# Patient Record
Sex: Male | Born: 1960 | Race: White | Hispanic: No | Marital: Married | Smoking: Current every day smoker
Health system: Southern US, Community
[De-identification: ages and names within clinical notes are randomized; demographics above are authoritative.]

## PROBLEM LIST (undated history)

## (undated) DIAGNOSIS — Z8489 Family history of other specified conditions: Secondary | ICD-10-CM

## (undated) DIAGNOSIS — Z87442 Personal history of urinary calculi: Secondary | ICD-10-CM

## (undated) DIAGNOSIS — K219 Gastro-esophageal reflux disease without esophagitis: Secondary | ICD-10-CM

## (undated) DIAGNOSIS — M1990 Unspecified osteoarthritis, unspecified site: Secondary | ICD-10-CM

## (undated) HISTORY — PX: TENDON REPAIR: SHX5111

## (undated) HISTORY — PX: FACIAL LACERATION REPAIR: SHX6589

---

## 2001-05-15 ENCOUNTER — Emergency Department (HOSPITAL_COMMUNITY): Admission: EM | Admit: 2001-05-15 | Discharge: 2001-05-15 | Payer: Self-pay | Admitting: Emergency Medicine

## 2003-05-09 ENCOUNTER — Emergency Department (HOSPITAL_COMMUNITY): Admission: EM | Admit: 2003-05-09 | Discharge: 2003-05-09 | Payer: Self-pay | Admitting: Emergency Medicine

## 2003-10-30 ENCOUNTER — Emergency Department (HOSPITAL_COMMUNITY): Admission: EM | Admit: 2003-10-30 | Discharge: 2003-10-30 | Payer: Self-pay | Admitting: Emergency Medicine

## 2003-11-13 ENCOUNTER — Ambulatory Visit (HOSPITAL_COMMUNITY): Admission: RE | Admit: 2003-11-13 | Discharge: 2003-11-13 | Payer: Self-pay | Admitting: Unknown Physician Specialty

## 2003-12-08 ENCOUNTER — Encounter (HOSPITAL_COMMUNITY): Admission: RE | Admit: 2003-12-08 | Discharge: 2004-01-07 | Payer: Self-pay | Admitting: Orthopedic Surgery

## 2004-02-15 ENCOUNTER — Ambulatory Visit (HOSPITAL_BASED_OUTPATIENT_CLINIC_OR_DEPARTMENT_OTHER): Admission: RE | Admit: 2004-02-15 | Discharge: 2004-02-15 | Payer: Self-pay | Admitting: Orthopedic Surgery

## 2004-02-15 ENCOUNTER — Ambulatory Visit (HOSPITAL_COMMUNITY): Admission: RE | Admit: 2004-02-15 | Discharge: 2004-02-15 | Payer: Self-pay | Admitting: Orthopedic Surgery

## 2004-03-06 ENCOUNTER — Encounter (HOSPITAL_COMMUNITY): Admission: RE | Admit: 2004-03-06 | Discharge: 2004-04-05 | Payer: Self-pay | Admitting: Orthopedic Surgery

## 2004-09-26 ENCOUNTER — Emergency Department (HOSPITAL_COMMUNITY): Admission: EM | Admit: 2004-09-26 | Discharge: 2004-09-27 | Payer: Self-pay | Admitting: *Deleted

## 2007-03-17 ENCOUNTER — Ambulatory Visit: Payer: Self-pay | Admitting: Gastroenterology

## 2007-11-10 DIAGNOSIS — Z87442 Personal history of urinary calculi: Secondary | ICD-10-CM

## 2007-11-10 DIAGNOSIS — R12 Heartburn: Secondary | ICD-10-CM

## 2007-11-10 DIAGNOSIS — R630 Anorexia: Secondary | ICD-10-CM

## 2008-06-20 ENCOUNTER — Ambulatory Visit (HOSPITAL_COMMUNITY): Admission: RE | Admit: 2008-06-20 | Discharge: 2008-06-20 | Payer: Self-pay | Admitting: Sports Medicine

## 2009-07-26 ENCOUNTER — Emergency Department (HOSPITAL_COMMUNITY): Admission: EM | Admit: 2009-07-26 | Discharge: 2009-07-26 | Payer: Self-pay | Admitting: Family Medicine

## 2010-04-28 ENCOUNTER — Emergency Department (HOSPITAL_COMMUNITY): Admission: EM | Admit: 2010-04-28 | Discharge: 2010-04-28 | Payer: Self-pay | Admitting: Family Medicine

## 2010-12-17 NOTE — Assessment & Plan Note (Signed)
[facility]                         GASTROENTEROLOGY OFFICE NOTE   Marcus Hayes, Marcus Hayes                       MRN:          [id number]  DATE:03/17/2007                            DOB:          1961/06/06    PROBLEM:  Early satiety and anorexia.   REASON:  Marcus Hayes is a 50 year old white male referred through the  courtesy of Dr. Nobie Putnam for evaluation.  For approximately a 2 week  period, 2 weeks ago he was experiencing profound anorexia.  He was  complaining of early satiety.  Despite this, weight was stable.  Over  the last week he feels his symptoms are improving.  He is without nausea  or abdominal pain.  He is moving his bowels regularly.  He does  experience frequent pyrosis, especially at night.  This has been a  problem for years, for which he takes over-the-counter Pepcid.  He is on  no gastric irritants including nonsteroidals.  He denies dysphagia.  He  denies hoarseness, cough, sore throat or shortness of breath.   PAST MEDICAL HISTORY:  Pertinent for kidney stones.   FAMILY HISTORY:  Noncontributory.   He is on no medications.  There is no allergies.  He smokes 1/2 pack a  day and has about 2 beers a day.  He is married and works in an [employer].   REVIEW OF SYSTEMS:  Is pertinent for back pain.   PHYSICAL EXAMINATION:  Pulse 92, blood pressure 124/86, weight 233.  HEENT:  EOMI. PERRLA. Sclerae are anicteric.  Conjunctivae are pink.  NECK:  Supple without thyromegaly, adenopathy or carotid bruits.  CHEST:  Clear to auscultation and percussion without adventitious  sounds.  CARDIAC:  Regular rhythm; normal S1 S2.  There are no murmurs, gallops  or rubs.  ABDOMEN:  Bowel sounds are normoactive.  Abdomen is soft, non-tender and  non-distended.  There are no abdominal masses, tenderness, splenic  enlargement or hepatomegaly.  EXTREMITIES:  Full range of motion.  No cyanosis, clubbing or edema.  RECTAL:  Deferred.   IMPRESSION:  Two week period of anorexia and early satiety.  I suspect  symptoms are related to chronic gastroesophageal reflux.   RECOMMENDATION:  1. Begin omeprazole 20 mg a day.  2. Upper endoscopy to rule out Barrett's esophagus.     Barbette Hair. Arlyce Dice, MD,FACG  Electronically Signed    RDK/MedQ  DD: 03/17/2007  DT: 03/18/2007  Job #: [id number]   cc:   Patrica Duel, M.D.

## 2010-12-17 NOTE — Letter (Signed)
March 17, 2007    Marcus Hayes   RE:  REINHART, SAULTERS  MRN:  [id number]  /  DOB:  1961-08-02   Dear Mr. Marcus Hayes:   It is my pleasure to have treated you recently as a new patient in my  office.  I appreciate your confidence and the opportunity to participate  in your care.   Since I do have a busy inpatient endoscopy schedule and office schedule,  my office hours vary weekly.  I am, however, available for emergency  calls every day through my office.  If I cannot promptly meet an urgent  office appointment, another one of our gastroenterologists will be able  to assist you.   My well-trained staff are prepared to help you at all times.  For  emergencies after office hours, a physician from our gastroenterology  section is always available through my 24-hour answering service.   While you are under my care, I encourage discussion of your questions  and concerns, and I will be happy to return your calls as soon as I am  available.   Once again, I welcome you as a new patient and I look forward to a happy  and healthy relationship.    Sincerely,      Barbette Hair. Arlyce Dice, MD,FACG  Electronically Signed   RDK/MedQ  DD: 03/17/2007  DT: 03/18/2007  Job #: [id number]

## 2010-12-17 NOTE — Letter (Signed)
March 17, 2007    Patrica Duel, M.D.  [address], [address]  [place], [place] [address]   RE:  Marcus Hayes, Marcus Hayes  MRN:  [id number]  /  DOB:  11/10/1960   Dear Dr. Nobie Putnam:   Upon your kind referral, I had the pleasure of evaluating your patient  and I am pleased to offer my findings.  I saw Mr. Rutigliano in the office  today.  Enclosed is a copy of my progress note that details my findings  and recommendations.   Thank you for the opportunity to participate in your patient's care.    Sincerely,      Barbette Hair. Arlyce Dice, MD,FACG  Electronically Signed    RDK/MedQ  DD: 03/17/2007  DT: 03/18/2007  Job #: [id number]

## 2010-12-20 NOTE — Op Note (Signed)
NAME:  Marcus Hayes, Marcus Hayes                          ACCOUNT NO.:  [id number]   MEDICAL RECORD NO.:  [id number]                   PATIENT TYPE:  AMB   LOCATION:  DSC                                  FACILITY:  [facility]   PHYSICIAN:  Loreta Ave, M.D.              DATE OF BIRTH:  [birth year]   DATE OF PROCEDURE:  02/15/2004  DATE OF DISCHARGE:                                 OPERATIVE REPORT   PREOPERATIVE DIAGNOSES:  Post traumatic lateral epicondylitis with tearing,  extensor carpi radialis brevis tendon.   POSTOPERATIVE DIAGNOSES:  Post traumatic lateral epicondylitis with tearing,  extensor carpi radialis brevis tendon.   OPERATION PERFORMED:  Exploration and extensive debridement, extensor carpi  radialis brevis tendon, right elbow.  Debridement drilling, lateral  epicondyle and then primary repair extensor carpi radialis brevis to  superficial extensors.   SURGEON:  Loreta Ave, M.D.   ASSISTANT:  Arlys John D. Petrarca, P.A.-C.   ANESTHESIA:  General.   ESTIMATED BLOOD LOSS:  Minimal.   SPECIMENS:  None.   CULTURES:  None.   COMPLICATIONS:  None.   DRESSING:  Soft compressive with sugar tong splint.   TOURNIQUET TIME:  45 minutes.   DESCRIPTION OF PROCEDURE:  The patient was brought to the operating room and  placed on the operating table in supine position.  After adequate anesthesia  had been obtained, tourniquet applied to the upper aspect of the right arm.  Prepped and draped in the usual sterile fashion.  Exsanguinated with  elevation and Esmarch.  Tourniquet inflated to 250 mmHg.  Longitudinal  incision from the epicondyle extending distally.  Skin and subcutaneous  tissue divided.  Some superficial inflammatory bursal material debrided.  Superficial extensors intact and opened longitudinally from the epicondyle  down to the level of the radial head.  Extensor carpi radialis brevis tendon  exposed.  Marked mucinous degeneration and tearing over the proximal 1.5  cm.  All of the irregular nonfunctional tissue completely debrided.  The  underlying capsule was degenerative as well.  Superficial extensors debrided  on the bottom surface but were left intact.  The epicondyle was then  debrided and treated with multiple drilling for later healing.  Wound  irrigated.  The joint exposed and was intact without chondral changes.  The  superficial extensors were then repaired side-to-side with Vicryl  incorporating the ECRB tendon to the undersurface of the level of the  radial head.  Skin and subcutaneous tissue were closed with Vicryl.  Margins  of the wound injected with Marcaine.  Steri-Strips applied.  Sterile  compressive dressing applied.  Sugar tong splint applied.  Tourniquet  deflated and removed.  Anesthesia reversed.  Brought to recovery room.  Tolerated surgery well without complication.  Loreta Ave, M.D.    DFM/MEDQ  D:  02/15/2004  T:  02/15/2004  Job:  [id number]

## 2011-02-21 ENCOUNTER — Emergency Department (HOSPITAL_COMMUNITY)
Admission: EM | Admit: 2011-02-21 | Discharge: 2011-02-22 | Disposition: A | Discharge status: Home / Self Care | Payer: 59 | Attending: Emergency Medicine | Admitting: Emergency Medicine

## 2011-02-21 ENCOUNTER — Encounter: Payer: Self-pay | Admitting: *Deleted

## 2011-02-21 DIAGNOSIS — K0889 Other specified disorders of teeth and supporting structures: Secondary | ICD-10-CM

## 2011-02-21 DIAGNOSIS — K089 Disorder of teeth and supporting structures, unspecified: Secondary | ICD-10-CM | POA: Insufficient documentation

## 2011-02-21 DIAGNOSIS — F17200 Nicotine dependence, unspecified, uncomplicated: Secondary | ICD-10-CM | POA: Insufficient documentation

## 2011-02-21 NOTE — ED Notes (Signed)
Called x 1 with no answer to go to room 19

## 2011-02-21 NOTE — ED Notes (Signed)
Pt c/o upper left toothache pain x 2 days

## 2011-02-22 MED ORDER — IBUPROFEN 800 MG PO TABS: 800.0000 mg | ORAL_TABLET | Freq: Three times a day (TID) | ORAL | Status: AC

## 2011-02-22 MED ORDER — IBUPROFEN 800 MG PO TABS
800.0000 mg | ORAL_TABLET | Freq: Once | ORAL | Status: AC
  Administered 2011-02-22: 800 mg via ORAL
  Filled 2011-02-22: qty 1

## 2011-02-22 MED ORDER — HYDROCODONE-ACETAMINOPHEN 5-325 MG PO TABS: 1.0000 | ORAL_TABLET | ORAL | Status: AC | PRN

## 2011-02-22 MED ORDER — PENICILLIN V POTASSIUM 250 MG PO TABS
500.0000 mg | ORAL_TABLET | Freq: Once | ORAL | Status: AC
  Administered 2011-02-22: 500 mg via ORAL
  Filled 2011-02-22: qty 2

## 2011-02-22 MED ORDER — PENICILLIN V POTASSIUM 500 MG PO TABS: 500.0000 mg | ORAL_TABLET | Freq: Four times a day (QID) | ORAL | Status: AC

## 2011-02-22 MED ORDER — HYDROCODONE-ACETAMINOPHEN 5-325 MG PO TABS
2.0000 | ORAL_TABLET | Freq: Once | ORAL | Status: AC
  Administered 2011-02-22: 2 via ORAL
  Filled 2011-02-22: qty 2

## 2011-02-22 NOTE — ED Provider Notes (Signed)
History     Chief Complaint  Patient presents with  . Dental Pain   HPI Comments: Patient with tooth to the upper left with large filling that came out about 2 months ago. Has had pain intermittently which became worse over the last two days. Tonight pain and swelling became worse, no drainage. Throbbing pain localized to the tooth and gums. Denies fever, chills. Took 400 mg ibuprofen at 6 pm. Pain is a 7/10 down from a 10/10 earlier in the evening.  Patient is a 50 y.o. male presenting with tooth pain. The history is provided by the patient.  Dental PainThe primary symptoms include mouth pain and dental injury. The symptoms began 12 to 24 hours ago. The symptoms are worsening. The symptoms occur constantly.  Affected teeth include: 15/left upper second molar.  Additional symptoms include: dental sensitivity to temperature, gum swelling and facial swelling.    History reviewed. No pertinent past medical history.  Past Surgical History  Procedure Date  . Tendon repair     History reviewed. No pertinent family history.  History  Substance Use Topics  . Smoking status: Current Some Day Smoker  . Smokeless tobacco: Not on file  . Alcohol Use: No      Review of Systems  HENT: Positive for facial swelling.   All other systems reviewed and are negative.    Physical Exam  BP 143/80  Pulse 84  Temp(Src) 97.9 F (36.6 C) (Oral)  Resp 20  Ht 6' (1.829 m)  Wt 220 lb (99.791 kg)  BMI 29.84 kg/m2  SpO2 96%  Physical Exam  Nursing note and vitals reviewed. Constitutional: He is oriented to person, place, and time. He appears well-developed and well-nourished. He appears distressed.  HENT:  Head: Normocephalic and atraumatic.       Left upper molar with swelling to gum, possible early abscess. No drainage. Cavity area blackened.  Eyes: EOM are normal. Pupils are equal, round, and reactive to light.  Neck: Normal range of motion. Neck supple.  Cardiovascular: Normal rate and  normal heart sounds.   Pulmonary/Chest: Effort normal and breath sounds normal.  Neurological: He is alert and oriented to person, place, and time.  Skin: Skin is warm and dry.    ED Course  Procedures  MDM       Nicoletta Dress. Colon Branch, MD 02/22/11 [id number]

## 2011-02-22 NOTE — Progress Notes (Signed)
Patient with improved pain. Ready for discharge.

## 2011-10-02 ENCOUNTER — Emergency Department (HOSPITAL_COMMUNITY)
Admission: EM | Admit: 2011-10-02 | Discharge: 2011-10-02 | Disposition: A | Discharge status: Home / Self Care | Payer: 59 | Source: Home / Self Care | Attending: Family Medicine | Admitting: Family Medicine

## 2011-10-02 ENCOUNTER — Encounter (HOSPITAL_COMMUNITY): Payer: Self-pay | Admitting: Emergency Medicine

## 2011-10-02 DIAGNOSIS — J40 Bronchitis, not specified as acute or chronic: Secondary | ICD-10-CM

## 2011-10-02 MED ORDER — AZITHROMYCIN 250 MG PO TABS: 250.0000 mg | ORAL_TABLET | Freq: Every day | ORAL | Status: AC

## 2011-10-02 MED ORDER — BENZONATATE 100 MG PO CAPS: 100.0000 mg | ORAL_CAPSULE | Freq: Three times a day (TID) | ORAL | Status: DC

## 2011-10-02 MED ORDER — GUAIFENESIN-CODEINE 100-10 MG/5ML PO SYRP: 5.0000 mL | ORAL_SOLUTION | Freq: Four times a day (QID) | ORAL | Status: DC | PRN

## 2011-10-02 NOTE — ED Notes (Signed)
Onset 09/25/2011 of symptoms: coughing, chest congestion, sore throat, fever minimal last week.  Reports yellow/brown phlegm, minimal amounts.

## 2011-10-02 NOTE — ED Provider Notes (Signed)
History     CSN: [id number]  Arrival date & time 10/02/11  1515   First MD Initiated Contact with Patient 10/02/11 1516      Chief Complaint  Patient presents with  . URI    (Consider location/radiation/quality/duration/timing/severity/associated sxs/prior treatment) HPI Comments: Onset 1 wk with first nasal congestion that has now moved to his chest. Cough is non productive and worse at night , keeping him awake. Fever initially but resolved. No n/v. Taking otc meds with minimal relief.   The history is provided by the patient.    History reviewed. No pertinent past medical history.  Past Surgical History  Procedure Date  . Tendon repair     History reviewed. No pertinent family history.  History  Substance Use Topics  . Smoking status: Current Some Day Smoker  . Smokeless tobacco: Not on file  . Alcohol Use: No      Review of Systems  Constitutional: Negative.   HENT: Negative.   Respiratory: Positive for cough and wheezing.   Cardiovascular: Negative.   Gastrointestinal: Negative.   Genitourinary: Negative.   Musculoskeletal: Negative.     Allergies  Review of patient's allergies indicates no known allergies.  Home Medications   Current Outpatient Rx  Name Route Sig Dispense Refill  . DEXTROMETHORPHAN POLISTIREX ER 30 MG/5ML PO LQCR Oral Take 60 mg by mouth as needed.    Marland Kitchen ALKA-SELTZER PLUS DAY COLD/FLU PO Oral Take by mouth.    . GUAIFENESIN ER 600 MG PO TB12 Oral Take 1,200 mg by mouth 2 (two) times daily.    Marland Kitchen OVER THE COUNTER MEDICATION  Took old antibiotics.  Took amoxicillin one tablet twice each day.  Took this Thursday and Friday of last week    . NYQUIL PO Oral Take by mouth.    . AZITHROMYCIN 250 MG PO TABS Oral Take 1 tablet (250 mg total) by mouth daily. Take first 2 tablets together, then 1 every day until finished. 6 tablet 0  . BENZONATATE 100 MG PO CAPS Oral Take 1 capsule (100 mg total) by mouth every 8 (eight) hours. 21 capsule 0  .  GUAIFENESIN-CODEINE 100-10 MG/5ML PO SYRP Oral Take 5 mLs by mouth every 6 (six) hours as needed for cough. 120 mL 0    BP 133/92  Pulse 80  Temp(Src) 98 F (36.7 C) (Oral)  Resp 18  SpO2 96%  Physical Exam  Nursing note and vitals reviewed. Constitutional: He appears well-developed and well-nourished. No distress.  HENT:  Nose: Nose normal.  Mouth/Throat: Oropharynx is clear and moist.  Neck: Normal range of motion. Neck supple.  Cardiovascular: Normal rate and regular rhythm.   Pulmonary/Chest: Effort normal and breath sounds normal. He has no wheezes.  Lymphadenopathy:    He has no cervical adenopathy.  Skin: Skin is warm and dry.    ED Course  Procedures (including critical care time)  Labs Reviewed - No data to display No results found.   1. Bronchitis       MDM          Randa Spike, MD 10/02/11 1710

## 2011-10-02 NOTE — Discharge Instructions (Signed)
Tylenol or motrin as needed. Avoid caffeine and milk products. Avoid smoking . Follow up with your pcp or return if symptoms persist or worsen. Bronchitis Bronchitis is a problem of the air tubes leading to your lungs. This problem makes it hard for air to get in and out of the lungs. You may cough a lot because your air tubes are narrow. Going without care can cause lasting (chronic) bronchitis. HOME CARE   Drink enough fluids to keep your pee (urine) clear or pale yellow.   Use a cool mist humidifier.   Quit smoking if you smoke. If you keep smoking, the bronchitis might not get better.   Only take medicine as told by your doctor.  GET HELP RIGHT AWAY IF:   Coughing keeps you awake.   You start to wheeze.   You become more sick or weak.   You have a hard time breathing or get short of breath.   You cough up blood.   Coughing lasts more than 2 weeks.   You have a fever.   Your baby is older than 3 months with a rectal temperature of 102 F (38.9 C) or higher.   Your baby is 60 months old or younger with a rectal temperature of 100.4 F (38 C) or higher.  MAKE SURE YOU:  Understand these instructions.   Will watch your condition.   Will get help right away if you are not doing well or get worse.  Document Released: 01/07/2008 Document Revised: 04/02/2011 Document Reviewed: 06/22/2009 [facility] Patient Information 2012 [employer], [place].

## 2011-10-02 NOTE — ED Notes (Signed)
Late entry.5:10 p.m.  Called in medications to [facility] .  Spoke to [place] in pharmacy.  #1 z pac, #2 cheratussin ac, 6 ounces, 1-2 tsp every 6 hours, #3 tessalon pearles #20 , 200mg  every 12 hours as needed for cough.

## 2011-10-09 ENCOUNTER — Encounter (HOSPITAL_COMMUNITY): Payer: Self-pay | Admitting: Emergency Medicine

## 2011-10-09 ENCOUNTER — Emergency Department (HOSPITAL_COMMUNITY)
Admission: EM | Admit: 2011-10-09 | Discharge: 2011-10-09 | Disposition: A | Discharge status: Home Health | Payer: 59 | Source: Home / Self Care

## 2011-10-09 DIAGNOSIS — M109 Gout, unspecified: Secondary | ICD-10-CM

## 2011-10-09 MED ORDER — NAPROXEN 500 MG PO TABS: 500.0000 mg | ORAL_TABLET | Freq: Two times a day (BID) | ORAL | Status: AC

## 2011-10-09 MED ORDER — COLCHICINE 0.6 MG PO TABS: 0.6000 mg | ORAL_TABLET | Freq: Two times a day (BID) | ORAL | Status: DC

## 2011-10-09 NOTE — ED Provider Notes (Signed)
History     CSN: [id number]  Arrival date & time 10/09/11  0910   None     Chief Complaint  Patient presents with  . Gout    (Consider location/radiation/quality/duration/timing/severity/associated sxs/prior treatment) HPI Comments: Patient presents today with c/o gout flare up of his Rt great toe that began yesterday. He has a hx of gout that began approx 3 yrs ago. He has had prescriptions of colchicine and Naprosyn that he takes prn when he begins to notice discomfort. He states often with just one dose of each medications it takes the pain away. He currently does not have a PCP, but has an upcoming appt to establish care with a new Dr. He does not drink alcohol, and denies change in diet.    Past Medical History  Diagnosis Date  . Gout     Past Surgical History  Procedure Date  . Tendon repair     History reviewed. No pertinent family history.  History  Substance Use Topics  . Smoking status: Current Some Day Smoker  . Smokeless tobacco: Not on file  . Alcohol Use: No      Review of Systems  Constitutional: Negative for fever and chills.  Musculoskeletal: Positive for joint swelling.  Skin: Positive for color change. Negative for wound.  Neurological: Negative for numbness.    Allergies  Review of patient's allergies indicates no known allergies.  Home Medications   Current Outpatient Rx  Name Route Sig Dispense Refill  . COLCHICINE 0.6 MG PO TABS Oral Take 1 tablet (0.6 mg total) by mouth 2 (two) times daily. 30 tablet 0  . NAPROXEN 500 MG PO TABS Oral Take 1 tablet (500 mg total) by mouth 2 (two) times daily. 30 tablet 0    BP 111/77  Pulse 84  Temp(Src) 97.8 F (36.6 C) (Oral)  Resp 18  SpO2 97%  Physical Exam  Constitutional: He appears well-developed and well-nourished. No distress.  HENT:  Head: Normocephalic and atraumatic.  Right Ear: Tympanic membrane, external ear and ear canal normal.  Left Ear: Tympanic membrane, external ear and ear  canal normal.  Nose: Nose normal.  Mouth/Throat: Uvula is midline, oropharynx is clear and moist and mucous membranes are normal. No oropharyngeal exudate, posterior oropharyngeal edema or posterior oropharyngeal erythema.  Neck: Neck supple.  Cardiovascular: Normal rate, regular rhythm and normal heart sounds.   Pulses:      Dorsalis pedis pulses are 2+ on the right side.       Posterior tibial pulses are 2+ on the right side.  Pulmonary/Chest: Effort normal and breath sounds normal. No respiratory distress.  Musculoskeletal:       Right foot: He exhibits tenderness and swelling. He exhibits normal capillary refill and no deformity.       Feet:  Lymphadenopathy:    He has no cervical adenopathy.  Neurological: He is alert.  Skin: Skin is warm and dry.  Psychiatric: He has a normal mood and affect.    ED Course  Procedures (including critical care time)   Labs Reviewed  URIC ACID   No results found.   1. Gout attack       MDM  Discussed with pt to f/u with new PCP regarding gout mgmt and may need preventive treatment.         Melody Comas, [place] 10/09/11 1108

## 2011-10-09 NOTE — ED Notes (Signed)
Here with gout flare up in right foot great toe that started yesterday.pain with movement only .stabbng,sharp pain.pt states last flare up 2010.ran out of COLCHINE 0.6MG  TAB AND NAPROSYN 500MG 

## 2011-10-09 NOTE — Discharge Instructions (Signed)
Gout Gout is an inflammatory condition (arthritis) caused by a buildup of uric acid crystals in the joints. Uric acid is a chemical that is normally present in the blood. Under some circumstances, uric acid can form into crystals in your joints. This causes joint redness, soreness, and swelling (inflammation). Repeat attacks are common. Over time, uric acid crystals can form into masses (tophi) near a joint, causing disfigurement. Gout is treatable and often preventable. CAUSES  The disease begins with elevated levels of uric acid in the blood. Uric acid is produced by your body when it breaks down a naturally found substance called purines. This also happens when you eat certain foods such as meats and fish. Causes of an elevated uric acid level include:  Being passed down from parent to child (heredity).   Diseases that cause increased uric acid production (obesity, psoriasis, some cancers).   Excessive alcohol use.   Diet, especially diets rich in meat and seafood.   Medicines, including certain cancer-fighting drugs (chemotherapy), diuretics, and aspirin.   Chronic kidney disease. The kidneys are no longer able to remove uric acid well.   Problems with metabolism.  Conditions strongly associated with gout include:  Obesity.   High blood pressure.   High cholesterol.   Diabetes.  Not everyone with elevated uric acid levels gets gout. It is not understood why some people get gout and others do not. Surgery, joint injury, and eating too much of certain foods are some of the factors that can lead to gout. SYMPTOMS   An attack of gout comes on quickly. It causes intense pain with redness, swelling, and warmth in a joint.   Fever can occur.   Often, only one joint is involved. Certain joints are more commonly involved:   Base of the big toe.   Knee.   Ankle.   Wrist.   Finger.  Without treatment, an attack usually goes away in a few days to weeks. Between attacks, you  usually will not have symptoms, which is different from many other forms of arthritis. DIAGNOSIS  Your caregiver will suspect gout based on your symptoms and exam. Removal of fluid from the joint (arthrocentesis) is done to check for uric acid crystals. Your caregiver will give you a medicine that numbs the area (local anesthetic) and use a needle to remove joint fluid for exam. Gout is confirmed when uric acid crystals are seen in joint fluid, using a special microscope. Sometimes, blood, urine, and X-ray tests are also used. TREATMENT  There are 2 phases to gout treatment: treating the sudden onset (acute) attack and preventing attacks (prophylaxis). Treatment of an Acute Attack  Medicines are used. These include anti-inflammatory medicines or steroid medicines.   An injection of steroid medicine into the affected joint is sometimes necessary.   The painful joint is rested. Movement can worsen the arthritis.   You may use warm or cold treatments on painful joints, depending which works best for you.   Discuss the use of coffee, vitamin C, or cherries with your caregiver. These may be helpful treatment options.  Treatment to Prevent Attacks After the acute attack subsides, your caregiver may advise prophylactic medicine. These medicines either help your kidneys eliminate uric acid from your body or decrease your uric acid production. You may need to stay on these medicines for a very long time. The early phase of treatment with prophylactic medicine can be associated with an increase in acute gout attacks. For this reason, during the first few months   of treatment, your caregiver may also advise you to take medicines usually used for acute gout treatment. Be sure you understand your caregiver's directions. You should also discuss dietary treatment with your caregiver. Certain foods such as meats and fish can increase uric acid levels. Other foods such as dairy can decrease levels. Your caregiver  can give you a list of foods to avoid. HOME CARE INSTRUCTIONS   Do not take aspirin to relieve pain. This raises uric acid levels.   Only take over-the-counter or prescription medicines for pain, discomfort, or fever as directed by your caregiver.   Rest the joint as much as possible. When in bed, keep sheets and blankets off painful areas.   Keep the affected joint raised (elevated).   Use crutches if the painful joint is in your leg.   Drink enough water and fluids to keep your urine clear or pale yellow. This helps your body get rid of uric acid. Do not drink alcoholic beverages. They slow the passage of uric acid.   Follow your caregiver's dietary instructions. Pay careful attention to the amount of protein you eat. Your daily diet should emphasize fruits, vegetables, whole grains, and fat-free or low-fat milk products.   Maintain a healthy body weight.  SEEK MEDICAL CARE IF:   You have an oral temperature above 102 F (38.9 C).   You develop diarrhea, vomiting, or any side effects from medicines.   You do not feel better in 24 hours, or you are getting worse.  SEEK IMMEDIATE MEDICAL CARE IF:   Your joint becomes suddenly more tender and you have:   Chills.   An oral temperature above 102 F (38.9 C), not controlled by medicine.  MAKE SURE YOU:   Understand these instructions.   Will watch your condition.   Will get help right away if you are not doing well or get worse.  Document Released: 07/18/2000 Document Revised: 07/10/2011 Document Reviewed: 10/29/2009 [employer] Patient Information 2012 [employer]. 

## 2011-10-10 ENCOUNTER — Telehealth (HOSPITAL_COMMUNITY): Payer: Self-pay | Admitting: *Deleted

## 2011-10-10 NOTE — ED Notes (Signed)
Pt. verified x 2 and given results. Pt. notified that this indicates he has gout. States he has had gout before. I said the doctors usually recommend that he f/u with a PCP for long term tx. I asked if he has a PCP. He said his wife just got one for him. He wrote down the result and said he would tell his PCP about it. Vassie Moselle 10/10/2011

## 2011-10-23 NOTE — ED Provider Notes (Signed)
Medical screening examination/treatment/procedure(s) were performed by non-physician practitioner and as supervising physician I was immediately available for consultation/collaboration.  Keygan Dumond G  D.O.    Randa Spike, MD 10/23/11 [phone]

## 2012-11-13 ENCOUNTER — Encounter (HOSPITAL_COMMUNITY): Payer: Self-pay | Admitting: *Deleted

## 2012-11-13 ENCOUNTER — Emergency Department (INDEPENDENT_AMBULATORY_CARE_PROVIDER_SITE_OTHER)
Admission: EM | Admit: 2012-11-13 | Discharge: 2012-11-13 | Disposition: A | Discharge status: Home / Self Care | Payer: Commercial Managed Care - PPO | Source: Home / Self Care | Attending: Family Medicine | Admitting: Family Medicine

## 2012-11-13 DIAGNOSIS — L259 Unspecified contact dermatitis, unspecified cause: Secondary | ICD-10-CM

## 2012-11-13 MED ORDER — FLUTICASONE PROPIONATE 0.05 % EX CREA: TOPICAL_CREAM | Freq: Two times a day (BID) | CUTANEOUS | Status: DC

## 2012-11-13 NOTE — ED Notes (Signed)
Pt  Has  A  Rash  l  Lower  Leg          X  2  Weeks   Rash  Itches            Small  Red   Areas   Present       No    Known  Causative  Agent         denys  Any  New  Medications  Or  Any  injury

## 2012-11-13 NOTE — ED Provider Notes (Signed)
History     CSN: [id number]  Arrival date & time 11/13/12  1425   First MD Initiated Contact with Patient 11/13/12 1429      Chief Complaint  Patient presents with  . Rash    (Consider location/radiation/quality/duration/timing/severity/associated sxs/prior treatment) Patient is a 52 y.o. male presenting with rash. The history is provided by the patient and the spouse.  Rash Location:  Leg Leg rash location:  L lower leg Quality: dryness, itchiness, redness and scaling   Quality: not painful   Severity:  Mild Chronicity:  New Ineffective treatments:  None tried   Past Medical History  Diagnosis Date  . Gout     Past Surgical History  Procedure Laterality Date  . Tendon repair      No family history on file.  History  Substance Use Topics  . Smoking status: Current Some Day Smoker  . Smokeless tobacco: Not on file  . Alcohol Use: Yes      Review of Systems  Constitutional: Negative.   Musculoskeletal: Negative.   Skin: Positive for rash.    Allergies  Review of patient's allergies indicates no known allergies.  Home Medications   Current Outpatient Rx  Name  Route  Sig  Dispense  Refill  . fluticasone (CUTIVATE) 0.05 % cream   Topical   Apply topically 2 (two) times daily.   60 g   0     There were no vitals taken for this visit.  Physical Exam  Nursing note and vitals reviewed. Constitutional: He is oriented to person, place, and time. He appears well-developed and well-nourished.  Musculoskeletal: Normal range of motion.  Neurological: He is alert and oriented to person, place, and time.  Skin: Skin is warm and dry. Rash noted. No pallor.  Dry erythematous pruritic excoriated patch on left ant tibial aspect of lower leg, no infection,  Psychiatric: He has a normal mood and affect.    ED Course  Procedures (including critical care time)  Labs Reviewed - No data to display No results found.   1. Contact dermatitis       MDM           Linna Hoff, MD 11/15/12 1820

## 2012-12-05 ENCOUNTER — Encounter (HOSPITAL_COMMUNITY): Payer: Self-pay

## 2012-12-05 ENCOUNTER — Emergency Department (HOSPITAL_COMMUNITY)
Admission: EM | Admit: 2012-12-05 | Discharge: 2012-12-05 | Disposition: A | Discharge status: Home / Self Care | Payer: Commercial Managed Care - PPO | Attending: Emergency Medicine | Admitting: Emergency Medicine

## 2012-12-05 ENCOUNTER — Emergency Department (HOSPITAL_COMMUNITY): Payer: Commercial Managed Care - PPO

## 2012-12-05 DIAGNOSIS — S20229A Contusion of unspecified back wall of thorax, initial encounter: Secondary | ICD-10-CM | POA: Insufficient documentation

## 2012-12-05 DIAGNOSIS — Z23 Encounter for immunization: Secondary | ICD-10-CM | POA: Insufficient documentation

## 2012-12-05 DIAGNOSIS — W11XXXA Fall on and from ladder, initial encounter: Secondary | ICD-10-CM | POA: Insufficient documentation

## 2012-12-05 DIAGNOSIS — S20221A Contusion of right back wall of thorax, initial encounter: Secondary | ICD-10-CM

## 2012-12-05 DIAGNOSIS — Y929 Unspecified place or not applicable: Secondary | ICD-10-CM | POA: Insufficient documentation

## 2012-12-05 DIAGNOSIS — S93609A Unspecified sprain of unspecified foot, initial encounter: Secondary | ICD-10-CM | POA: Insufficient documentation

## 2012-12-05 DIAGNOSIS — Z862 Personal history of diseases of the blood and blood-forming organs and certain disorders involving the immune mechanism: Secondary | ICD-10-CM | POA: Insufficient documentation

## 2012-12-05 DIAGNOSIS — S51009A Unspecified open wound of unspecified elbow, initial encounter: Secondary | ICD-10-CM | POA: Insufficient documentation

## 2012-12-05 DIAGNOSIS — Y9389 Activity, other specified: Secondary | ICD-10-CM | POA: Insufficient documentation

## 2012-12-05 DIAGNOSIS — S51011A Laceration without foreign body of right elbow, initial encounter: Secondary | ICD-10-CM

## 2012-12-05 DIAGNOSIS — F17200 Nicotine dependence, unspecified, uncomplicated: Secondary | ICD-10-CM | POA: Insufficient documentation

## 2012-12-05 DIAGNOSIS — IMO0002 Reserved for concepts with insufficient information to code with codable children: Secondary | ICD-10-CM | POA: Insufficient documentation

## 2012-12-05 DIAGNOSIS — Z8639 Personal history of other endocrine, nutritional and metabolic disease: Secondary | ICD-10-CM | POA: Insufficient documentation

## 2012-12-05 DIAGNOSIS — S93602A Unspecified sprain of left foot, initial encounter: Secondary | ICD-10-CM

## 2012-12-05 MED ORDER — OXYCODONE-ACETAMINOPHEN 5-325 MG PO TABS: ORAL_TABLET | ORAL | Status: DC

## 2012-12-05 MED ORDER — TETANUS-DIPHTH-ACELL PERTUSSIS 5-2.5-18.5 LF-MCG/0.5 IM SUSP
0.5000 mL | Freq: Once | INTRAMUSCULAR | Status: AC
  Administered 2012-12-05: 0.5 mL via INTRAMUSCULAR
  Filled 2012-12-05: qty 0.5

## 2012-12-05 MED ORDER — LIDOCAINE HCL (PF) 1 % IJ SOLN
INTRAMUSCULAR | Status: AC
  Administered 2012-12-05: 5 mL via INTRADERMAL
  Filled 2012-12-05: qty 5

## 2012-12-05 MED ORDER — OXYCODONE-ACETAMINOPHEN 5-325 MG PO TABS
1.0000 | ORAL_TABLET | Freq: Once | ORAL | Status: AC
  Administered 2012-12-05: 1 via ORAL
  Filled 2012-12-05: qty 1

## 2012-12-05 MED ORDER — LIDOCAINE HCL (PF) 1 % IJ SOLN
5.0000 mL | Freq: Once | INTRAMUSCULAR | Status: AC
  Administered 2012-12-05: 5 mL via INTRADERMAL

## 2012-12-05 MED ORDER — CYCLOBENZAPRINE HCL 10 MG PO TABS: 10.0000 mg | ORAL_TABLET | Freq: Three times a day (TID) | ORAL | Status: DC | PRN

## 2012-12-05 NOTE — ED Provider Notes (Signed)
History     CSN: [id number]  Arrival date & time 12/05/12  1608   First MD Initiated Contact with Patient 12/05/12 1659      Chief Complaint  Patient presents with  . Fall  . Extremity Laceration    (Consider location/radiation/quality/duration/timing/severity/associated sxs/prior treatment) HPI Comments: Patient c/o fall from a ladder that occurred just PTA.  States the ladder slipped and he fell approx 9 ft.  He c/o pain to the left foot, right elbow , and low back.  Also c/o laceration to the right elbow.  He denies head injury, LOC, neck pain, headache, dizziness, vomiting or recent alcohol use.    Patient is a 52 y.o. male presenting with skin laceration. The history is provided by the patient.  Laceration Location:  Shoulder/arm Shoulder/arm laceration location:  R elbow Length (cm):  4 cm Depth:  Cutaneous Quality comment:  Flap Bleeding: controlled   Laceration mechanism:  Unable to specify Pain details:    Quality:  Aching   Severity:  Mild   Timing:  Constant   Progression:  Unchanged Foreign body present:  No foreign bodies Relieved by:  Nothing Worsened by:  Movement Ineffective treatments:  None tried Tetanus status:  Out of date   Past Medical History  Diagnosis Date  . Gout     Past Surgical History  Procedure Laterality Date  . Tendon repair      No family history on file.  History  Substance Use Topics  . Smoking status: Current Some Day Smoker    Types: Cigarettes  . Smokeless tobacco: Not on file  . Alcohol Use: Yes      Review of Systems  Constitutional: Negative for fever and chills.  HENT: Negative for facial swelling, neck pain and neck stiffness.   Eyes: Negative for visual disturbance.  Respiratory: Negative for chest tightness and shortness of breath.   Cardiovascular: Negative for chest pain.  Gastrointestinal: Negative for nausea, vomiting and abdominal pain.  Genitourinary: Negative for dysuria, hematuria, flank pain and  difficulty urinating.  Musculoskeletal: Positive for back pain, joint swelling and arthralgias. Negative for gait problem.  Skin: Positive for wound. Negative for color change.  Neurological: Negative for dizziness, syncope, facial asymmetry, speech difficulty, weakness, light-headedness, numbness and headaches.  Psychiatric/Behavioral: Negative for confusion.  All other systems reviewed and are negative.    Allergies  Review of patient's allergies indicates no known allergies.  Home Medications   Current Outpatient Rx  Name  Route  Sig  Dispense  Refill  . fluticasone (CUTIVATE) 0.05 % cream   Topical   Apply topically 2 (two) times daily.   60 g   0     BP 144/90  Pulse 86  Temp(Src) 98.1 F (36.7 C) (Oral)  Resp 20  Ht 6' (1.829 m)  Wt 240 lb (108.863 kg)  BMI 32.54 kg/m2  SpO2 100%  Physical Exam  Nursing note and vitals reviewed. Constitutional: He is oriented to person, place, and time. He appears well-developed and well-nourished. No distress.  HENT:  Head: Normocephalic and atraumatic.  Eyes: Conjunctivae and EOM are normal. Pupils are equal, round, and reactive to light.  Neck: Normal range of motion. Neck supple.  Cardiovascular: Normal rate, regular rhythm, normal heart sounds and intact distal pulses.   No murmur heard. Pulmonary/Chest: Effort normal and breath sounds normal. No respiratory distress. He exhibits no tenderness.  Abdominal: Soft. He exhibits no distension. There is no tenderness. There is no rebound and no guarding.  Musculoskeletal: He exhibits tenderness. He exhibits no edema.       Right elbow: He exhibits laceration. He exhibits normal range of motion, no swelling, no effusion and no deformity. No tenderness found. No radial head, no medial epicondyle and no lateral epicondyle tenderness noted.       Lumbar back: He exhibits tenderness and pain. He exhibits normal range of motion, no swelling, no deformity, no laceration and normal pulse.         Back:       Left foot: He exhibits tenderness. He exhibits normal range of motion, no bony tenderness, no swelling, normal capillary refill, no crepitus, no deformity and no laceration.  ttp of the lumbar spine and paraspinal muscles.  DP pulse brisk and symmetrical.  Distal sensation intact.  ttp of the lateral left foot.  No bony deformity, abrasion or edema.  superficial lac to right forearm.  Bleeding controlled.  Lac is distal to the joint.  Pt has full ROM of the elbow.  Radial pulse brisk, distal sensation intact  Neurological: He is alert and oriented to person, place, and time. No cranial nerve deficit or sensory deficit. He exhibits normal muscle tone. Coordination and gait normal.  Reflex Scores:      Patellar reflexes are 2+ on the right side and 2+ on the left side.      Achilles reflexes are 2+ on the right side and 2+ on the left side. Skin: Skin is warm and dry.  Psychiatric: His behavior is normal.    ED Course  Procedures (including critical care time)  Labs Reviewed - No data to display Dg Lumbar Spine Complete  12/05/2012  *RADIOLOGY REPORT*  Clinical Data: Fall and lumbar spine pain.  LUMBAR SPINE - COMPLETE 4+ VIEW  Comparison: 06/25/2011  Findings: AP, lateral and oblique images of the lumbar spine were obtained.  Again noted is mild irregularity of the L5 vertebral body which appears chronic.  Otherwise, the vertebral body heights are maintained.  No evidence for a fracture or dislocation.  T12 ribs may be hypoplastic.  IMPRESSION: No acute bony abnormality in the lumbar spine.   Original Report Authenticated By: Richarda Overlie, M.D.    Dg Elbow Complete Right  12/05/2012  *RADIOLOGY REPORT*  Clinical Data: Laceration to the olecranon. Fall from ladder.  RIGHT ELBOW - COMPLETE 3+ VIEW  Comparison: MRI of the elbow 11/13/2003  Findings: No evidence for an elbow joint effusion.  There is a well corticated calcification or ossification near the radial head which appears  chronic. Irregularity of the coronoid process suggests degenerative changes.  Enthesopathic changes involving the olecranon.  There is a lucency involving the enthesopathic changes. Small ossification along the medial aspect of the distal humerus.  IMPRESSION: No evidence for an acute fracture or dislocation.  Scattered calcifications around the elbow.  These most likely represent chronic changes.  No evidence for an elbow effusion.   Original Report Authenticated By: Richarda Overlie, M.D.    Dg Foot Complete Left  12/05/2012  *RADIOLOGY REPORT*  Clinical Data: Fall with foot pain.  LEFT FOOT - COMPLETE 3+ VIEW  Comparison: None.  Findings: There is a small calcaneal spur.  No evidence for a fracture or dislocation.  Normal alignment of the left foot.  IMPRESSION: No acute bony abnormality.   Original Report Authenticated By: Richarda Overlie, M.D.        MDM    LACERATION REPAIR Performed by: Orin Eberwein L. Authorized by: Maxwell Caul Consent: Verbal consent  obtained. Risks and benefits: risks, benefits and alternatives were discussed Consent given by: patient Patient identity confirmed: provided demographic data Prepped and Draped in normal sterile fashion Wound explored  Laceration Location: right elbow Laceration Length: 4 cm  No Foreign Bodies seen or palpated  Anesthesia: local infiltration  Local anesthetic: lidocaine 1% w/o epinephrine  Anesthetic total: 3 ml  Irrigation method: syringe Amount of cleaning: standard  Skin closure: 4-0 ethilon Number of sutures:6 Technique: simple interrupted  Patient tolerance: Patient tolerated the procedure well with no immediate complications.   Lac was bandaged.  TDaP updated.  Pain improved after second percocet   Patient has ttp of the lumbar paraspinal muscles.  No focal neuro deficits on exam.  Ambulates with a steady gait.   No knee pain on exam.  VSS.  Patient is alert and talkative.  Appears stable for d/c.  Advised to return  here for any worsening sx's.  The patient appears reasonably screened and/or stabilized for discharge and I doubt any other medical condition or other [facility] requiring further screening, evaluation, or treatment in the ED at this time prior to discharge.    Kameria Canizares L. Linley Moskal, PA-C 12/08/12 2250

## 2012-12-05 NOTE — ED Notes (Signed)
Pt reports falling backwards off a ladder, pt fell approx 9 feet, now having left foot pain, back pain and laceration to right elbow.  Denies loc.

## 2012-12-12 NOTE — ED Provider Notes (Signed)
Medical screening examination/treatment/procedure(s) were performed by non-physician practitioner and as supervising physician I was immediately available for consultation/collaboration.  Kaytlen Lightsey, MD 12/12/12 1522 

## 2012-12-16 ENCOUNTER — Encounter: Payer: Self-pay | Admitting: Family Medicine

## 2012-12-16 ENCOUNTER — Ambulatory Visit (INDEPENDENT_AMBULATORY_CARE_PROVIDER_SITE_OTHER): Payer: Commercial Managed Care - PPO | Admitting: Family Medicine

## 2012-12-16 VITALS — Wt 240.6 lb

## 2012-12-16 DIAGNOSIS — S20221S Contusion of right back wall of thorax, sequela: Secondary | ICD-10-CM

## 2012-12-16 DIAGNOSIS — S51011S Laceration without foreign body of right elbow, sequela: Secondary | ICD-10-CM

## 2012-12-16 DIAGNOSIS — IMO0002 Reserved for concepts with insufficient information to code with codable children: Secondary | ICD-10-CM

## 2012-12-16 MED ORDER — OXYCODONE-ACETAMINOPHEN 5-325 MG PO TABS: ORAL_TABLET | ORAL | Status: DC

## 2012-12-16 NOTE — Progress Notes (Signed)
  Subjective:    Patient ID: Marcus Hayes, male    DOB: [birth year], 52 y.o.   MRN: [id number]  HPI This gentleman fell off a ladder striking his back his right elbow his left knee and left foot complains of pain and discomfort in all of these areas had a laceration of the right elbow multiple sutures were placed he's here today to have them removed. He denies dizziness nausea vomiting but he does have moderate ache and discomfort in his lower back does not radiate down the leg Past medical history benign   Review of Systems See above denies dizziness denies headaches    Objective:   Physical Exam Lungs are clear hearts regular laceration of the right elbow does appear to be healing a little bit of redness around it but no tenderness or drainage. He does have an enlargement of the right lower spine that is due to bruising       Assessment & Plan:  #1 low back contusion-the bruising will gradually get better will take up to 6 weeks to absorb followup if ongoing troubles #2 right elbow laceration sutures removed without difficulty I taught him what to watch for in terms of infection if that should occur he is to followup #3 he is to do lab work and a wellness checkup in the near future.

## 2014-04-16 ENCOUNTER — Encounter (HOSPITAL_COMMUNITY): Payer: Self-pay | Admitting: Emergency Medicine

## 2014-04-16 ENCOUNTER — Emergency Department (HOSPITAL_COMMUNITY)
Admission: EM | Admit: 2014-04-16 | Discharge: 2014-04-16 | Disposition: A | Discharge status: Home / Self Care | Payer: 59 | Attending: Emergency Medicine | Admitting: Emergency Medicine

## 2014-04-16 ENCOUNTER — Emergency Department (HOSPITAL_COMMUNITY): Payer: 59

## 2014-04-16 DIAGNOSIS — Z862 Personal history of diseases of the blood and blood-forming organs and certain disorders involving the immune mechanism: Secondary | ICD-10-CM | POA: Insufficient documentation

## 2014-04-16 DIAGNOSIS — Z8639 Personal history of other endocrine, nutritional and metabolic disease: Secondary | ICD-10-CM | POA: Insufficient documentation

## 2014-04-16 DIAGNOSIS — F17200 Nicotine dependence, unspecified, uncomplicated: Secondary | ICD-10-CM | POA: Insufficient documentation

## 2014-04-16 DIAGNOSIS — R0789 Other chest pain: Secondary | ICD-10-CM | POA: Insufficient documentation

## 2014-04-16 DIAGNOSIS — R1310 Dysphagia, unspecified: Secondary | ICD-10-CM | POA: Insufficient documentation

## 2014-04-16 DIAGNOSIS — R079 Chest pain, unspecified: Secondary | ICD-10-CM | POA: Insufficient documentation

## 2014-04-16 LAB — BASIC METABOLIC PANEL
Anion gap: 13 (ref 5–15)
BUN: 11 mg/dL (ref 6–23)
CALCIUM: 8.9 mg/dL (ref 8.4–10.5)
CO2: 23 meq/L (ref 19–32)
Chloride: 105 mEq/L (ref 96–112)
Creatinine, Ser: 1.14 mg/dL (ref 0.50–1.35)
GFR calc Af Amer: 84 mL/min — ABNORMAL LOW (ref >90)
GFR, EST NON AFRICAN AMERICAN: 73 mL/min — AB (ref >90)
GLUCOSE: 103 mg/dL — AB (ref 70–99)
Potassium: 3.6 mEq/L — ABNORMAL LOW (ref 3.7–5.3)
SODIUM: 141 meq/L (ref 137–147)

## 2014-04-16 LAB — CBC WITH DIFFERENTIAL/PLATELET
Basophils Absolute: 0 10*3/uL (ref 0.0–0.1)
Basophils Relative: 0 % (ref 0–1)
EOS ABS: 0.1 10*3/uL (ref 0.0–0.7)
EOS PCT: 1 % (ref 0–5)
HEMATOCRIT: 43.4 % (ref 39.0–52.0)
HEMOGLOBIN: 15.1 g/dL (ref 13.0–17.0)
LYMPHS ABS: 2.8 10*3/uL (ref 0.7–4.0)
LYMPHS PCT: 39 % (ref 12–46)
MCH: 31.1 pg (ref 26.0–34.0)
MCHC: 34.8 g/dL (ref 30.0–36.0)
MCV: 89.5 fL (ref 78.0–100.0)
MONOS PCT: 7 % (ref 3–12)
Monocytes Absolute: 0.5 10*3/uL (ref 0.1–1.0)
Neutro Abs: 4 10*3/uL (ref 1.7–7.7)
Neutrophils Relative %: 53 % (ref 43–77)
Platelets: 237 10*3/uL (ref 150–400)
RBC: 4.85 MIL/uL (ref 4.22–5.81)
RDW: 13.9 % (ref 11.5–15.5)
WBC: 7.4 10*3/uL (ref 4.0–10.5)

## 2014-04-16 LAB — TROPONIN I

## 2014-04-16 MED ORDER — ESOMEPRAZOLE MAGNESIUM 40 MG PO CPDR: 40.0000 mg | DELAYED_RELEASE_CAPSULE | Freq: Every day | ORAL | Status: DC

## 2014-04-16 MED ORDER — PANTOPRAZOLE SODIUM 40 MG PO TBEC
40.0000 mg | DELAYED_RELEASE_TABLET | Freq: Once | ORAL | Status: AC
  Administered 2014-04-16: 40 mg via ORAL
  Filled 2014-04-16: qty 1

## 2014-04-16 MED ORDER — GI COCKTAIL ~~LOC~~
30.0000 mL | Freq: Once | ORAL | Status: AC
  Administered 2014-04-16: 30 mL via ORAL
  Filled 2014-04-16: qty 30

## 2014-04-16 NOTE — ED Provider Notes (Addendum)
CSN: [id number]     Arrival date & time 04/16/14  2018 History   First MD Initiated Contact with Patient 04/16/14 2032     Chief Complaint  Patient presents with  . Chest Pain     (Consider location/radiation/quality/duration/timing/severity/associated sxs/prior Treatment) HPI Comments: Patient presents to the ER for evaluation of chest pain. Patient reports that he has been having intermittent episodes of chest discomfort for at least a week. Patient reports that he experiences burning in his upper chest when he swallows. The burning sensation lasts for about a minute and then resolves. He has noticed sometimes when he swallows it feels like it does not go down right away, except in his upper chest and then slowly goes down. He has never had food completely caught in his throat. There is no shortness of breath. Patient denied any vomiting or diarrhea. No diaphoresis.  Patient comes in tonight because his wife finally convinced him to come in. She is concerned because he is a smoker and he does have a family history of heart disease.  Patient is a 53 y.o. male presenting with chest pain.  Chest Pain   Past Medical History  Diagnosis Date  . Gout    Past Surgical History  Procedure Laterality Date  . Tendon repair     No family history on file. History  Substance Use Topics  . Smoking status: Current Some Day Smoker    Types: Cigarettes  . Smokeless tobacco: Not on file  . Alcohol Use: Yes    Review of Systems  Cardiovascular: Positive for chest pain.  All other systems reviewed and are negative.     Allergies  Review of patient's allergies indicates no known allergies.  Home Medications   Prior to Admission medications   Not on File   BP 91/64  Pulse 96  Temp(Src) 98.1 F (36.7 C) (Oral)  Resp 15  Ht 6' (1.829 m)  Wt 240 lb (108.863 kg)  BMI 32.54 kg/m2  SpO2 96% Physical Exam  Constitutional: He is oriented to person, place, and time. He appears  well-developed and well-nourished. No distress.  HENT:  Head: Normocephalic and atraumatic.  Right Ear: Hearing normal.  Left Ear: Hearing normal.  Nose: Nose normal.  Mouth/Throat: Oropharynx is clear and moist and mucous membranes are normal.  Eyes: Conjunctivae and EOM are normal. Pupils are equal, round, and reactive to light.  Neck: Normal range of motion. Neck supple.  Cardiovascular: Regular rhythm, S1 normal and S2 normal.  Exam reveals no gallop and no friction rub.   No murmur heard. Pulmonary/Chest: Effort normal and breath sounds normal. No respiratory distress. He exhibits no tenderness.  Abdominal: Soft. Normal appearance and bowel sounds are normal. There is no hepatosplenomegaly. There is no tenderness. There is no rebound, no guarding, no tenderness at McBurney's point and negative Murphy's sign. No hernia.  Musculoskeletal: Normal range of motion.  Neurological: He is alert and oriented to person, place, and time. He has normal strength. No cranial nerve deficit or sensory deficit. Coordination normal. GCS eye subscore is 4. GCS verbal subscore is 5. GCS motor subscore is 6.  Skin: Skin is warm, dry and intact. No rash noted. No cyanosis.  Psychiatric: He has a normal mood and affect. His speech is normal and behavior is normal. Thought content normal.    ED Course  Procedures (including critical care time) Labs Review Labs Reviewed  BASIC METABOLIC PANEL - Abnormal; Notable for the following:    Potassium 3.6 (*)  Glucose, Bld 103 (*)    GFR calc non Af Amer 73 (*)    GFR calc Af Amer 84 (*)    All other components within normal limits  CBC WITH DIFFERENTIAL  TROPONIN I    Imaging Review No results found.   EKG Interpretation   Date/Time:  Sunday April 16 2014 20:31:30 EDT Ventricular Rate:  99 PR Interval:  129 QRS Duration: 111 QT Interval:  364 QTC Calculation: 467 R Axis:   79 Text Interpretation:  Sinus rhythm Probable left atrial  enlargement Low  voltage, precordial leads Abnormal inferior Q waves No significant change  since last tracing Confirmed by POLLINA  MD, CHRISTOPHER [id number]) on  04/16/2014 8:41:59 PM      MDM   Final diagnoses:  None   atypical chest pain  Dysphagia  She presents to the ER for evaluation of chest pain. Patient is having extremely atypical symptoms. Patient reports a burning sensation and follow swallowing. Symptoms last for a minute and then resolve. He also endorses occasionally feeling like he cannot swallow his food, which gets caught in his esophagus. He does not have any evidence of impaction at this time. Patient's EKG is unchanged from previous. Troponin is negative. I believe that the patient requires hospitalization for any further cardiac workup. He can followup with his primary care doctor for further consideration. Symptoms appear to be GI in nature. He'll be initiated on Protonix and followup with GI.  Patient did have an episode of hypotension here in the ER. Etiology unclear. He was asymptomatic, improved with fluids. Counseled to follow up with primary doctor in the office.  Gilda Crease, MD 04/16/14 3295  Gilda Crease, MD 04/19/14 [phone]

## 2014-04-16 NOTE — ED Notes (Signed)
Pt reports intermittent chest pains x a couple of weeks. Pt states pain started tonight before dinner and is gone at this time. Pt denies any associated symptoms.

## 2014-04-16 NOTE — Discharge Instructions (Signed)
Chest Pain (Nonspecific) °It is often hard to give a specific diagnosis for the cause of chest pain. There is always a chance that your pain could be related to something serious, such as a heart attack or a blood clot in the lungs. You need to follow up with your health care provider for further evaluation. °CAUSES  °· Heartburn. °· Pneumonia or bronchitis. °· Anxiety or stress. °· Inflammation around your heart (pericarditis) or lung (pleuritis or pleurisy). °· A blood clot in the lung. °· A collapsed lung (pneumothorax). It can develop suddenly on its own (spontaneous pneumothorax) or from trauma to the chest. °· Shingles infection (herpes zoster virus). °The chest wall is composed of bones, muscles, and cartilage. Any of these can be the source of the pain. °· The bones can be bruised by injury. °· The muscles or cartilage can be strained by coughing or overwork. °· The cartilage can be affected by inflammation and become sore (costochondritis). °DIAGNOSIS  °Lab tests or other studies may be needed to find the cause of your pain. Your health care provider may have you take a test called an ambulatory electrocardiogram (ECG). An ECG records your heartbeat patterns over a 24-hour period. You may also have other tests, such as: °· Transthoracic echocardiogram (TTE). During echocardiography, sound waves are used to evaluate how blood flows through your heart. °· Transesophageal echocardiogram (TEE). °· Cardiac monitoring. This allows your health care provider to monitor your heart rate and rhythm in real time. °· Holter monitor. This is a portable device that records your heartbeat and can help diagnose heart arrhythmias. It allows your health care provider to track your heart activity for several days, if needed. °· Stress tests by exercise or by giving medicine that makes the heart beat faster. °TREATMENT  °· Treatment depends on what may be causing your chest pain. Treatment may include: °¨ Acid blockers for  heartburn. °¨ Anti-inflammatory medicine. °¨ Pain medicine for inflammatory conditions. °¨ Antibiotics if an infection is present. °· You may be advised to change lifestyle habits. This includes stopping smoking and avoiding alcohol, caffeine, and chocolate. °· You may be advised to keep your head raised (elevated) when sleeping. This reduces the chance of acid going backward from your stomach into your esophagus. °Most of the time, nonspecific chest pain will improve within 2-3 days with rest and mild pain medicine.  °HOME CARE INSTRUCTIONS  °· If antibiotics were prescribed, take them as directed. Finish them even if you start to feel better. °· For the next few days, avoid physical activities that bring on chest pain. Continue physical activities as directed. °· Do not use any tobacco products, including cigarettes, chewing tobacco, or electronic cigarettes. °· Avoid drinking alcohol. °· Only take medicine as directed by your health care provider. °· Follow your health care provider's suggestions for further testing if your chest pain does not go away. °· Keep any follow-up appointments you made. If you do not go to an appointment, you could develop lasting (chronic) problems with pain. If there is any problem keeping an appointment, call to reschedule. °SEEK MEDICAL CARE IF:  °· Your chest pain does not go away, even after treatment. °· You have a rash with blisters on your chest. °· You have a fever. °SEEK IMMEDIATE MEDICAL CARE IF:  °· You have increased chest pain or pain that spreads to your arm, neck, jaw, back, or abdomen. °· You have shortness of breath. °· You have an increasing cough, or you cough   up blood.  You have severe back or abdominal pain.  You feel nauseous or vomit.  You have severe weakness.  You faint.  You have chills. This is an emergency. Do not wait to see if the pain will go away. Get medical help at once. Call your local emergency services (911 in [place] Do not drive  yourself to the hospital. MAKE SURE YOU:   Understand these instructions.  Will watch your condition.  Will get help right away if you are not doing well or get worse. Document Released: 04/30/2005 Document Revised: 07/26/2013 Document Reviewed: 02/24/2008 [facility] Patient Information 2015 [place], [place]. This information is not intended to replace advice given to you by your health care provider. Make sure you discuss any questions you have with your health care provider.  Dysphagia Swallowing problems (dysphagia) occur when solids and liquids seem to stick in your throat on the way down to your stomach, or the food takes longer to get to the stomach. Other symptoms include regurgitating food, noises coming from the throat, chest discomfort with swallowing, and a feeling of fullness or the feeling of something being stuck in your throat when swallowing. When blockage in your throat is complete, it may be associated with drooling. CAUSES  Problems with swallowing may occur because of problems with the muscles. The food cannot be propelled in the usual manner into your stomach. You may have ulcers, scar tissue, or inflammation in the tube down which food travels from your mouth to your stomach (esophagus), which blocks food from passing normally into the stomach. Causes of inflammation include:  Acid reflux from your stomach into your esophagus.  Infection.  Radiation treatment for cancer.  Medicines taken without enough fluids to wash them down into your stomach. You may have nerve problems that prevent signals from being sent to the muscles of your esophagus to contract and move your food down to your stomach. Globus pharyngeus is a relatively common problem in which there is a sense of an obstruction or difficulty in swallowing, without any physical abnormalities of the swallowing passages being found. This problem usually improves over time with reassurance and testing to rule out other  causes. DIAGNOSIS Dysphagia can be diagnosed and its cause can be determined by tests in which you swallow a white substance that helps illuminate the inside of your throat (contrast medium) while X-rays are taken. Sometimes a flexible telescope that is inserted down your throat (endoscopy) to look at your esophagus and stomach is used. TREATMENT   If the dysphagia is caused by acid reflux or infection, medicines may be used.  If the dysphagia is caused by problems with your swallowing muscles, swallowing therapy may be used to help you strengthen your swallowing muscles.  If the dysphagia is caused by a blockage or mass, procedures to remove the blockage may be done. HOME CARE INSTRUCTIONS  Try to eat soft food that is easier to swallow and check your weight on a daily basis to be sure that it is not decreasing.  Be sure to drink liquids when sitting upright (not lying down). SEEK MEDICAL CARE IF:  You are losing weight because you are unable to swallow.  You are coughing when you drink liquids (aspiration).  You are coughing up partially digested food. SEEK IMMEDIATE MEDICAL CARE IF:  You are unable to swallow your own saliva .  You are having shortness of breath or a fever, or both.  You have a hoarse voice along with difficulty swallowing.  MAKE SURE YOU:  Understand these instructions.  Will watch your condition.  Will get help right away if you are not doing well or get worse. Document Released: 07/18/2000 Document Revised: 12/05/2013 Document Reviewed: 01/07/2013 [facility] Patient Information 2015 [place], [place]. This information is not intended to replace advice given to you by your health care provider. Make sure you discuss any questions you have with your health care provider.

## 2014-04-16 NOTE — ED Notes (Signed)
Male i room complaining about the time frame of labs and xrays. Explained to her that  It will  be done as soon possible.

## 2014-09-20 ENCOUNTER — Emergency Department (HOSPITAL_COMMUNITY): Payer: Self-pay

## 2014-09-20 ENCOUNTER — Emergency Department (HOSPITAL_COMMUNITY): Payer: Commercial Managed Care - PPO

## 2014-09-20 ENCOUNTER — Other Ambulatory Visit: Payer: Self-pay

## 2014-09-20 ENCOUNTER — Emergency Department (HOSPITAL_COMMUNITY)
Admission: EM | Admit: 2014-09-20 | Discharge: 2014-09-21 | Disposition: A | Discharge status: Home / Self Care | Payer: Commercial Managed Care - PPO | Attending: Emergency Medicine | Admitting: Emergency Medicine

## 2014-09-20 ENCOUNTER — Encounter (HOSPITAL_COMMUNITY): Payer: Self-pay | Admitting: Emergency Medicine

## 2014-09-20 DIAGNOSIS — Y998 Other external cause status: Secondary | ICD-10-CM | POA: Insufficient documentation

## 2014-09-20 DIAGNOSIS — S46912A Strain of unspecified muscle, fascia and tendon at shoulder and upper arm level, left arm, initial encounter: Secondary | ICD-10-CM | POA: Insufficient documentation

## 2014-09-20 DIAGNOSIS — Y9289 Other specified places as the place of occurrence of the external cause: Secondary | ICD-10-CM | POA: Insufficient documentation

## 2014-09-20 DIAGNOSIS — Z720 Tobacco use: Secondary | ICD-10-CM | POA: Insufficient documentation

## 2014-09-20 DIAGNOSIS — Z8739 Personal history of other diseases of the musculoskeletal system and connective tissue: Secondary | ICD-10-CM | POA: Insufficient documentation

## 2014-09-20 DIAGNOSIS — S46812A Strain of other muscles, fascia and tendons at shoulder and upper arm level, left arm, initial encounter: Secondary | ICD-10-CM

## 2014-09-20 DIAGNOSIS — Z79899 Other long term (current) drug therapy: Secondary | ICD-10-CM | POA: Insufficient documentation

## 2014-09-20 DIAGNOSIS — Y9389 Activity, other specified: Secondary | ICD-10-CM | POA: Insufficient documentation

## 2014-09-20 DIAGNOSIS — R0789 Other chest pain: Secondary | ICD-10-CM | POA: Insufficient documentation

## 2014-09-20 DIAGNOSIS — X58XXXA Exposure to other specified factors, initial encounter: Secondary | ICD-10-CM | POA: Insufficient documentation

## 2014-09-20 LAB — BASIC METABOLIC PANEL
Anion gap: 7 (ref 5–15)
BUN: 14 mg/dL (ref 6–23)
CALCIUM: 9.2 mg/dL (ref 8.4–10.5)
CHLORIDE: 109 mmol/L (ref 96–112)
CO2: 26 mmol/L (ref 19–32)
Creatinine, Ser: 1.13 mg/dL (ref 0.50–1.35)
GFR calc Af Amer: 85 mL/min — ABNORMAL LOW (ref >90)
GFR, EST NON AFRICAN AMERICAN: 73 mL/min — AB (ref >90)
Glucose, Bld: 119 mg/dL — ABNORMAL HIGH (ref 70–99)
POTASSIUM: 3.7 mmol/L (ref 3.5–5.1)
Sodium: 142 mmol/L (ref 135–145)

## 2014-09-20 LAB — CBC
HEMATOCRIT: 42.3 % (ref 39.0–52.0)
HEMOGLOBIN: 14.5 g/dL (ref 13.0–17.0)
MCH: 30.9 pg (ref 26.0–34.0)
MCHC: 34.3 g/dL (ref 30.0–36.0)
MCV: 90 fL (ref 78.0–100.0)
PLATELETS: 231 10*3/uL (ref 150–400)
RBC: 4.7 MIL/uL (ref 4.22–5.81)
RDW: 13.6 % (ref 11.5–15.5)
WBC: 7.9 10*3/uL (ref 4.0–10.5)

## 2014-09-20 LAB — TROPONIN I

## 2014-09-20 NOTE — ED Notes (Signed)
Pt reporting intermitent chest pain for past 2 weeks.  States pain is primarily in center of chest, but also reports pain on side of neck and tingling in left hand.  Pt states tingling began about 3 days ago.  Denies associated nausea or SOB.

## 2014-09-20 NOTE — ED Notes (Signed)
Pt states he has been having chest pain off and on for 2 weeks with radiation to L. Neck area and with numbness and tingling of L. Arm.

## 2014-09-21 MED ORDER — DIAZEPAM 5 MG/ML IJ SOLN
5.0000 mg | Freq: Once | INTRAMUSCULAR | Status: AC
  Administered 2014-09-21: 5 mg via INTRAMUSCULAR
  Filled 2014-09-21: qty 2

## 2014-09-21 MED ORDER — NAPROXEN 500 MG PO TABS: 500.0000 mg | ORAL_TABLET | Freq: Two times a day (BID) | ORAL | Status: DC

## 2014-09-21 MED ORDER — KETOROLAC TROMETHAMINE 60 MG/2ML IM SOLN
60.0000 mg | Freq: Once | INTRAMUSCULAR | Status: AC
  Administered 2014-09-21: 60 mg via INTRAMUSCULAR
  Filled 2014-09-21: qty 2

## 2014-09-21 MED ORDER — CYCLOBENZAPRINE HCL 5 MG PO TABS: 5.0000 mg | ORAL_TABLET | Freq: Three times a day (TID) | ORAL | Status: DC | PRN

## 2014-09-21 NOTE — Discharge Instructions (Signed)
Use ice and heat on the sore neck muscle.Take the medications as prescribed. You can be rechecked by Dr Gerda DissLuking if not improving in the next week. You can also let him know about your chest pain. With your family history, you should consider being evaluated by a cardiologist for your chest pain, call the office to get an appointment with a cardiologist. Return to the ED if you get chest pain that is constant and lasts more than 20 minutes.

## 2014-09-21 NOTE — ED Provider Notes (Signed)
CSN: [id number]     Arrival date & time 09/20/14  2031 History   First MD Initiated Contact with Patient 09/20/14 2302     Chief Complaint  Patient presents with  . Chest Pain     (Consider location/radiation/quality/duration/timing/severity/associated sxs/prior Treatment) HPI  Patient reports he started getting chest pain off and on for the past 6 months. He states he gets it about once monthly. He states his last episode was one week ago. He points to his lower central chest and he states it's a feeling like he is swallowing something and it swells up and gets stuck and then it shrinks and goes away. He states it lasts 1-2 minutes. He denies any association with eating food. It is not having food getting stuck and that is just the way the pain feels to him. He denies nausea, vomiting, or shortness of breath. Sometimes he breaks out in a sweat with the discomfort. Patient states his father is alive at age 66 and has had open heart surgery. His mother who is alive at age 21 has some type of heart problem he cannot characterize.  Patient also complains of pain in his left neck and shoulder for the past month. He states for the past 3 days he has a tingling sensation that radiates into his left hand, specifically his thumb and index finger. He states sometimes he gets discomfort in his right hand.   PCP Dr Gerda Diss  Past Medical History  Diagnosis Date  . Gout    Past Surgical History  Procedure Laterality Date  . Tendon repair     History reviewed. No pertinent family history. History  Substance Use Topics  . Smoking status: Current Some Day Smoker    Types: Cigarettes  . Smokeless tobacco: Not on file  . Alcohol Use: Yes  employed Lives with spouse  Review of Systems  All other systems reviewed and are negative.     Allergies  Review of patient's allergies indicates no known allergies.  Home Medications   Prior to Admission medications   Medication Sig Start Date End  Date Taking? Authorizing Provider  guaiFENesin (MUCINEX) 600 MG 12 hr tablet Take 600 mg by mouth daily as needed for cough.   Yes Historical Provider, MD  esomeprazole (NEXIUM) 40 MG capsule Take 1 capsule (40 mg total) by mouth daily. Patient not taking: Reported on 09/20/2014 04/16/14   Gilda Crease, MD   BP 119/77 mmHg  Pulse 79  Temp(Src) 98.2 F (36.8 C) (Oral)  Resp 14  Ht 6' (1.829 m)  Wt 240 lb (108.863 kg)  BMI 32.54 kg/m2  SpO2 97%  Vital signs normal   Physical Exam  Constitutional: He is oriented to person, place, and time. He appears well-developed and well-nourished.  Non-toxic appearance. He does not appear ill. No distress.  HENT:  Head: Normocephalic and atraumatic.  Right Ear: External ear normal.  Left Ear: External ear normal.  Nose: Nose normal. No mucosal edema or rhinorrhea.  Mouth/Throat: Oropharynx is clear and moist and mucous membranes are normal. No dental abscesses or uvula swelling.  Eyes: Conjunctivae and EOM are normal. Pupils are equal, round, and reactive to light.  Neck: Normal range of motion and full passive range of motion without pain. Neck supple.    Tender to palpation over the left trapezius muscle which also causes tingling into his left hand  Cardiovascular: Normal rate, regular rhythm and normal heart sounds.  Exam reveals no gallop and no friction rub.  No murmur heard. Pulmonary/Chest: Effort normal and breath sounds normal. No respiratory distress. He has no wheezes. He has no rhonchi. He has no rales. He exhibits no tenderness and no crepitus.    Area of pain noted  Abdominal: Soft. Normal appearance and bowel sounds are normal. He exhibits no distension. There is no tenderness. There is no rebound and no guarding.  Musculoskeletal: Normal range of motion. He exhibits no edema or tenderness.  Moves all extremities well.   Neurological: He is alert and oriented to person, place, and time. He has normal strength. No  cranial nerve deficit.  Skin: Skin is warm, dry and intact. No rash noted. No erythema. No pallor.  Psychiatric: He has a normal mood and affect. His speech is normal and behavior is normal. His mood appears not anxious.  Nursing note and vitals reviewed.   ED Course  Procedures (including critical care time)  Medications  ketorolac (TORADOL) injection 60 mg (60 mg Intramuscular Given 09/21/14 0054)  diazepam (VALIUM) injection 5 mg (5 mg Intramuscular Given 09/21/14 0054)    Recheck 00:45 pt sleeping, agreeable to getting IM meds.    Labs Review Results for orders placed or performed during the hospital encounter of 09/20/14  CBC  Result Value Ref Range   WBC 7.9 4.0 - 10.5 K/uL   RBC 4.70 4.22 - 5.81 MIL/uL   Hemoglobin 14.5 13.0 - 17.0 g/dL   HCT 04.5 40.9 - 81.1 %   MCV 90.0 78.0 - 100.0 fL   MCH 30.9 26.0 - 34.0 pg   MCHC 34.3 30.0 - 36.0 g/dL   RDW 91.4 78.2 - 95.6 %   Platelets 231 150 - 400 K/uL  Basic metabolic panel  Result Value Ref Range   Sodium 142 135 - 145 mmol/L   Potassium 3.7 3.5 - 5.1 mmol/L   Chloride 109 96 - 112 mmol/L   CO2 26 19 - 32 mmol/L   Glucose, Bld 119 (H) 70 - 99 mg/dL   BUN 14 6 - 23 mg/dL   Creatinine, Ser 2.13 0.50 - 1.35 mg/dL   Calcium 9.2 8.4 - 08.6 mg/dL   GFR calc non Af Amer 73 (L) >90 mL/min   GFR calc Af Amer 85 (L) >90 mL/min   Anion gap 7 5 - 15  Troponin I (MHP)  Result Value Ref Range   Troponin I <0.03 <0.031 ng/mL   Laboratory interpretation all normal    Imaging Review Dg Chest 2 View  09/20/2014   CLINICAL DATA:  Left-sided chest pain for 3 weeks radiating into the left arm and neck for 3 days.  EXAM: CHEST  2 VIEW  COMPARISON:  PA and lateral chest 04/16/2014.  FINDINGS: Heart size and mediastinal contours are within normal limits. Both lungs are clear. Visualized skeletal structures are unremarkable.  IMPRESSION: Negative exam.   Electronically Signed   By: Drusilla Kanner M.D.   On: 09/20/2014 23:17   Dg  Cervical Spine Complete  09/20/2014   CLINICAL DATA:  Numbness and tingling in the left side of the neck and left arm for 3 days. Initial encounter.  EXAM: CERVICAL SPINE  4+ VIEWS  COMPARISON:  Plain film cervical spine 10/30/2003.  FINDINGS: There is no evidence of cervical spine fracture or prevertebral soft tissue swelling. Alignment is normal. Intervertebral disc space height is maintained. The facet joints are unremarkable. No other significant bone abnormalities are identified.  IMPRESSION: Negative cervical spine radiographs.   Electronically Signed   By: Maisie Fus  Dalessio M.D.   On: 09/20/2014 23:50     EKG Interpretation   Date/Time:  Wednesday September 20 2014 20:33:46 EST Ventricular Rate:  83 PR Interval:  130 QRS Duration: 98 QT Interval:  388 QTC Calculation: 455 R Axis:   61 Text Interpretation:  Normal sinus rhythm Normal ECG Since last tracing  rate slower (16 Apr 2014) Confirmed by Jamiere Gulas  MD-I, Jazaria Jarecki ([id number]) on  09/20/2014 11:02:57 PM      MDM  patient describes atypical chest pain that he has had for the past 6 months. His last episode was a week ago. His EKG and troponin are normal. Delta troponin not needed because chest pain episode was several days ago. Patient also has tenderness over his left trapezius with normal chest x-ray without significant arthritis. I am going to treat his muscle pain and hopefully that will improve his radicular pain. If not he can follow-up with his PCP.    Final diagnoses:  Trapezius muscle strain, left, initial encounter  Atypical chest pain    New Prescriptions   CYCLOBENZAPRINE (FLEXERIL) 5 MG TABLET    Take 1 tablet (5 mg total) by mouth 3 (three) times daily as needed.   NAPROXEN (NAPROSYN) 500 MG TABLET    Take 1 tablet (500 mg total) by mouth 2 (two) times daily.    Plan discharge  Devoria AlbeIva Braidyn Scorsone, MD, Franz DellFACEP     Dhruvan Gullion L Meryl Hubers, MD 09/21/14 [phone]

## 2015-09-05 HISTORY — PX: EXTRACORPOREAL SHOCK WAVE LITHOTRIPSY: SHX1557

## 2015-09-06 ENCOUNTER — Emergency Department (HOSPITAL_COMMUNITY): Payer: 59

## 2015-09-06 ENCOUNTER — Emergency Department (HOSPITAL_COMMUNITY)
Admission: EM | Admit: 2015-09-06 | Discharge: 2015-09-06 | Disposition: A | Discharge status: Home / Self Care | Payer: 59 | Attending: Emergency Medicine | Admitting: Emergency Medicine

## 2015-09-06 ENCOUNTER — Encounter (HOSPITAL_COMMUNITY): Payer: Self-pay

## 2015-09-06 DIAGNOSIS — M109 Gout, unspecified: Secondary | ICD-10-CM | POA: Diagnosis not present

## 2015-09-06 DIAGNOSIS — N201 Calculus of ureter: Secondary | ICD-10-CM | POA: Diagnosis not present

## 2015-09-06 DIAGNOSIS — F17210 Nicotine dependence, cigarettes, uncomplicated: Secondary | ICD-10-CM | POA: Insufficient documentation

## 2015-09-06 DIAGNOSIS — K859 Acute pancreatitis without necrosis or infection, unspecified: Secondary | ICD-10-CM | POA: Insufficient documentation

## 2015-09-06 DIAGNOSIS — R079 Chest pain, unspecified: Secondary | ICD-10-CM | POA: Diagnosis not present

## 2015-09-06 DIAGNOSIS — F524 Premature ejaculation: Secondary | ICD-10-CM | POA: Diagnosis not present

## 2015-09-06 DIAGNOSIS — Z791 Long term (current) use of non-steroidal anti-inflammatories (NSAID): Secondary | ICD-10-CM | POA: Diagnosis not present

## 2015-09-06 DIAGNOSIS — R1013 Epigastric pain: Secondary | ICD-10-CM | POA: Diagnosis present

## 2015-09-06 DIAGNOSIS — Z0000 Encounter for general adult medical examination without abnormal findings: Secondary | ICD-10-CM | POA: Diagnosis not present

## 2015-09-06 LAB — CBC WITH DIFFERENTIAL/PLATELET
BASOS PCT: 0 %
Basophils Absolute: 0 10*3/uL (ref 0.0–0.1)
EOS ABS: 0.1 10*3/uL (ref 0.0–0.7)
Eosinophils Relative: 1 %
HEMATOCRIT: 41.9 % (ref 39.0–52.0)
HEMOGLOBIN: 14.3 g/dL (ref 13.0–17.0)
LYMPHS ABS: 3.5 10*3/uL (ref 0.7–4.0)
Lymphocytes Relative: 39 %
MCH: 30.8 pg (ref 26.0–34.0)
MCHC: 34.1 g/dL (ref 30.0–36.0)
MCV: 90.3 fL (ref 78.0–100.0)
MONO ABS: 0.8 10*3/uL (ref 0.1–1.0)
MONOS PCT: 9 %
NEUTROS ABS: 4.7 10*3/uL (ref 1.7–7.7)
NEUTROS PCT: 51 %
Platelets: 215 10*3/uL (ref 150–400)
RBC: 4.64 MIL/uL (ref 4.22–5.81)
RDW: 13.4 % (ref 11.5–15.5)
WBC: 9.1 10*3/uL (ref 4.0–10.5)

## 2015-09-06 LAB — TROPONIN I

## 2015-09-06 MED ORDER — ONDANSETRON 8 MG PO TBDP: 8.0000 mg | ORAL_TABLET | Freq: Three times a day (TID) | ORAL | Status: DC | PRN

## 2015-09-06 MED ORDER — ASPIRIN 325 MG PO TABS
325.0000 mg | ORAL_TABLET | Freq: Once | ORAL | Status: AC
  Administered 2015-09-06: 325 mg via ORAL
  Filled 2015-09-06: qty 1

## 2015-09-06 MED ORDER — HYDROCODONE-ACETAMINOPHEN 5-325 MG PO TABS: 1.0000 | ORAL_TABLET | ORAL | Status: DC | PRN

## 2015-09-06 MED ORDER — IOHEXOL 300 MG/ML  SOLN
100.0000 mL | Freq: Once | INTRAMUSCULAR | Status: AC | PRN
  Administered 2015-09-06: 100 mL via INTRAVENOUS

## 2015-09-06 MED ORDER — MORPHINE SULFATE (PF) 4 MG/ML IV SOLN
4.0000 mg | Freq: Once | INTRAVENOUS | Status: DC
  Filled 2015-09-06: qty 1

## 2015-09-06 MED ORDER — ALUM & MAG HYDROXIDE-SIMETH 200-200-20 MG/5ML PO SUSP
30.0000 mL | Freq: Once | ORAL | Status: AC
  Administered 2015-09-06: 30 mL via ORAL
  Filled 2015-09-06: qty 30

## 2015-09-06 MED FILL — OXYCODONE/APAP 5/325MG: 5-325 | 2 days supply | Qty: 30 | Fill #0

## 2015-09-06 NOTE — ED Notes (Signed)
Patient states pain has dramatically decreased but still feels pressure in his chest. Strong burning sensation. Inquiring about pain medicine.

## 2015-09-06 NOTE — ED Notes (Signed)
Patient resting at this time.

## 2015-09-06 NOTE — Discharge Instructions (Signed)
Acute Pancreatitis Acute pancreatitis is a disease in which the pancreas becomes suddenly inflamed. The pancreas is a large gland located behind your stomach. The pancreas produces enzymes that help digest food. The pancreas also releases the hormones glucagon and insulin that help regulate blood sugar. Damage to the pancreas occurs when the digestive enzymes from the pancreas are activated and begin attacking the pancreas before being released into the intestine. Most acute attacks last a couple of days and can cause serious complications. Some people become dehydrated and develop low blood pressure. In severe cases, bleeding into the pancreas can lead to shock and can be life-threatening. The lungs, heart, and kidneys may fail. CAUSES  Pancreatitis can happen to anyone. In some cases, the cause is unknown. Most cases are caused by:  Alcohol abuse.  Gallstones. Other less common causes are:  Certain medicines.  Exposure to certain chemicals.  Infection.  Damage caused by an accident (trauma).  Abdominal surgery. SYMPTOMS   Pain in the upper abdomen that may radiate to the back.  Tenderness and swelling of the abdomen.  Nausea and vomiting. DIAGNOSIS  Your caregiver will perform a physical exam. Blood and stool tests may be done to confirm the diagnosis. Imaging tests may also be done, such as X-rays, CT scans, or an ultrasound of the abdomen. TREATMENT  Treatment usually requires a stay in the hospital. Treatment may include:  Pain medicine.  Fluid replacement through an intravenous line (IV).  Placing a tube in the stomach to remove stomach contents and control vomiting.  Not eating for 3 or 4 days. This gives your pancreas a rest, because enzymes are not being produced that can cause further damage.  Antibiotic medicines if your condition is caused by an infection.  Surgery of the pancreas or gallbladder. HOME CARE INSTRUCTIONS   Follow the diet advised by your  caregiver. This may involve avoiding alcohol and decreasing the amount of fat in your diet.  Eat smaller, more frequent meals. This reduces the amount of digestive juices the pancreas produces.  Drink enough fluids to keep your urine clear or pale yellow.  Only take over-the-counter or prescription medicines as directed by your caregiver.  Avoid drinking alcohol if it caused your condition.  Do not smoke.  Get plenty of rest.  Check your blood sugar at home as directed by your caregiver.  Keep all follow-up appointments as directed by your caregiver. SEEK MEDICAL CARE IF:   You do not recover as quickly as expected.  You develop new or worsening symptoms.  You have persistent pain, weakness, or nausea.  You recover and then have another episode of pain. SEEK IMMEDIATE MEDICAL CARE IF:   You are unable to eat or keep fluids down.  Your pain becomes severe.  You have a fever or persistent symptoms for more than 2 to 3 days.  You have a fever and your symptoms suddenly get worse.  Your skin or the white part of your eyes turn yellow (jaundice).  You develop vomiting.  You feel dizzy, or you faint.  Your blood sugar is high (over 300 mg/dL). MAKE SURE YOU:   Understand these instructions.  Will watch your condition.  Will get help right away if you are not doing well or get worse.   This information is not intended to replace advice given to you by your health care provider. Make sure you discuss any questions you have with your health care provider.   Document Released: 07/21/2005 Document Revised: 01/20/2012  Document Reviewed: 10/30/2011 Elsevier Interactive Patient Education 2016 [employer]  Kidney Stones Kidney stones (urolithiasis) are deposits that form inside your kidneys. The intense pain is caused by the stone moving through the urinary tract. When the stone moves, the ureter goes into spasm around the stone. The stone is usually passed in the urine.    CAUSES   A disorder that makes certain neck glands produce too much parathyroid hormone (primary hyperparathyroidism).  A buildup of uric acid crystals, similar to gout in your joints.  Narrowing (stricture) of the ureter.  A kidney obstruction present at birth (congenital obstruction).  Previous surgery on the kidney or ureters.  Numerous kidney infections. SYMPTOMS   Feeling sick to your stomach (nauseous).  Throwing up (vomiting).  Blood in the urine (hematuria).  Pain that usually spreads (radiates) to the groin.  Frequency or urgency of urination. DIAGNOSIS   Taking a history and physical exam.  Blood or urine tests.  CT scan.  Occasionally, an examination of the inside of the urinary bladder (cystoscopy) is performed. TREATMENT   Observation.  Increasing your fluid intake.  Extracorporeal shock wave lithotripsy--This is a noninvasive procedure that uses shock waves to break up kidney stones.  Surgery may be needed if you have severe pain or persistent obstruction. There are various surgical procedures. Most of the procedures are performed with the use of small instruments. Only small incisions are needed to accommodate these instruments, so recovery time is minimized. The size, location, and chemical composition are all important variables that will determine the proper choice of action for you. Talk to your health care provider to better understand your situation so that you will minimize the risk of injury to yourself and your kidney.  HOME CARE INSTRUCTIONS   Drink enough water and fluids to keep your urine clear or pale yellow. This will help you to pass the stone or stone fragments.  Strain all urine through the provided strainer. Keep all particulate matter and stones for your health care provider to see. The stone causing the pain may be as small as a grain of salt. It is very important to use the strainer each and every time you pass your urine. The  collection of your stone will allow your health care provider to analyze it and verify that a stone has actually passed. The stone analysis will often identify what you can do to reduce the incidence of recurrences.  Only take over-the-counter or prescription medicines for pain, discomfort, or fever as directed by your health care provider.  Keep all follow-up visits as told by your health care provider. This is important.  Get follow-up X-rays if required. The absence of pain does not always mean that the stone has passed. It may have only stopped moving. If the urine remains completely obstructed, it can cause loss of kidney function or even complete destruction of the kidney. It is your responsibility to make sure X-rays and follow-ups are completed. Ultrasounds of the kidney can show blockages and the status of the kidney. Ultrasounds are not associated with any radiation and can be performed easily in a matter of minutes.  Make changes to your daily diet as told by your health care provider. You may be told to:  Limit the amount of salt that you eat.  Eat 5 or more servings of fruits and vegetables each day.  Limit the amount of meat, poultry, fish, and eggs that you eat.  Collect a 24-hour urine sample as told by  your health care provider.You may need to collect another urine sample every 6-12 months. SEEK MEDICAL CARE IF:  You experience pain that is progressive and unresponsive to any pain medicine you have been prescribed. SEEK IMMEDIATE MEDICAL CARE IF:   Pain cannot be controlled with the prescribed medicine.  You have a fever or shaking chills.  The severity or intensity of pain increases over 18 hours and is not relieved by pain medicine.  You develop a new onset of abdominal pain.  You feel faint or pass out.  You are unable to urinate.   This information is not intended to replace advice given to you by your health care provider. Make sure you discuss any questions  you have with your health care provider.   Document Released: 07/21/2005 Document Revised: 04/11/2015 Document Reviewed: 12/22/2012 Elsevier Interactive Patient Education [employer]

## 2015-09-06 NOTE — ED Notes (Signed)
Pt states he awoke with knife-like sharp pain to his epigastric area approx 30 minutes ago.  Pt denies radiation of pain

## 2015-09-06 NOTE — ED Provider Notes (Signed)
CSN: [id number]     Arrival date & time 09/06/15  0053 History   First MD Initiated Contact with Patient 09/06/15 0055     Chief Complaint  Patient presents with  . Chest Pain     HPI Patient presents to the emergency department complaining of sharp epigastric discomfort with some radiation up into his chest.  He denies radiation to his arms or shoulders.  No neck or jaw pain.  Denies nausea.  States he was in his normal state of health earlier today.  Denies shortness of breath.  Denies nausea or vomiting.  No back pain.  Denies lower abdominal pain or flank pain.  No urinary complaints.  Denies recent diarrhea.  Denies fever and chills today.  Pain at this time is moderate in severity.  No shortness of breath.  No prior history of heart disease.  He does not have a primary care physician.  He does smoke cigarettes.  He drinks 12-24 ounces of beer a day   Past Medical History  Diagnosis Date  . Gout    Past Surgical History  Procedure Laterality Date  . Tendon repair     No family history on file. Social History  Substance Use Topics  . Smoking status: Current Some Day Smoker    Types: Cigarettes  . Smokeless tobacco: None  . Alcohol Use: Yes    Review of Systems  All other systems reviewed and are negative.     Allergies  Review of patient's allergies indicates no known allergies.  Home Medications   Prior to Admission medications   Medication Sig Start Date End Date Taking? Authorizing Provider  cyclobenzaprine (FLEXERIL) 5 MG tablet Take 1 tablet (5 mg total) by mouth 3 (three) times daily as needed. 09/21/14   Devoria Albe, MD  esomeprazole (NEXIUM) 40 MG capsule Take 1 capsule (40 mg total) by mouth daily. Patient not taking: Reported on 09/20/2014 04/16/14   Gilda Crease, MD  guaiFENesin (MUCINEX) 600 MG 12 hr tablet Take 600 mg by mouth daily as needed for cough.    Historical Provider, MD  naproxen (NAPROSYN) 500 MG tablet Take 1 tablet (500 mg total) by mouth  2 (two) times daily. 09/21/14   Devoria Albe, MD   BP 142/100 mmHg  Pulse 93  Temp(Src) 98.1 F (36.7 C) (Oral)  Resp 18  Ht 6' (1.829 m)  Wt 245 lb (111.131 kg)  BMI 33.22 kg/m2  SpO2 97% Physical Exam  Constitutional: He is oriented to person, place, and time. He appears well-developed and well-nourished.  HENT:  Head: Normocephalic and atraumatic.  Eyes: EOM are normal.  Neck: Normal range of motion.  Cardiovascular: Normal rate, regular rhythm, normal heart sounds and intact distal pulses.   Pulmonary/Chest: Effort normal and breath sounds normal. No respiratory distress.  Abdominal: Soft. He exhibits no distension.  Mild epigastric tenderness without guarding or rebound  Musculoskeletal: Normal range of motion.  Neurological: He is alert and oriented to person, place, and time.  Skin: Skin is warm and dry.  Psychiatric: He has a normal mood and affect. Judgment normal.  Nursing note and vitals reviewed.   ED Course  Procedures (including critical care time) Labs Review Labs Reviewed  COMPREHENSIVE METABOLIC PANEL - Abnormal; Notable for the following:    Glucose, Bld 117 (*)    All other components within normal limits  LIPASE, BLOOD - Abnormal; Notable for the following:    Lipase >400 (*)    All other components within  normal limits  CBC WITH DIFFERENTIAL/PLATELET  TROPONIN I    Imaging Review Dg Chest 2 View  09/06/2015  CLINICAL DATA:  Sharp chest pain EXAM: CHEST  2 VIEW COMPARISON:  09/20/2014 FINDINGS: Normal heart size and mediastinal contours. Mild hypoventilation and interstitial crowding. No acute infiltrate or edema. No effusion or pneumothorax. No acute osseous findings. IMPRESSION: No active cardiopulmonary disease. Electronically Signed   By: Marnee Spring M.D.   On: 09/06/2015 01:35   Ct Abdomen Pelvis W Contrast  09/06/2015  CLINICAL DATA:  Awoke with epigastric upper abdominal pain. Elevated lipase. EXAM: CT ABDOMEN AND PELVIS WITH CONTRAST TECHNIQUE:  Multidetector CT imaging of the abdomen and pelvis was performed using the standard protocol following bolus administration of intravenous contrast. CONTRAST:  OMNIPAQUE IOHEXOL 300 MG/ML  SOLN COMPARISON:  None. FINDINGS: Lower chest: Linear atelectasis in both lower lobes. No pleural effusion. Liver: Decreased density consistent with steatosis. No focal lesion. Hepatobiliary: Gallbladder minimally distended. Equivocal gallbladder wall thickening, or likely secondary to nondistention. Pancreas: No ductal dilatation or peripancreatic inflammatory change. No evidence of pancreatitis. Spleen: Normal. Adrenal glands: No nodule. Kidneys: There are 2 partially obstructing stones in the right mid ureter. More distal stone measures 5 x 5 mm, more proximal stone 9 x 8 mm. Proximal mild hydroureteronephrosis. No significant perinephric stranding. There is delayed excretion on renal delayed phase. Additional punctate nonobstructing stone in the lower right kidney. Scattered right renal cysts. No left hydronephrosis. Tiny cortical cyst in the upper left kidney Stomach/Bowel: Stomach physiologically distended. There are no dilated or thickened small bowel loops. Moderate volume of stool throughout the colon without colonic wall thickening. The appendix is tentatively identified and normal. Vascular/Lymphatic: No retroperitoneal adenopathy. Abdominal aorta is normal in caliber. Moderate atherosclerosis without aneurysm. Reproductive: Prostate the normal for age. Vas deferens calcifications noted. Bladder: Physiologically distended without stone. Other: No free air, free fluid, or intra-abdominal fluid collection. Fat within both inguinal canals. Tiny fat containing umbilical hernia. Musculoskeletal: There are no acute or suspicious osseous abnormalities. Degenerative disc disease at L4-L5 with vacuum phenomenon. IMPRESSION: 1. Partially obstructing stones in the right mid ureter measuring 5 and 9 mm with proximal  hydroureteronephrosis. 2. Normal CT appearance of the pancreas without findings to suggest acute pancreatitis. Electronically Signed   By: Rubye Oaks M.D.   On: 09/06/2015 03:57   I have personally reviewed and evaluated these images and lab results as part of my medical decision-making.  EKG Interpretation #1 Date/Time:  Thursday September 06 2015 01:00:45 EST Ventricular Rate:  97 PR Interval:  132 QRS Duration: 133 QT Interval:  375 QTC Calculation: 476 R Axis:   81 Text Interpretation:  Sinus rhythm Right bundle branch block nonspecific  st changes as compared to prior ecg Confirmed by Cassara Nida  MD, Jahmia Berrett ([id number])  on 09/06/2015 1:16:22 AM      EKG Interpretation #2 Date/Time:  Thursday September 06 2015 01:32:09 EST Ventricular Rate:  92 PR Interval:  135 QRS Duration: 139 QT Interval:  386 QTC Calculation: 477 R Axis:   78 Text Interpretation:  Sinus rhythm Right bundle branch block No significant change since last tracing Confirmed by Amel Gianino  MD, Jaimee Corum ([id number]) on 09/06/2015 4:07:03 AM          MDM   Final diagnoses:  Acute pancreatitis, unspecified pancreatitis type  Ureterolithiasis    Patient with elevated lipase of greater than 400.  CT scan performed which demonstrates no pancreatic head mass.  No obvious CT findings to  suggest pancreatitis from a radiographic standpoint.  Clinically with this epigastric discomfort and pain this appears to be pancreatitis.  Patient has been instructed to be on a liquid diet over the next 24 hours and slowly advance his diet after that.  His pain is significantly improved here in the emergency department.  Incidentally he is noted to have 2 right-sided mid ureteral stones.  He does have a history of kidney stones.  He denies pain consistent with ureteral colic.  Patient will follow-up as an outpatient with urology.  Patient understands to return to the ER for new or worsening symptoms.  Discharge home in good condition.    Azalia Bilis, MD 09/06/15 [phone]

## 2015-09-07 ENCOUNTER — Encounter (HOSPITAL_COMMUNITY): Payer: Self-pay | Admitting: *Deleted

## 2015-09-07 ENCOUNTER — Other Ambulatory Visit: Payer: Self-pay | Admitting: Urology

## 2015-09-07 LAB — COMPREHENSIVE METABOLIC PANEL
ALBUMIN: 3.8 g/dL (ref 3.5–5.0)
ALK PHOS: 89 U/L (ref 38–126)
ALT: 36 U/L (ref 17–63)
ANION GAP: 10 (ref 5–15)
AST: 40 U/L (ref 15–41)
BILIRUBIN TOTAL: 0.6 mg/dL (ref 0.3–1.2)
BUN: 18 mg/dL (ref 6–20)
CALCIUM: 9.1 mg/dL (ref 8.9–10.3)
CO2: 24 mmol/L (ref 22–32)
Chloride: 107 mmol/L (ref 101–111)
Creatinine, Ser: 1.16 mg/dL (ref 0.61–1.24)
GFR calc non Af Amer: >60 mL/min (ref >60)
Glucose, Bld: 117 mg/dL — ABNORMAL HIGH (ref 65–99)
POTASSIUM: 3.8 mmol/L (ref 3.5–5.1)
Sodium: 141 mmol/L (ref 135–145)
TOTAL PROTEIN: 7 g/dL (ref 6.5–8.1)

## 2015-09-07 LAB — LIPASE, BLOOD: LIPASE: 862 U/L — AB (ref 11–51)

## 2015-09-07 NOTE — H&P (Signed)
History of Present Illness Consultation for right ureteral stone referred by Dr. Patria Mane. PCP Dr. Gerda Diss.     1-right ureteral stones-last night patient developed severe epigastric and chest pain. He went to the emergency department where a troponin was negative. He underwent a CT scan of the abdomen and pelvis which showed a proximal 9 mm stone and a about a centimeter down a 5 mm stone. He has had no flank pain. He's been eating and drinking normally today. No fevers or chills. He has a history of kidney stones and underwent shockwave lithotripsy greater than 10 years ago.    He is a Electrical engineer at [employer].    He has another issue today which is premature ejaculation. He gets a good erection and is able to keep it until orgasm but he reaches orgasm too quickly. It is bothersome to the patient and his wife.   Past Medical History Problems  1. History of renal calculi (W09.811)  Current Meds 1. No Reported Medications Recorded  Allergies Medication  1. No Known Drug Allergies  Social History Problems    Alcohol use (Z78.9)   Current every day smoker (F17.200)   Daily caffeine consumption, 2-3 servings a day   Married   Two children  Vitals Vital Signs [Data Includes: Last 1 Day]  Recorded: 02Feb2017 03:10PM  Height: 6 ft  Weight: 240 lb  BMI Calculated: 32.55 BSA Calculated: 2.3 Blood Pressure: 127 / 86 Temperature: 97.7 F Heart Rate: 81  Physical Exam Constitutional: Well nourished and well developed . No acute distress.  Pulmonary: No respiratory distress and normal respiratory rhythm and effort.  Cardiovascular: Heart rate and rhythm are normal . No peripheral edema.  Neuro/Psych:. Mood and affect are appropriate.    Results/Data Urine [Data Includes: Last 1 Day]   02Feb2017  COLOR YELLOW   APPEARANCE CLEAR   SPECIFIC GRAVITY 1.020   pH 6.5   GLUCOSE NEGATIVE   BILIRUBIN NEGATIVE   KETONE NEGATIVE   BLOOD TRACE   PROTEIN NEGATIVE   NITRITE  NEGATIVE   LEUKOCYTE ESTERASE TRACE   SQUAMOUS EPITHELIAL/HPF NONE SEEN HPF  WBC 0-5 WBC/HPF  RBC 3-10 RBC/HPF  BACTERIA NONE SEEN HPF  CRYSTALS NONE SEEN HPF  CASTS NONE SEEN LPF  Yeast NONE SEEN HPF   Procedure KUB-comparison to prior CT, findings:   Assessment Assessed  1. Right ureteral stone (N20.1) 2. 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 UA With REFLEX; [Do Not Release]; Status:Complete;   Done: 02Feb2017 02:44PM Right ureteral stone  2. Start: Oxycodone-Acetaminophen 5-325 MG Oral Tablet; TAKE 1 TO 2 TABLETS EVERY 4  TO 6 HOURS AS NEEDED FOR PAIN  KUB; Status:Resulted - Requires Verification;  Done: 01Jan0001 12:00AM Due:04Feb2017; Marked Important;Ordered; Today;  BJY:NWGNF ureteral stone; Ordered AO:ZHYQMVHQ, Matthew;   Discussion/Summary Right ureteral stones-9 mm and smaller 4-5 mm. They're in close proximity and I think refocus the shocks on the 9 mm it might push the smaller one through and we might be able to spare some shocks on the smaller one also did some motion with the respirations that might move the small bowel along. I say that because he would like to proceed with shockwave lithotripsy again. We discussed the lower success rate with shockwave compared to ureteroscopy. We also discussed surveillance in stone passage is off label use of alpha blockers All questions answered. He elects to proceed with shockwave lithotripsy.    Premature ejaculation-we discussed some treatment such as physical therapy (pelvic  floor contractions or squeeze technique), SSRI's or Promescent. They will give Promescent a try.   After a thorough review of the management options for the patient's condition the patient  elected to proceed with surgical therapy as noted above. We have discussed the potential benefits and risks of the procedure, side effects of the proposed treatment, the likelihood of the patient achieving the goals of the procedure, and any  potential problems that might occur during the procedure or recuperation. Informed consent has been obtained.

## 2015-09-10 ENCOUNTER — Encounter (HOSPITAL_COMMUNITY)
Admission: RE | Disposition: A | Discharge status: Home / Self Care | Payer: Self-pay | Source: Ambulatory Visit | Attending: Urology

## 2015-09-10 ENCOUNTER — Encounter (HOSPITAL_COMMUNITY): Payer: Self-pay | Admitting: *Deleted

## 2015-09-10 ENCOUNTER — Ambulatory Visit: Payer: Self-pay | Admitting: Family Medicine

## 2015-09-10 ENCOUNTER — Ambulatory Visit (HOSPITAL_COMMUNITY)
Admission: RE | Admit: 2015-09-10 | Discharge: 2015-09-10 | Disposition: A | Discharge status: Home / Self Care | Payer: 59 | Source: Ambulatory Visit | Attending: Urology | Admitting: Urology

## 2015-09-10 ENCOUNTER — Ambulatory Visit (HOSPITAL_COMMUNITY): Payer: 59

## 2015-09-10 DIAGNOSIS — F17200 Nicotine dependence, unspecified, uncomplicated: Secondary | ICD-10-CM | POA: Insufficient documentation

## 2015-09-10 DIAGNOSIS — Z87442 Personal history of urinary calculi: Secondary | ICD-10-CM | POA: Insufficient documentation

## 2015-09-10 DIAGNOSIS — N201 Calculus of ureter: Secondary | ICD-10-CM | POA: Diagnosis not present

## 2015-09-10 DIAGNOSIS — F524 Premature ejaculation: Secondary | ICD-10-CM | POA: Diagnosis not present

## 2015-09-10 DIAGNOSIS — Z01818 Encounter for other preprocedural examination: Secondary | ICD-10-CM | POA: Diagnosis not present

## 2015-09-10 HISTORY — DX: Family history of other specified conditions: Z84.89

## 2015-09-10 HISTORY — DX: Personal history of urinary calculi: Z87.442

## 2015-09-10 SURGERY — LITHOTRIPSY, ESWL
Anesthesia: LOCAL | Laterality: Right

## 2015-09-10 MED ORDER — DIAZEPAM 5 MG PO TABS
10.0000 mg | ORAL_TABLET | ORAL | Status: AC
Start: 2015-09-10 — End: 2015-09-10
  Administered 2015-09-10: 10 mg via ORAL
  Filled 2015-09-10: qty 2

## 2015-09-10 MED ORDER — DIPHENHYDRAMINE HCL 25 MG PO CAPS
25.0000 mg | ORAL_CAPSULE | ORAL | Status: AC
  Administered 2015-09-10: 25 mg via ORAL
  Filled 2015-09-10: qty 1

## 2015-09-10 MED ORDER — CIPROFLOXACIN HCL 500 MG PO TABS
500.0000 mg | ORAL_TABLET | ORAL | Status: AC
  Administered 2015-09-10: 500 mg via ORAL
  Filled 2015-09-10: qty 1

## 2015-09-10 MED ORDER — SODIUM CHLORIDE 0.9 % IV SOLN
INTRAVENOUS | Status: DC
  Administered 2015-09-10: 10:00:00 via INTRAVENOUS

## 2015-09-10 MED FILL — PROMETHAZINE 25 MG TABLET: 25 | 3 days supply | Qty: 10 | Fill #0

## 2015-09-10 MED FILL — CIPROFLOXACIN HCL 250 MG TA: 250 | 3 days supply | Qty: 6 | Fill #0

## 2015-09-10 NOTE — Interval H&P Note (Signed)
History and Physical Interval Note:  09/10/2015 7:21 AM  Marcus Hayes  has presented today for surgery, with the diagnosis of RIGHT URETERAL STONE  The various methods of treatment have been discussed with the patient and family. After consideration of risks, benefits and other options for treatment, the patient has consented to  Procedure(s): RIGHT EXTRACORPOREAL SHOCK WAVE LITHOTRIPSY (ESWL) (Right) as a surgical intervention .  The patient's history has been reviewed, patient examined, no change in status, stable for surgery.  I have reviewed the patient's chart and labs.  Questions were answered to the patient's satisfaction.     Imre Vecchione A

## 2015-09-10 NOTE — Discharge Instructions (Signed)
I have reviewed discharge instructions in detail with the patient. They will follow-up with me or their physician as scheduled. My nurse will also be calling the patients as per protocol.   

## 2015-09-10 NOTE — Interval H&P Note (Signed)
History and Physical Interval Note:  09/10/2015 8:38 AM  Marcus Hayes  has presented today for surgery, with the diagnosis of RIGHT URETERAL STONE  The various methods of treatment have been discussed with the patient and family. After consideration of risks, benefits and other options for treatment, the patient has consented to  Procedure(s): RIGHT EXTRACORPOREAL SHOCK WAVE LITHOTRIPSY (ESWL) (Right) as a surgical intervention .  The patient's history has been reviewed, patient examined, no change in status, stable for surgery.  I have reviewed the patient's chart and labs.  Questions were answered to the patient's satisfaction.     Maria Gallicchio A

## 2015-09-10 NOTE — Interval H&P Note (Signed)
History and Physical Interval Note:  09/10/2015 10:13 AM  Marcus Hayes  has presented today for surgery, with the diagnosis of RIGHT URETERAL STONE  The various methods of treatment have been discussed with the patient and family. After consideration of risks, benefits and other options for treatment, the patient has consented to  Procedure(s): RIGHT EXTRACORPOREAL SHOCK WAVE LITHOTRIPSY (ESWL) (Right) as a surgical intervention .  The patient's history has been reviewed, patient examined, no change in status, stable for surgery.  I have reviewed the patient's chart and labs.  Questions were answered to the patient's satisfaction.     Kimberlye Dilger A

## 2015-09-20 ENCOUNTER — Encounter: Payer: Self-pay | Admitting: Family Medicine

## 2015-09-20 ENCOUNTER — Ambulatory Visit (INDEPENDENT_AMBULATORY_CARE_PROVIDER_SITE_OTHER): Payer: 59 | Admitting: Family Medicine

## 2015-09-20 VITALS — BP 116/86 | Ht 72.0 in | Wt 241.4 lb

## 2015-09-20 DIAGNOSIS — Z139 Encounter for screening, unspecified: Secondary | ICD-10-CM | POA: Diagnosis not present

## 2015-09-20 DIAGNOSIS — K8500 Idiopathic acute pancreatitis without necrosis or infection: Secondary | ICD-10-CM

## 2015-09-20 DIAGNOSIS — N200 Calculus of kidney: Secondary | ICD-10-CM

## 2015-09-20 DIAGNOSIS — Z125 Encounter for screening for malignant neoplasm of prostate: Secondary | ICD-10-CM | POA: Diagnosis not present

## 2015-09-20 DIAGNOSIS — Z1211 Encounter for screening for malignant neoplasm of colon: Secondary | ICD-10-CM

## 2015-09-20 DIAGNOSIS — Z13220 Encounter for screening for lipoid disorders: Secondary | ICD-10-CM

## 2015-09-20 DIAGNOSIS — K859 Acute pancreatitis without necrosis or infection, unspecified: Secondary | ICD-10-CM | POA: Insufficient documentation

## 2015-09-20 DIAGNOSIS — M10071 Idiopathic gout, right ankle and foot: Secondary | ICD-10-CM

## 2015-09-20 DIAGNOSIS — Z79899 Other long term (current) drug therapy: Secondary | ICD-10-CM | POA: Diagnosis not present

## 2015-09-20 DIAGNOSIS — Z0000 Encounter for general adult medical examination without abnormal findings: Secondary | ICD-10-CM | POA: Diagnosis not present

## 2015-09-20 DIAGNOSIS — N201 Calculus of ureter: Secondary | ICD-10-CM | POA: Diagnosis not present

## 2015-09-20 DIAGNOSIS — M109 Gout, unspecified: Secondary | ICD-10-CM | POA: Insufficient documentation

## 2015-09-20 DIAGNOSIS — K219 Gastro-esophageal reflux disease without esophagitis: Secondary | ICD-10-CM | POA: Diagnosis not present

## 2015-09-20 MED ORDER — ESOMEPRAZOLE MAGNESIUM 40 MG PO CPDR: 40.0000 mg | DELAYED_RELEASE_CAPSULE | Freq: Every day | ORAL | Status: DC

## 2015-09-20 MED ORDER — COLCHICINE 0.6 MG PO TABS: 0.6000 mg | ORAL_TABLET | Freq: Two times a day (BID) | ORAL | Status: DC

## 2015-09-20 MED ORDER — HYDROCODONE-ACETAMINOPHEN 5-325 MG PO TABS: 1.0000 | ORAL_TABLET | Freq: Four times a day (QID) | ORAL | Status: DC | PRN

## 2015-09-20 MED FILL — COLCHICINE 0.6 MG TABLET: 0.6 | 15 days supply | Qty: 30 | Fill #0

## 2015-09-20 MED FILL — HYDROCODON-APAP 5-325: 5-325 | 8 days supply | Qty: 35 | Fill #0

## 2015-09-20 MED FILL — ESOMEPRAZOLE MAG DR 40 MG C: 40 | 90 days supply | Qty: 90 | Fill #0

## 2015-09-20 MED FILL — TAMSULOSIN HCL 0.4 MG CAP: 0.4 | 30 days supply | Qty: 30 | Fill #0

## 2015-09-20 NOTE — Progress Notes (Signed)
   Subjective:    Patient ID: Marcus Hayes, male    DOB: [birth year], 55 y.o.   MRN: [id number]  HPI Patient in today for an ER follow up for pancreatitis, and gout to great toe on right foot. Patient went [facility] on 09/06/15  Patient relates severe epigastric pain radiated into the chest went to the ER had lab testing CAT scan done showed pancreatitis he denies any chest tightness pressure pain shortness breath with activity denies sweats chills. Also intermittent gout of the right MTP over the past you years worse over the past few weeks pain discomfort with movement.  Also no strong family history of heart disease. Does have intermittent regurgitation and reflux with some intermittent burning Patient also with intermittent low back pain uses hydrocodone occasionally denies a causing drowsiness requests prescription States no other concerns this visit. PMH benign CAT scan showed kidney stones no tumors Review of Systems Please see above    Objective:   Physical Exam Lungs are clear hearts regular abdomen soft no guarding rebound or tenderness extremities no edema blood pressure good       Assessment & Plan:  Gout-check uric acid level use culture seen prescription as directed may need to be on allopurinol dietary measures discussed and given  Acute pancreatitis recheck lipase. No need for follow-up ultrasound. Avoid alcohol. Minimize fried foods.  Reflux Nexium daily basis recommended  Screening labs ordered Screening colonoscopy ordered Kidney stones stable currently follows through with urology Greater than half the time spent in discussion [phone] 08/08/2023 minutes with patient

## 2015-09-21 DIAGNOSIS — Z125 Encounter for screening for malignant neoplasm of prostate: Secondary | ICD-10-CM | POA: Diagnosis not present

## 2015-09-21 DIAGNOSIS — K8500 Idiopathic acute pancreatitis without necrosis or infection: Secondary | ICD-10-CM | POA: Diagnosis not present

## 2015-09-21 DIAGNOSIS — Z139 Encounter for screening, unspecified: Secondary | ICD-10-CM | POA: Diagnosis not present

## 2015-09-21 DIAGNOSIS — M109 Gout, unspecified: Secondary | ICD-10-CM | POA: Diagnosis not present

## 2015-09-21 DIAGNOSIS — Z13220 Encounter for screening for lipoid disorders: Secondary | ICD-10-CM | POA: Diagnosis not present

## 2015-09-21 DIAGNOSIS — Z79899 Other long term (current) drug therapy: Secondary | ICD-10-CM | POA: Diagnosis not present

## 2015-09-22 LAB — HEPATIC FUNCTION PANEL
ALT: 12 IU/L (ref 0–44)
AST: 12 IU/L (ref 0–40)
Albumin: 4 g/dL (ref 3.5–5.5)
Alkaline Phosphatase: 97 IU/L (ref 39–117)
BILIRUBIN, DIRECT: 0.06 mg/dL (ref 0.00–0.40)
Total Protein: 6.7 g/dL (ref 6.0–8.5)

## 2015-09-22 LAB — LIPASE: Lipase: 30 U/L (ref 0–59)

## 2015-09-22 LAB — PSA: Prostate Specific Ag, Serum: 0.7 ng/mL (ref 0.0–4.0)

## 2015-09-22 LAB — LIPID PANEL
CHOLESTEROL TOTAL: 236 mg/dL — AB (ref 100–199)
Chol/HDL Ratio: 4.5 ratio units (ref 0.0–5.0)
HDL: 53 mg/dL (ref >39)
LDL CALC: 154 mg/dL — AB (ref 0–99)
TRIGLYCERIDES: 147 mg/dL (ref 0–149)
VLDL Cholesterol Cal: 29 mg/dL (ref 5–40)

## 2015-09-24 ENCOUNTER — Other Ambulatory Visit: Payer: Self-pay | Admitting: *Deleted

## 2015-09-24 DIAGNOSIS — M109 Gout, unspecified: Secondary | ICD-10-CM

## 2015-09-24 DIAGNOSIS — E785 Hyperlipidemia, unspecified: Secondary | ICD-10-CM

## 2015-09-24 DIAGNOSIS — Z79899 Other long term (current) drug therapy: Secondary | ICD-10-CM

## 2015-09-24 LAB — SPECIMEN STATUS REPORT

## 2015-09-24 MED ORDER — ATORVASTATIN CALCIUM 10 MG PO TABS: 10.0000 mg | ORAL_TABLET | Freq: Every day | ORAL | Status: DC

## 2015-09-24 MED FILL — ATORVASTATIN 10 MG TABLET: 10 | 90 days supply | Qty: 90 | Fill #0

## 2015-09-25 LAB — SPECIMEN STATUS REPORT

## 2015-09-25 LAB — URIC ACID: Uric Acid: 8.4 mg/dL (ref 3.7–8.6)

## 2015-09-26 ENCOUNTER — Encounter: Payer: Self-pay | Admitting: Family Medicine

## 2015-09-27 NOTE — Patient Instructions (Signed)
Low-Purine Diet  Purines are compounds that affect the level of uric acid in your body. A low-purine diet is a diet that is low in purines. Eating a low-purine diet can prevent the level of uric acid in your body from getting too high and causing gout or kidney stones or both.  WHAT DO I NEED TO KNOW ABOUT THIS DIET?  · Choose low-purine foods. Examples of low-purine foods are listed in the next section.  · Drink plenty of fluids, especially water. Fluids can help remove uric acid from your body. Try to drink 8-16 cups (1.9-3.8 L) a day.  · Limit foods high in fat, especially saturated fat, as fat makes it harder for the body to get rid of uric acid. Foods high in saturated fat include pizza, cheese, ice cream, whole milk, fried foods, and gravies. Choose foods that are lower in fat and lean sources of protein. Use olive oil when cooking as it contains healthy fats that are not high in saturated fat.  · Limit alcohol. Alcohol interferes with the elimination of uric acid from your body. If you are having a gout attack, avoid all alcohol.  · Keep in mind that different people's bodies react differently to different foods. You will probably learn over time which foods do or do not affect you. If you discover that a food tends to cause your gout to flare up, avoid eating that food. You can more freely enjoy foods that do not cause problems. If you have any questions about a food item, talk to your dietitian or health care provider.  WHICH FOODS ARE LOW, MODERATE, AND HIGH IN PURINES?  The following is a list of foods that are low, moderate, and high in purines. You can eat any amount of the foods that are low in purines. You may be able to have small amounts of foods that are moderate in purines. Ask your health care provider how much of a food moderate in purines you can have. Avoid foods high in purines.  Grains  · Foods low in purines: Enriched white bread, pasta, rice, cake, cornbread, popcorn.  · Foods moderate in  purines: Whole-grain breads and cereals, wheat germ, bran, oatmeal. Uncooked oatmeal. Dry wheat bran or wheat germ.  · Foods high in purines: Pancakes, French toast, biscuits, muffins.  Vegetables  · Foods low in purines: All vegetables, except those that are moderate in purines.  · Foods moderate in purines: Asparagus, cauliflower, spinach, mushrooms, green peas.  Fruits  · All fruits are low in purines.  Meats and other Protein Foods  · Foods low in purines: Eggs, nuts, peanut butter.  · Foods moderate in purines: 80-90% lean beef, lamb, veal, pork, poultry, fish, eggs, peanut butter, nuts. Crab, lobster, oysters, and shrimp. Cooked dried beans, peas, and lentils.  · Foods high in purines: Anchovies, sardines, herring, mussels, tuna, codfish, scallops, trout, and haddock. Bacon. Organ meats (such as liver or kidney). Tripe. Game meat. Goose. Sweetbreads.  Dairy  · All dairy foods are low in purines. Low-fat and fat-free dairy products are best because they are low in saturated fat.  Beverages  · Drinks low in purines: Water, carbonated beverages, tea, coffee, cocoa.  · Drinks moderate in purines: Soft drinks and other drinks sweetened with high-fructose corn syrup. Juices. To find whether a food or drink is sweetened with high-fructose corn syrup, look at the ingredients list.  · Drinks high in purines: Alcoholic beverages (such as beer).  Condiments  · Foods   low in purines: Salt, herbs, olives, pickles, relishes, vinegar.  · Foods moderate in purines: Butter, margarine, oils, mayonnaise.  Fats and Oils  · Foods low in purines: All types, except gravies and sauces made with meat.  · Foods high in purines: Gravies and sauces made with meat.  Other Foods  · Foods low in purines: Sugars, sweets, gelatin. Cake. Soups made without meat.  · Foods moderate in purines: Meat-based or fish-based soups, broths, or bouillons. Foods and drinks sweetened with high-fructose corn syrup.  · Foods high in purines: High-fat desserts  (such as ice cream, cookies, cakes, pies, doughnuts, and chocolate).  Contact your dietitian for more information on foods that are not listed here.     This information is not intended to replace advice given to you by your health care provider. Make sure you discuss any questions you have with your health care provider.     Document Released: 11/15/2010 Document Revised: 07/26/2013 Document Reviewed: 06/27/2013  Elsevier Interactive Patient Education ©2016 [employer] Inc[employer]

## 2015-10-10 ENCOUNTER — Other Ambulatory Visit: Payer: Self-pay | Admitting: Urology

## 2015-10-10 DIAGNOSIS — Z0000 Encounter for general adult medical examination without abnormal findings: Secondary | ICD-10-CM | POA: Diagnosis not present

## 2015-10-10 DIAGNOSIS — N201 Calculus of ureter: Secondary | ICD-10-CM | POA: Diagnosis not present

## 2015-10-16 ENCOUNTER — Encounter (HOSPITAL_BASED_OUTPATIENT_CLINIC_OR_DEPARTMENT_OTHER): Payer: Self-pay | Admitting: *Deleted

## 2015-10-16 MED FILL — HYDROCODON-APAP 5-325: 5-325 | 2 days supply | Qty: 20 | Fill #0

## 2015-10-16 NOTE — Progress Notes (Signed)
Pt instructed npo p mn 3/20x nexium, vicodin w sip of water. Pt aware no smoking as well p mn.  [facility].  Needs hgb on arrival.

## 2015-10-22 NOTE — H&P (Signed)
History of Present Illness F/u - PCP Dr. Gerda DissLuking    1-right ureteral stones - last night patient developed severe epigastric and chest pain. He went to the emergency department where a troponin was negative. He underwent a CT scan of the abdomen and pelvis which showed a proximal 9 mm stone and a about a centimeter down a 5 mm stone. He has had no flank pain. He's been eating and drinking normally today. No fevers or chills. He has a history of kidney stones and underwent shockwave lithotripsy greater than 10 years ago.   2- premature ejaculation. He gets a good erection and is able to keep it until orgasm but he reaches orgasm too quickly. It is bothersome to the patient and his wife.    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    Mar 2017 int hx  The patient returns following right shockwave lithotripsy two ureteral stones. The distal smaller stone passed and the larger more proximal stone remaining and has not changed a lot. Fortunately the patient has done well with no dysuria, flank pain or fever.    Past Medical History Problems  1. History of renal calculi (W09.811(Z87.442)  Surgical History Problems  1. History of Renal Lithotripsy  Current Meds 1. Colchicine 0.6 MG Oral Tablet;  Therapy: (Recorded:16Feb2017) to Recorded 2. Hydrocodone-Acetaminophen 5-325 MG Oral Tablet; Take 1-2 tablets every 4-6 hours for  pain;  Therapy: 16Feb2017 to (Last Rx:16Feb2017) Ordered 3. NexIUM CPDR;  Therapy: (Recorded:16Feb2017) to Recorded 4. Potassium Citrate ER 15 MEQ (1620 MG) Oral Tablet Extended Release; Take 1 tablet  daily;  Therapy: 16Feb2017 to (Last Rx:16Feb2017) Ordered 5. Tamsulosin HCl - 0.4 MG Oral Capsule; TAKE 1 CAPSULE Daily;  Therapy: 16Feb2017 to (Last Rx:16Feb2017)  Requested for: 16Feb2017 Ordered  Allergies Medication  1. No Known Drug Allergies  Social History Problems  1. Alcohol use (Z78.9) 2. Current every day smoker (F17.200) 3. Daily caffeine consumption, 2-3  servings a day 4. Married 5. Two children  Vitals Vital Signs [Data Includes: Last 1 Day]  Recorded: 08Mar2017 08:09AM  Blood Pressure: 130 / 89 Temperature: 98.5 F Heart Rate: 86  Physical Exam Constitutional: Well nourished and well developed . No acute distress.  Neuro/Psych:. Mood and affect are appropriate.    Results/Data Urine [Data Includes: Last 1 Day]   08Mar2017  COLOR YELLOW   APPEARANCE CLEAR   SPECIFIC GRAVITY 1.025   pH 6.0   GLUCOSE NEGATIVE   BILIRUBIN NEGATIVE   KETONE NEGATIVE   BLOOD NEGATIVE   PROTEIN NEGATIVE   NITRITE NEGATIVE   LEUKOCYTE ESTERASE NEGATIVE    Assessment Assessed  1. Right ureteral stone (N20.1)  Plan Health Maintenance  1. UA With REFLEX; [Do Not Release]; Status:Complete;   Done: 08Mar2017 07:35AM Right ureteral stone  2. Follow-up Schedule Surgery Office  Follow-up  Status: Hold For - Appointment   Requested for: 08Mar2017  Discussion/Summary      Right ureteral stones-9 mm stone remains and has not changed a lot. Given the hydronephrosis on the prior imaging I would recommend we set him up for ureteroscopy in the next few weeks. We discussed the nature risks benefits and alternatives to right ureteroscopy/HLL/stent including the risk of a staged procedure/pre-stenting. I drew him a picture of the anatomy and went over all the details.     Signatures Electronically signed by : Jerilee FieldMatthew Tymeshia Awan, M.D.; Oct 10 2015  8:34AM EST

## 2015-10-23 ENCOUNTER — Ambulatory Visit (HOSPITAL_BASED_OUTPATIENT_CLINIC_OR_DEPARTMENT_OTHER): Payer: 59 | Admitting: Anesthesiology

## 2015-10-23 ENCOUNTER — Ambulatory Visit (HOSPITAL_BASED_OUTPATIENT_CLINIC_OR_DEPARTMENT_OTHER)
Admission: RE | Admit: 2015-10-23 | Discharge: 2015-10-23 | Disposition: A | Discharge status: Home / Self Care | Payer: 59 | Source: Ambulatory Visit | Attending: Urology | Admitting: Urology

## 2015-10-23 ENCOUNTER — Encounter (HOSPITAL_BASED_OUTPATIENT_CLINIC_OR_DEPARTMENT_OTHER): Payer: Self-pay | Admitting: *Deleted

## 2015-10-23 ENCOUNTER — Encounter (HOSPITAL_BASED_OUTPATIENT_CLINIC_OR_DEPARTMENT_OTHER)
Admission: RE | Disposition: A | Discharge status: Home / Self Care | Payer: Self-pay | Source: Ambulatory Visit | Attending: Urology

## 2015-10-23 DIAGNOSIS — Z87442 Personal history of urinary calculi: Secondary | ICD-10-CM | POA: Insufficient documentation

## 2015-10-23 DIAGNOSIS — F17200 Nicotine dependence, unspecified, uncomplicated: Secondary | ICD-10-CM | POA: Insufficient documentation

## 2015-10-23 DIAGNOSIS — Z79891 Long term (current) use of opiate analgesic: Secondary | ICD-10-CM | POA: Diagnosis not present

## 2015-10-23 DIAGNOSIS — M1990 Unspecified osteoarthritis, unspecified site: Secondary | ICD-10-CM | POA: Diagnosis not present

## 2015-10-23 DIAGNOSIS — Z79899 Other long term (current) drug therapy: Secondary | ICD-10-CM | POA: Diagnosis not present

## 2015-10-23 DIAGNOSIS — N201 Calculus of ureter: Secondary | ICD-10-CM | POA: Diagnosis not present

## 2015-10-23 DIAGNOSIS — K219 Gastro-esophageal reflux disease without esophagitis: Secondary | ICD-10-CM | POA: Diagnosis not present

## 2015-10-23 HISTORY — DX: Gastro-esophageal reflux disease without esophagitis: K21.9

## 2015-10-23 HISTORY — PX: CYSTOSCOPY/URETEROSCOPY/HOLMIUM LASER/STENT PLACEMENT: SHX6546

## 2015-10-23 HISTORY — DX: Unspecified osteoarthritis, unspecified site: M19.90

## 2015-10-23 LAB — POCT HEMOGLOBIN-HEMACUE: HEMOGLOBIN: 14.3 g/dL (ref 13.0–17.0)

## 2015-10-23 SURGERY — CYSTOSCOPY/URETEROSCOPY/HOLMIUM LASER/STENT PLACEMENT
Anesthesia: General | Site: Bladder | Laterality: Right

## 2015-10-23 MED ORDER — BELLADONNA ALKALOIDS-OPIUM 16.2-60 MG RE SUPP
RECTAL | Status: AC
  Filled 2015-10-23: qty 1

## 2015-10-23 MED ORDER — DEXAMETHASONE SODIUM PHOSPHATE 10 MG/ML IJ SOLN
INTRAMUSCULAR | Status: AC
  Filled 2015-10-23: qty 1

## 2015-10-23 MED ORDER — MIDAZOLAM HCL 2 MG/2ML IJ SOLN
INTRAMUSCULAR | Status: AC
  Filled 2015-10-23: qty 2

## 2015-10-23 MED ORDER — CEFAZOLIN SODIUM-DEXTROSE 2-3 GM-% IV SOLR
INTRAVENOUS | Status: AC
  Filled 2015-10-23: qty 50

## 2015-10-23 MED ORDER — FENTANYL CITRATE (PF) 100 MCG/2ML IJ SOLN
INTRAMUSCULAR | Status: DC | PRN
  Administered 2015-10-23 (×2): 25 ug via INTRAVENOUS
  Administered 2015-10-23: 50 ug via INTRAVENOUS
  Administered 2015-10-23: 25 ug via INTRAVENOUS

## 2015-10-23 MED ORDER — ONDANSETRON HCL 4 MG/2ML IJ SOLN
4.0000 mg | Freq: Once | INTRAMUSCULAR | Status: DC | PRN
  Filled 2015-10-23: qty 2

## 2015-10-23 MED ORDER — PHENAZOPYRIDINE HCL 200 MG PO TABS: 200.0000 mg | ORAL_TABLET | Freq: Three times a day (TID) | ORAL | Status: DC | PRN

## 2015-10-23 MED ORDER — KETOROLAC TROMETHAMINE 30 MG/ML IJ SOLN
INTRAMUSCULAR | Status: AC
  Filled 2015-10-23: qty 1

## 2015-10-23 MED ORDER — TRAMADOL HCL 50 MG PO TABS
50.0000 mg | ORAL_TABLET | Freq: Four times a day (QID) | ORAL | Status: DC | PRN
  Administered 2015-10-23: 50 mg via ORAL
  Filled 2015-10-23: qty 1

## 2015-10-23 MED ORDER — SODIUM CHLORIDE 0.9 % IR SOLN
Status: DC | PRN
  Administered 2015-10-23: 1000 mL via INTRAVESICAL
  Administered 2015-10-23: 3000 mL via INTRAVESICAL

## 2015-10-23 MED ORDER — LACTATED RINGERS IV SOLN
INTRAVENOUS | Status: DC
  Administered 2015-10-23 (×2): via INTRAVENOUS
  Filled 2015-10-23: qty 1000

## 2015-10-23 MED ORDER — PROPOFOL 10 MG/ML IV BOLUS
INTRAVENOUS | Status: AC
  Filled 2015-10-23: qty 20

## 2015-10-23 MED ORDER — TRAMADOL HCL 50 MG PO TABS
ORAL_TABLET | ORAL | Status: AC
  Filled 2015-10-23: qty 1

## 2015-10-23 MED ORDER — LIDOCAINE HCL (CARDIAC) 20 MG/ML IV SOLN
INTRAVENOUS | Status: AC
  Filled 2015-10-23: qty 5

## 2015-10-23 MED ORDER — PHENAZOPYRIDINE HCL 200 MG PO TABS
200.0000 mg | ORAL_TABLET | Freq: Three times a day (TID) | ORAL | Status: DC
  Administered 2015-10-23: 200 mg via ORAL
  Filled 2015-10-23: qty 1

## 2015-10-23 MED ORDER — FENTANYL CITRATE (PF) 100 MCG/2ML IJ SOLN
INTRAMUSCULAR | Status: AC
  Filled 2015-10-23: qty 2

## 2015-10-23 MED ORDER — ONDANSETRON HCL 4 MG/2ML IJ SOLN
INTRAMUSCULAR | Status: DC | PRN
  Administered 2015-10-23: 4 mg via INTRAVENOUS

## 2015-10-23 MED ORDER — DEXAMETHASONE SODIUM PHOSPHATE 4 MG/ML IJ SOLN
INTRAMUSCULAR | Status: DC | PRN
  Administered 2015-10-23: 10 mg via INTRAVENOUS

## 2015-10-23 MED ORDER — IOHEXOL 350 MG/ML SOLN
INTRAVENOUS | Status: DC | PRN
  Administered 2015-10-23: 7 mL

## 2015-10-23 MED ORDER — CEFAZOLIN SODIUM-DEXTROSE 2-3 GM-% IV SOLR
2.0000 g | INTRAVENOUS | Status: AC
  Administered 2015-10-23: 2 g via INTRAVENOUS
  Filled 2015-10-23: qty 50

## 2015-10-23 MED ORDER — ONDANSETRON HCL 4 MG/2ML IJ SOLN
INTRAMUSCULAR | Status: AC
  Filled 2015-10-23: qty 2

## 2015-10-23 MED ORDER — PHENYLEPHRINE HCL 10 MG/ML IJ SOLN
INTRAMUSCULAR | Status: DC | PRN
  Administered 2015-10-23: 80 ug via INTRAVENOUS
  Administered 2015-10-23: 100 ug via INTRAVENOUS
  Administered 2015-10-23: 80 ug via INTRAVENOUS
  Administered 2015-10-23: 60 ug via INTRAVENOUS

## 2015-10-23 MED ORDER — EPHEDRINE SULFATE 50 MG/ML IJ SOLN
INTRAMUSCULAR | Status: DC | PRN
  Administered 2015-10-23: 15 mg via INTRAVENOUS

## 2015-10-23 MED ORDER — MIDAZOLAM HCL 5 MG/5ML IJ SOLN
INTRAMUSCULAR | Status: DC | PRN
  Administered 2015-10-23: 2 mg via INTRAVENOUS

## 2015-10-23 MED ORDER — NITROFURANTOIN MONOHYD MACRO 100 MG PO CAPS: 100.0000 mg | ORAL_CAPSULE | Freq: Every day | ORAL | Status: DC

## 2015-10-23 MED ORDER — PROPOFOL 10 MG/ML IV BOLUS
INTRAVENOUS | Status: DC | PRN
  Administered 2015-10-23: 300 mg via INTRAVENOUS

## 2015-10-23 MED ORDER — KETOROLAC TROMETHAMINE 30 MG/ML IJ SOLN
INTRAMUSCULAR | Status: DC | PRN
  Administered 2015-10-23: 30 mg via INTRAVENOUS

## 2015-10-23 MED ORDER — PHENAZOPYRIDINE HCL 100 MG PO TABS
ORAL_TABLET | ORAL | Status: AC
  Filled 2015-10-23: qty 2

## 2015-10-23 MED ORDER — LIDOCAINE HCL (CARDIAC) 20 MG/ML IV SOLN
INTRAVENOUS | Status: DC | PRN
  Administered 2015-10-23: 100 mg via INTRAVENOUS

## 2015-10-23 MED ORDER — FENTANYL CITRATE (PF) 100 MCG/2ML IJ SOLN
25.0000 ug | INTRAMUSCULAR | Status: DC | PRN
  Filled 2015-10-23: qty 1

## 2015-10-23 MED ORDER — CEFAZOLIN SODIUM 1-5 GM-% IV SOLN
1.0000 g | INTRAVENOUS | Status: DC
  Filled 2015-10-23: qty 50

## 2015-10-23 MED ORDER — TRAMADOL HCL 50 MG PO TABS: 50.0000 mg | ORAL_TABLET | Freq: Four times a day (QID) | ORAL | Status: DC | PRN

## 2015-10-23 MED FILL — PHENAZOPYRIDINE 200 MG TAB: 200 | 4 days supply | Qty: 12 | Fill #0

## 2015-10-23 MED FILL — NITROFURANTOIN MONO-MCR 100: 100 | 7 days supply | Qty: 7 | Fill #0

## 2015-10-23 SURGICAL SUPPLY — 27 items
BAG DRAIN URO-CYSTO SKYTR STRL (DRAIN) ×3 IMPLANT
BAG DRN UROCATH (DRAIN) ×1
BASKET DAKOTA 1.9FR 11X120 (BASKET) ×2 IMPLANT
BASKET LASER NITINOL 1.9FR (BASKET) ×2 IMPLANT
BSKT STON RTRVL 120 1.9FR (BASKET) ×1
CATH INTERMIT  6FR 70CM (CATHETERS) ×2 IMPLANT
CATH URET 5FR 28IN CONE TIP (BALLOONS) ×2
CATH URET 5FR 70CM CONE TIP (BALLOONS) IMPLANT
CLOTH BEACON ORANGE TIMEOUT ST (SAFETY) ×3 IMPLANT
FIBER LASER TRAC TIP (UROLOGICAL SUPPLIES) ×2 IMPLANT
GLOVE BIO SURGEON STRL SZ 6.5 (GLOVE) ×3 IMPLANT
GLOVE BIO SURGEON STRL SZ7.5 (GLOVE) ×5 IMPLANT
GLOVE BIO SURGEONS STRL SZ 6.5 (GLOVE) ×3
GLOVE BIOGEL PI IND STRL 6.5 (GLOVE) IMPLANT
GLOVE BIOGEL PI INDICATOR 6.5 (GLOVE) ×6
GOWN STRL REUS W/ TWL LRG LVL3 (GOWN DISPOSABLE) ×1 IMPLANT
GOWN STRL REUS W/ TWL XL LVL3 (GOWN DISPOSABLE) ×1 IMPLANT
GOWN STRL REUS W/TWL LRG LVL3 (GOWN DISPOSABLE) ×9
GOWN STRL REUS W/TWL XL LVL3 (GOWN DISPOSABLE) ×3
GUIDEWIRE ANG ZIPWIRE 038X150 (WIRE) ×2 IMPLANT
GUIDEWIRE STR DUAL SENSOR (WIRE) ×3 IMPLANT
IV NS IRRIG 3000ML ARTHROMATIC (IV SOLUTION) ×6 IMPLANT
KIT ROOM TURNOVER WOR (KITS) ×3 IMPLANT
MANIFOLD NEPTUNE II (INSTRUMENTS) ×2 IMPLANT
PACK CYSTO (CUSTOM PROCEDURE TRAY) ×3 IMPLANT
SHEATH ACCESS URETERAL 38CM (SHEATH) ×2 IMPLANT
STENT URET 6FRX28 CONTOUR (STENTS) ×2 IMPLANT

## 2015-10-23 NOTE — Anesthesia Postprocedure Evaluation (Signed)
Anesthesia Post Note  Patient: Marcus Hayes  Procedure(s) Performed: Procedure(s) (LRB): CYSTOSCOPY/RETROGRADE/ URETEROSCOPY/HOLMIUM LASER/STONE BASKETRY/STENT PLACEMENT (Right)  Patient location during evaluation: PACU Anesthesia Type: General Level of consciousness: awake and alert Pain management: pain level controlled Vital Signs Assessment: post-procedure vital signs reviewed and stable Respiratory status: spontaneous breathing, nonlabored ventilation, respiratory function stable and patient connected to nasal cannula oxygen Cardiovascular status: blood pressure returned to baseline and stable Postop Assessment: no signs of nausea or vomiting Anesthetic complications: no    Last Vitals:  Filed Vitals:   10/23/15 1255 10/23/15 1403  BP:  132/82  Pulse: 73 66  Temp:  36.4 C  Resp: 17 16    Last Pain:  Filed Vitals:   10/23/15 1409  PainSc: 7                  Marcus Hayes DAVID

## 2015-10-23 NOTE — Op Note (Signed)
Preoperative diagnosis: Right ureteral stone Postoperative diagnosis: Right ureteral stone  Procedure: Cystoscopy, right retrograde pyelogram, right ureteroscopy with holmium laser lithotripsy, stone basket extraction and stent placement  Surgeon: Mena GoesEskridge  Anesthesia: Gen.  Indication for procedure: 55 year old with 2 ureteral stones on the right underwent shockwave lithotripsy. The smaller more distal stone passed but the larger more proximal stone remained. He was brought today for definitive treatment of the proximal stone.  Findings:  On exam under anesthesia the penis was circumcised without mass or lesion. The testicles were descended bilaterally and palpably normal. On digital rectal exam the prostate was normal size and smooth without hard areas or nodules.  On cystoscopy the bladder was unremarkable. There were no stones or foreign bodies in the bladder.  On ureteroscopy the stone was located in the proximal ureter where it was impacted in the ureteral wall was some erythema and edema bullous edema.  Right retrograde pyelogram-this outlined a single ureter single collecting system unit with a filling defect in the proximal ureter consistent with the stone. Mild dilation proximal to this. The stone was visible on the scout image.  Description of procedure: After consent was obtained patient brought to the operating room. After adequate anesthesia he was placed in lithotomy position and prepped and draped in the usual sterile fashion. The cystoscope was passed per urethra and the bladder inspected. A sensor wire was advanced up the right ureter and cold in the collecting system. The needlepoint single-channel ureteroscope was advanced and the stone was visualized. It was fragmented 0.2 and 50. The majority of the stone dusted but 2 of the larger fragments went more proximal in the ureter and the scope was too short to follow these. I then put up the dual channel semirigid but it was too  large to get up over the iliac vessels. Therefore I went back to the single-channel needlepoint long ureteroscope but because of its flexibility I couldn't quite get through the prior areas of edema and therefore thought it was best to switch over to the digital scope. I passed a Glidewire under direct vision and then backed out the semirigid. I passed a ureteral access sheath without difficulty and then the single-channel digital ureteroscope. I was able to locate a few fragments in the proximal ureter which were removed and then the larger piece in the upper pole collecting system which was fragmented 0.8 and 8 and the remaining significant pieces removed. There were some smaller less than 1 mm fragments that were insignificant. The collecting system was carefully inspected and no other stones or significant stone fragments were noted. The ureter was inspected down to the access sheath and apart from the stone impaction site appeared normal. The sheath in the scope or backed out together in the ureter appeared normal. The wire was backloaded on the cystoscope and a 6 x 28 cm stent was advanced but I noted on the fluoroscopy and area in the kidney that was likely residual contrast but could have been a stone fragment. Therefore I thought it was best to remove the stent and placed 2 wires, the sensor in the glide back through the ureter. Over the sensor advanced the digital scope again into the right collecting system and the area was noted to be contrast and washed away. Collecting system was visualized again and no other significant stone fragments were noted. The ureter was inspected on the way out and noted to be free of injury and stone fragment. The wire was backloaded on the  cystoscope and a 6 x 28 cm stent was advanced again the wire was removed with a blue coil seen in the kidney and a good coil in the bladder. The bladder was drained and the scope removed I left a string on the stent. Patient was awakened  taken to recovery room in stable condition.   Complications: None   Blood loss: 1 mL   Drains: 6 x 28 cm right ureteral stent with string   Specimens: Stones to office lab   Disposition : Patient stable to PACU

## 2015-10-23 NOTE — Anesthesia Preprocedure Evaluation (Signed)
Anesthesia Evaluation  Patient identified by MRN, date of birth, ID band Patient awake    Reviewed: Allergy & Precautions, NPO status , Patient's Chart, lab work & pertinent test results  History of Anesthesia Complications Negative for: history of anesthetic complications  Airway Mallampati: II  TM Distance: >3 FB Neck ROM: Full    Dental no notable dental hx. (+) Dental Advisory Given   Pulmonary Current Smoker,    Pulmonary exam normal breath sounds clear to auscultation       Cardiovascular negative cardio ROS Normal cardiovascular exam Rhythm:Regular Rate:Normal     Neuro/Psych negative neurological ROS  negative psych ROS   GI/Hepatic Neg liver ROS, GERD  Medicated and Controlled,  Endo/Other  negative endocrine ROSobesity  Renal/GU negative Renal ROS  negative genitourinary   Musculoskeletal  (+) Arthritis ,   Abdominal   Peds negative pediatric ROS (+)  Hematology negative hematology ROS (+)   Anesthesia Other Findings   Reproductive/Obstetrics negative OB ROS                             Anesthesia Physical Anesthesia Plan  ASA: II  Anesthesia Plan: General   Post-op Pain Management:    Induction: Intravenous  Airway Management Planned: LMA  Additional Equipment:   Intra-op Plan:   Post-operative Plan: Extubation in OR  Informed Consent: I have reviewed the patients History and Physical, chart, labs and discussed the procedure including the risks, benefits and alternatives for the proposed anesthesia with the patient or authorized representative who has indicated his/her understanding and acceptance.   Dental advisory given  Plan Discussed with: CRNA  Anesthesia Plan Comments:         Anesthesia Quick Evaluation

## 2015-10-23 NOTE — Anesthesia Procedure Notes (Signed)
Procedure Name: LMA Insertion Date/Time: 10/23/2015 10:50 AM Performed by: Maris BergerENENNY, Riel Hirschman T Pre-anesthesia Checklist: Patient identified, Emergency Drugs available, Suction available and Patient being monitored Patient Re-evaluated:Patient Re-evaluated prior to inductionOxygen Delivery Method: Circle System Utilized Preoxygenation: Pre-oxygenation with 100% oxygen Intubation Type: IV induction Ventilation: Mask ventilation without difficulty LMA: LMA inserted LMA Size: 4.0 Number of attempts: 1 Airway Equipment and Method: Bite block Placement Confirmation: positive ETCO2 Dental Injury: Teeth and Oropharynx as per pre-operative assessment

## 2015-10-23 NOTE — Transfer of Care (Signed)
Immediate Anesthesia Transfer of Care Note  Patient: Marcus Hayes [name]  Procedure(s) Performed: Procedure(s): CYSTOSCOPY/RETROGRADE/ URETEROSCOPY/HOLMIUM LASER/STONE BASKETRY/STENT PLACEMENT (Right)  Patient Location: PACU  Anesthesia Type:General  Level of Consciousness: awake, alert  and oriented  Airway & Oxygen Therapy: Patient Spontanous Breathing and Patient connected to nasal cannula oxygen  Post-op Assessment: Report given to RN  Post vital signs: Reviewed and stable  Last Vitals: 131/836, 81, 22, 95% Filed Vitals:   10/23/15 0930  BP: 137/79  Pulse: 76  Temp: 36.7 C  Resp: 16    Complications: No apparent anesthesia complications

## 2015-10-23 NOTE — Discharge Instructions (Signed)
Ureteral Stent Implantation, Care After Refer to this sheet in the next few weeks. These instructions provide you with information on caring for yourself after your procedure. Your health care provider may also give you more specific instructions. Your treatment has been planned according to current medical practices, but problems sometimes occur. Call your health care provider if you have any problems or questions after your procedure.  REMOVAL OF THE STENT: Remove the stent by pulling the string with as instructed with slow steady pressure, Monday morning, 10/29/2014.  WHAT TO EXPECT AFTER THE PROCEDURE You should be back to normal activity within 48 hours after the procedure. Nausea and vomiting may occur and are commonly the result of anesthesia. It is common to experience sharp pain in the back or lower abdomen and penis with voiding. This is caused by movement of the ends of the stent with the act of urinating.It usually goes away within minutes after you have stopped urinating. HOME CARE INSTRUCTIONS Make sure to drink plenty of fluids. You may have small amounts of bleeding, causing your urine to be red. This is normal. Certain movements may trigger pain or a feeling that you need to urinate. You may be given medicines to prevent infection or bladder spasms. Be sure to take all medicines as directed. Only take over-the-counter or prescription medicines for pain, discomfort, or fever as directed by your health care provider. Do not take aspirin, as this can make bleeding worse. Your stent will be left in until the blockage is resolved. This may take 2 weeks or longer, depending on the reason for stent implantation. You may have an X-ray exam to make sure your ureter is open and that the stent has not moved out of position (migrated). The stent can be removed by your health care provider in the office. Medicines may be given for comfort while the stent is being removed. Be sure to keep all follow-up  appointments so your health care provider can check that you are healing properly. SEEK MEDICAL CARE IF: 1. You experience increasing pain. 2. Your pain medicine is not working. SEEK IMMEDIATE MEDICAL CARE IF:  Your urine is dark red or has blood clots.  You are leaking urine (incontinent).  You have a fever, chills, feeling sick to your stomach (nausea), or vomiting.  Your pain is not relieved by pain medicine.  The end of the stent comes out of the urethra.  You are unable to urinate.   This information is not intended to replace advice given to you by your health care provider. Make sure you discuss any questions you have with your health care provider.   Document Released: 03/23/2013 Document Revised: 07/26/2013 Document Reviewed: 02/02/2015 Elsevier Interactive Patient Education 2016 [employer]     Post Anesthesia Home Care Instructions  Activity: Get plenty of rest for the remainder of the day. A responsible adult should stay with you for 24 hours following the procedure.  For the next 24 hours, DO NOT: -Drive a car -Advertising copywriterperate machinery -Drink alcoholic beverages -Take any medication unless instructed by your physician -Make any legal decisions or sign important papers.  Meals: Start with liquid foods such as gelatin or soup. Progress to regular foods as tolerated. Avoid greasy, spicy, heavy foods. If nausea and/or vomiting occur, drink only clear liquids until the nausea and/or vomiting subsides. Call your physician if vomiting continues.  Special Instructions/Symptoms: Your throat may feel dry or sore from the anesthesia or the breathing tube placed in your throat  during surgery. If this causes discomfort, gargle with warm salt water. The discomfort should disappear within 24 hours.  If you had a scopolamine patch placed behind your ear for the management of post- operative nausea and/or vomiting:  1. The medication in the patch is effective for 72 hours, after  which it should be removed.  Wrap patch in a tissue and discard in the trash. Wash hands thoroughly with soap and water. 2. You may remove the patch earlier than 72 hours if you experience unpleasant side effects which may include dry mouth, dizziness or visual disturbances. 3. Avoid touching the patch. Wash your hands with soap and water after contact with the patch.   [facility] [phone] Post Ureteroscopy With or Without Stent Instructions  Definitions:  Ureter: The duct that transports urine from the kidney to the bladder. Stent:   A plastic hollow tube that is placed into the ureter, from the kidney to the                 bladder to prevent the ureter from swelling shut.  GENERAL INSTRUCTIONS:  Despite the fact that no skin incisions were used, the area around the ureter and bladder is raw and irritated. The stent is a foreign body which will further irritate the bladder wall. This irritation is manifested by increased frequency of urination, both day and night, and by an increase in the urge to urinate. In some, the urge to urinate is present almost always. Sometimes the urge is strong enough that you may not be able to stop yourself from urinating. The only real cure is to remove the stent and then give time for the bladder wall to heal which can't be done until the danger of the ureter swelling shut has passed, which varies.  You may see some blood in your urine while the stent is in place and a few days afterwards. Do not be alarmed, even if the urine was clear for a while. Get off your feet and drink lots of fluids until clearing occurs. If you start to pass clots or don't improve, call us.  DIET: You may return to your normal diet immediately. Because of the raw surface of your bladder, alcohol, spicy foods, acid type foods and drinks with caffeine may cause irritation or frequency and should be used in moderation. To keep your urine flowing freely and to avoid  constipation, drink plenty of fluids during the day ( 8-10 glasses ). Tip: Avoid cranberry juice because it is very acidic.  ACTIVITY: Your physical activity doesn't need to be restricted. However, if you are very active, you may see some blood in your urine. We suggest that you reduce your activity under these circumstances until the bleeding has stopped.  BOWELS: It is important to keep your bowels regular during the postoperative period. Straining with bowel movements can cause bleeding. A bowel movement every other day is reasonable. Use a mild laxative if needed, such as Milk of Magnesia 2-3 tablespoons, or 2 Dulcolax tablets. Call if you continue to have problems. If you have been taking narcotics for pain, before, during or after your surgery, you may be constipated. Take a laxative if necessary.   MEDICATION: You should resume your pre-surgery medications unless told not to. In addition you will often be given an antibiotic to prevent infection. These should be taken as prescribed until the bottles are finished unless you are having an unusual reaction to one of the drugs.  PROBLEMS YOU  SHOULD REPORT TO [place]:  Fevers over 100.5 Fahrenheit.  Heavy bleeding, or clots ( See above notes about blood in urine ).  Inability to urinate.  Drug reactions ( hives, rash, nausea, vomiting, diarrhea ).  Severe burning or pain with urination that is not improving.  FOLLOW-UP: You will need a follow-up appointment to monitor your progress. Call for this appointment at the number listed above. Usually the first appointment will be about three to fourteen days after your surgery.

## 2015-10-23 NOTE — Interval H&P Note (Signed)
History and Physical Interval Note:  10/23/2015 10:35 AM  Marcus Hayes  has presented today for surgery, with the diagnosis of RIGHT URETERAL STONE  The various methods of treatment have been discussed with the patient and family. After consideration of nature, risks, benefits and other options for treatment, the patient has consented to  Procedure(s): CYSTOSCOPY/URETEROSCOPY/HOLMIUM LASER/STENT PLACEMENT (Right) as a surgical intervention .  Discussed again with patient and wife possible need for staged procedure/pre-stent. He has been well. No flank pain, stone passage, dysuria or gross hematuria. The patient's history has been reviewed, patient examined, no change in status, stable for surgery.  I have reviewed the patient's chart and labs.  Questions were answered to the patient's satisfaction.  He elects to proceed.    Royelle Hinchman

## 2015-10-24 ENCOUNTER — Encounter: Payer: Self-pay | Admitting: Family Medicine

## 2015-10-24 MED FILL — traMADol HCL 50 MG TABS: 50 | 4 days supply | Qty: 18 | Fill #0

## 2015-10-25 ENCOUNTER — Encounter (HOSPITAL_BASED_OUTPATIENT_CLINIC_OR_DEPARTMENT_OTHER): Payer: Self-pay | Admitting: Urology

## 2015-10-29 ENCOUNTER — Encounter (HOSPITAL_COMMUNITY): Payer: Self-pay | Admitting: *Deleted

## 2015-10-29 ENCOUNTER — Emergency Department (HOSPITAL_COMMUNITY)
Admission: EM | Admit: 2015-10-29 | Discharge: 2015-10-29 | Disposition: A | Discharge status: Home / Self Care | Payer: 59 | Attending: Emergency Medicine | Admitting: Emergency Medicine

## 2015-10-29 DIAGNOSIS — F17210 Nicotine dependence, cigarettes, uncomplicated: Secondary | ICD-10-CM | POA: Insufficient documentation

## 2015-10-29 DIAGNOSIS — N368 Other specified disorders of urethra: Secondary | ICD-10-CM | POA: Diagnosis not present

## 2015-10-29 DIAGNOSIS — N23 Unspecified renal colic: Secondary | ICD-10-CM | POA: Diagnosis not present

## 2015-10-29 DIAGNOSIS — Z79899 Other long term (current) drug therapy: Secondary | ICD-10-CM | POA: Diagnosis not present

## 2015-10-29 DIAGNOSIS — R1030 Lower abdominal pain, unspecified: Secondary | ICD-10-CM | POA: Diagnosis present

## 2015-10-29 LAB — URINALYSIS, ROUTINE W REFLEX MICROSCOPIC
Bilirubin Urine: NEGATIVE
GLUCOSE, UA: 100 mg/dL — AB
Ketones, ur: NEGATIVE mg/dL
LEUKOCYTES UA: NEGATIVE
Nitrite: POSITIVE — AB
PH: 5 (ref 5.0–8.0)
Protein, ur: NEGATIVE mg/dL
SPECIFIC GRAVITY, URINE: 1 — AB (ref 1.005–1.030)

## 2015-10-29 LAB — CBC WITH DIFFERENTIAL/PLATELET
BASOS ABS: 0 10*3/uL (ref 0.0–0.1)
BASOS PCT: 0 %
EOS PCT: 0 %
Eosinophils Absolute: 0 10*3/uL (ref 0.0–0.7)
HEMATOCRIT: 40.9 % (ref 39.0–52.0)
Hemoglobin: 13.9 g/dL (ref 13.0–17.0)
LYMPHS PCT: 11 %
Lymphs Abs: 1.2 10*3/uL (ref 0.7–4.0)
MCH: 30.5 pg (ref 26.0–34.0)
MCHC: 34 g/dL (ref 30.0–36.0)
MCV: 89.7 fL (ref 78.0–100.0)
MONO ABS: 0.5 10*3/uL (ref 0.1–1.0)
Monocytes Relative: 5 %
NEUTROS ABS: 9.6 10*3/uL — AB (ref 1.7–7.7)
Neutrophils Relative %: 84 %
PLATELETS: 216 10*3/uL (ref 150–400)
RBC: 4.56 MIL/uL (ref 4.22–5.81)
RDW: 13.1 % (ref 11.5–15.5)
WBC: 11.3 10*3/uL — AB (ref 4.0–10.5)

## 2015-10-29 LAB — COMPREHENSIVE METABOLIC PANEL
ALBUMIN: 3.6 g/dL (ref 3.5–5.0)
ALT: 34 U/L (ref 17–63)
AST: 36 U/L (ref 15–41)
Alkaline Phosphatase: 80 U/L (ref 38–126)
Anion gap: 10 (ref 5–15)
BUN: 20 mg/dL (ref 6–20)
CHLORIDE: 106 mmol/L (ref 101–111)
CO2: 20 mmol/L — ABNORMAL LOW (ref 22–32)
CREATININE: 1.24 mg/dL (ref 0.61–1.24)
Calcium: 8.5 mg/dL — ABNORMAL LOW (ref 8.9–10.3)
GFR calc Af Amer: >60 mL/min (ref >60)
GFR calc non Af Amer: >60 mL/min (ref >60)
GLUCOSE: 228 mg/dL — AB (ref 65–99)
POTASSIUM: 3.9 mmol/L (ref 3.5–5.1)
Sodium: 136 mmol/L (ref 135–145)
Total Bilirubin: 0.4 mg/dL (ref 0.3–1.2)
Total Protein: 6.5 g/dL (ref 6.5–8.1)

## 2015-10-29 LAB — URINE MICROSCOPIC-ADD ON

## 2015-10-29 LAB — LIPASE, BLOOD: Lipase: 23 U/L (ref 11–51)

## 2015-10-29 MED ORDER — PHENAZOPYRIDINE HCL 100 MG PO TABS
100.0000 mg | ORAL_TABLET | Freq: Once | ORAL | Status: AC
  Administered 2015-10-29: 100 mg via ORAL
  Filled 2015-10-29: qty 1

## 2015-10-29 MED ORDER — MORPHINE SULFATE (PF) 4 MG/ML IV SOLN
4.0000 mg | Freq: Once | INTRAVENOUS | Status: AC
  Administered 2015-10-29: 4 mg via INTRAVENOUS
  Filled 2015-10-29: qty 1

## 2015-10-29 MED ORDER — KETOROLAC TROMETHAMINE 30 MG/ML IJ SOLN
30.0000 mg | Freq: Once | INTRAMUSCULAR | Status: AC
  Administered 2015-10-29: 30 mg via INTRAVENOUS
  Filled 2015-10-29: qty 1

## 2015-10-29 MED ORDER — MORPHINE SULFATE (PF) 4 MG/ML IV SOLN
INTRAVENOUS | Status: AC
  Filled 2015-10-29: qty 1

## 2015-10-29 NOTE — ED Notes (Signed)
Pt c/o abd pain that started this evening after he pulled out his stent for kidney stones that was placed on 10/23/2015. Marland Kitchen. Denies any n/v

## 2015-10-29 NOTE — Discharge Instructions (Signed)
°  You were seen today for pain after removing your ureteral stent. Your lab work is reassuring. you likely have spasming of your ureter and bladder. Continue pain medication at home. Follow-up with urology. If you develop fever, worsening pain, or any new or worsening symptoms you should be reevaluated.
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
## 2015-10-31 LAB — URINE CULTURE: CULTURE: NO GROWTH

## 2015-12-18 DIAGNOSIS — Z0000 Encounter for general adult medical examination without abnormal findings: Secondary | ICD-10-CM | POA: Diagnosis not present

## 2015-12-18 DIAGNOSIS — N201 Calculus of ureter: Secondary | ICD-10-CM | POA: Diagnosis not present

## 2016-09-25 DIAGNOSIS — H524 Presbyopia: Secondary | ICD-10-CM | POA: Diagnosis not present

## 2016-09-25 DIAGNOSIS — H5213 Myopia, bilateral: Secondary | ICD-10-CM | POA: Diagnosis not present

## 2016-09-25 DIAGNOSIS — H52223 Regular astigmatism, bilateral: Secondary | ICD-10-CM | POA: Diagnosis not present

## 2016-10-09 ENCOUNTER — Ambulatory Visit (INDEPENDENT_AMBULATORY_CARE_PROVIDER_SITE_OTHER): Payer: 59 | Admitting: Family Medicine

## 2016-10-09 ENCOUNTER — Encounter: Payer: Self-pay | Admitting: Family Medicine

## 2016-10-09 VITALS — BP 132/90 | Ht 72.0 in | Wt 254.0 lb

## 2016-10-09 DIAGNOSIS — M545 Low back pain: Secondary | ICD-10-CM

## 2016-10-09 DIAGNOSIS — M10071 Idiopathic gout, right ankle and foot: Secondary | ICD-10-CM | POA: Diagnosis not present

## 2016-10-09 DIAGNOSIS — Z79899 Other long term (current) drug therapy: Secondary | ICD-10-CM | POA: Diagnosis not present

## 2016-10-09 DIAGNOSIS — Z125 Encounter for screening for malignant neoplasm of prostate: Secondary | ICD-10-CM

## 2016-10-09 DIAGNOSIS — R739 Hyperglycemia, unspecified: Secondary | ICD-10-CM

## 2016-10-09 DIAGNOSIS — K219 Gastro-esophageal reflux disease without esophagitis: Secondary | ICD-10-CM | POA: Diagnosis not present

## 2016-10-09 DIAGNOSIS — G8929 Other chronic pain: Secondary | ICD-10-CM

## 2016-10-09 DIAGNOSIS — M549 Dorsalgia, unspecified: Secondary | ICD-10-CM

## 2016-10-09 DIAGNOSIS — E785 Hyperlipidemia, unspecified: Secondary | ICD-10-CM | POA: Diagnosis not present

## 2016-10-09 MED ORDER — COLCHICINE 0.6 MG PO TABS: 0.6000 mg | ORAL_TABLET | Freq: Two times a day (BID) | ORAL | 3 refills | Status: DC

## 2016-10-09 MED ORDER — HYDROCODONE-ACETAMINOPHEN 5-325 MG PO TABS: 1.0000 | ORAL_TABLET | Freq: Four times a day (QID) | ORAL | 0 refills | Status: DC | PRN

## 2016-10-09 MED ORDER — ESOMEPRAZOLE MAGNESIUM 40 MG PO CPDR: 40.0000 mg | DELAYED_RELEASE_CAPSULE | Freq: Every day | ORAL | 3 refills | Status: DC

## 2016-10-09 MED FILL — COLCHICINE 0.6 MG TABLET: 0.6 | 20 days supply | Qty: 40 | Fill #0

## 2016-10-09 MED FILL — HYDROCODON-APAP 5-325: 5-325 | 8 days supply | Qty: 35 | Fill #0

## 2016-10-09 MED FILL — ESOMEPRAZOLE MAG DR 40 MG C: 40 | 90 days supply | Qty: 90 | Fill #0

## 2016-10-09 NOTE — Patient Instructions (Addendum)
Dear Patient,  It has been recommended to you that you have a colonoscopy. It is your responsibility to carry through with this recommendation.   Did you realize that colon cancer is the second leading cancer killer in the [place]. One in every 20 adults will get colon cancer. If all adults would go through the recommended screening for colon cancer (getting a colonoscopy), then there would be a 60% reduction in the number of people dying from colon cancer.  Colon cancer just doesn't come out of the blue. It starts off as a small polyp which over time grows into a cancer. A colonoscopy can prevent cancer and in many cases detected when it is at a very treatable phase. Small colon cancers can have cure rates of 95%. Advanced colon cancer, which often occurs in people who do not do their screenings, have cure rates less than 20%. The risk of colon cancer advances with age. Most adults should have regular colonoscopies every 10 years starting at age 76. This recommendation can vary depending on a person's medical history.  Health-care laws now allow for you to call the gastroenterologist office directly in order to set yourself up for this very important tests. Today we have recommended to you that you do this test. This test may save your life. Failure to do this test puts you at risk for premature death from colon cancer. Do the right thing and schedule this test now.  Here as a list of specialists we recommend in the surrounding area. When you call their office let them know that you are a patient of our practice in your interested in doing a screening colonoscopy. They should assist you without problems. You will need the following information when you called them: 1-name of which Dr. you see, 2-your insurance information, 3-a list of medications that you currently take, 4-any allergies you have to medications.  [facility] gastroenterologist Dr. Augusto Gamble, Dr Dione Housekeeper  gastroenterologist   [phone]  Dr[facility]   [phone]  [facility] gastroenterology (Dr. Sherle Poe and [place]) [phone]  [facility] gastroenterology (Dr. Philippa Sicks, [place], [place]) [phone]  Each group of specialists has assured [place] that when you called them they will help you get your colonoscopy set up. Should you have problems or if the GI practice insist a referral be done please let us know. Be sure to call soon. Sincerely, Sherie Don, Dr Lubertha South, Dr.Scott Luking       Back Exercises The following exercises strengthen the muscles that help to support the back. They also help to keep the lower back flexible. Doing these exercises can help to prevent back pain or lessen existing pain. If you have back pain or discomfort, try doing these exercises 2-3 times each day or as told by your health care provider. When the pain goes away, do them once each day, but increase the number of times that you repeat the steps for each exercise (do more repetitions). If you do not have back pain or discomfort, do these exercises once each day or as told by your health care provider. Exercises Single Knee to Chest   Repeat these steps 3-5 times for each leg: 1. Lie on your back on a firm bed or the floor with your legs extended. 2. Bring one knee to your chest. Your other leg should stay extended and in contact with the floor. 3. Hold your knee in  place by grabbing your knee or thigh. 4. Pull on your knee until you feel a gentle stretch in your lower back. 5. Hold the stretch for 10-30 seconds. 6. Slowly release and straighten your leg. Pelvic Tilt   Repeat these steps 5-10 times: 1. Lie on your back on a firm bed or the floor with your legs extended. 2. Bend your knees so they are pointing toward the ceiling and your feet are flat on the floor. 3. Tighten your lower  abdominal muscles to press your lower back against the floor. This motion will tilt your pelvis so your tailbone points up toward the ceiling instead of pointing to your feet or the floor. 4. With gentle tension and even breathing, hold this position for 5-10 seconds. Cat-Cow   Repeat these steps until your lower back becomes more flexible: 1. Get into a hands-and-knees position on a firm surface. Keep your hands under your shoulders, and keep your knees under your hips. You may place padding under your knees for comfort. 2. Let your head hang down, and point your tailbone toward the floor so your lower back becomes rounded like the back of a cat. 3. Hold this position for 5 seconds. 4. Slowly lift your head and point your tailbone up toward the ceiling so your back forms a sagging arch like the back of a cow. 5. Hold this position for 5 seconds. Press-Ups   Repeat these steps 5-10 times: 1. Lie on your abdomen (face-down) on the floor. 2. Place your palms near your head, about shoulder-width apart. 3. While you keep your back as relaxed as possible and keep your hips on the floor, slowly straighten your arms to raise the top half of your body and lift your shoulders. Do not use your back muscles to raise your upper torso. You may adjust the placement of your hands to make yourself more comfortable. 4. Hold this position for 5 seconds while you keep your back relaxed. 5. Slowly return to lying flat on the floor. Bridges   Repeat these steps 10 times: 1. Lie on your back on a firm surface. 2. Bend your knees so they are pointing toward the ceiling and your feet are flat on the floor. 3. Tighten your buttocks muscles and lift your buttocks off of the floor until your waist is at almost the same height as your knees. You should feel the muscles working in your buttocks and the back of your thighs. If you do not feel these muscles, slide your feet 1-2 inches farther away from your  buttocks. 4. Hold this position for 3-5 seconds. 5. Slowly lower your hips to the starting position, and allow your buttocks muscles to relax completely. If this exercise is too easy, try doing it with your arms crossed over your chest. Abdominal Crunches   Repeat these steps 5-10 times: 1. Lie on your back on a firm bed or the floor with your legs extended. 2. Bend your knees so they are pointing toward the ceiling and your feet are flat on the floor. 3. Cross your arms over your chest. 4. Tip your chin slightly toward your chest without bending your neck. 5. Tighten your abdominal muscles and slowly raise your trunk (torso) high enough to lift your shoulder blades a tiny bit off of the floor. Avoid raising your torso higher than that, because it can put too much stress on your low back and it does not help to strengthen your abdominal muscles. 6. Slowly return  to your starting position. Back Lifts  Repeat these steps 5-10 times: 1. Lie on your abdomen (face-down) with your arms at your sides, and rest your forehead on the floor. 2. Tighten the muscles in your legs and your buttocks. 3. Slowly lift your chest off of the floor while you keep your hips pressed to the floor. Keep the back of your head in line with the curve in your back. Your eyes should be looking at the floor. 4. Hold this position for 3-5 seconds. 5. Slowly return to your starting position. Contact a health care provider if:  Your back pain or discomfort gets much worse when you do an exercise.  Your back pain or discomfort does not lessen within 2 hours after you exercise. If you have any of these problems, stop doing these exercises right away. Do not do them again unless your health care provider says that you can. Get help right away if:  You develop sudden, severe back pain. If this happens, stop doing the exercises right away. Do not do them again unless your health care provider says that you can. This  information is not intended to replace advice given to you by your health care provider. Make sure you discuss any questions you have with your health care provider. Document Released: 08/28/2004 Document Revised: 11/28/2015 Document Reviewed: 09/14/2014 Elsevier Interactive Patient Education  2017 [employer] Gout Gout is painful swelling that can occur in some of your joints. Gout is a type of arthritis. This condition is caused by having too much uric acid in your body. Uric acid is a chemical that forms when your body breaks down substances called purines. Purines are important for building body proteins. When your body has too much uric acid, sharp crystals can form and build up inside your joints. This causes pain and swelling. Gout attacks can happen quickly and be very painful (acute gout). Over time, the attacks can affect more joints and become more frequent (chronic gout). Gout can also cause uric acid to build up under your skin and inside your kidneys. What are the causes? This condition is caused by too much uric acid in your blood. This can occur because:  Your kidneys do not remove enough uric acid from your blood. This is the most common cause.  Your body makes too much uric acid. This can occur with some cancers and cancer treatments. It can also occur if your body is breaking down too many red blood cells (hemolytic anemia).  You eat too many foods that are high in purines. These foods include organ meats and some seafood. Alcohol, especially beer, is also high in purines. A gout attack may be triggered by trauma or stress. What increases the risk? This condition is more likely to develop in people who:  Have a family history of gout.  Are male and middle-aged.  Are male and have gone through menopause.  Are obese.  Frequently drink alcohol, especially beer.  Are dehydrated.  Lose weight too quickly.  Have an organ transplant.  Have lead poisoning.  Take  certain medicines, including aspirin, cyclosporine, diuretics, levodopa, and niacin.  Have kidney disease or psoriasis. What are the signs or symptoms? An attack of acute gout happens quickly. It usually occurs in just one joint. The most common place is the big toe. Attacks often start at night. Other joints that may be affected include joints of the feet, ankle, knee, fingers, wrist, or elbow. Symptoms may include:  Severe pain.  Warmth.  Swelling.  Stiffness.  Tenderness. The affected joint may be very painful to touch.  Shiny, red, or purple skin.  Chills and fever. Chronic gout may cause symptoms more frequently. More joints may be involved. You may also have white or yellow lumps (tophi) on your hands or feet or in other areas near your joints. How is this diagnosed? This condition is diagnosed based on your symptoms, medical history, and physical exam. You may have tests, such as:  Blood tests to measure uric acid levels.  Removal of joint fluid with a needle (aspiration) to look for uric acid crystals.  X-rays to look for joint damage. How is this treated? Treatment for this condition has two phases: treating an acute attack and preventing future attacks. Acute gout treatment may include medicines to reduce pain and swelling, including:  NSAIDs.  Steroids. These are strong anti-inflammatory medicines that can be taken by mouth (orally) or injected into a joint.  Colchicine. This medicine relieves pain and swelling when it is taken soon after an attack. It can be given orally or through an IV tube. Preventive treatment may include:  Daily use of smaller doses of NSAIDs or colchicine.  Use of a medicine that reduces uric acid levels in your blood.  Changes to your diet. You may need to see a specialist about healthy eating (dietitian). Follow these instructions at home: During a Gout Attack   If directed, apply ice to the affected area:  Put ice in a plastic  bag.  Place a towel between your skin and the bag.  Leave the ice on for 20 minutes, 2-3 times a day.  Rest the joint as much as possible. If the affected joint is in your leg, you may be given crutches to use.  Raise (elevate) the affected joint above the level of your heart as often as possible.  Drink enough fluids to keep your urine clear or pale yellow.  Take over-the-counter and prescription medicines only as told by your health care provider.  Do not drive or operate heavy machinery while taking prescription pain medicine.  Follow instructions from your health care provider about eating or drinking restrictions.  Return to your normal activities as told by your health care provider. Ask your health care provider what activities are safe for you. Avoiding Future Gout Attacks   Follow a low-purine diet as told by your dietitian or health care provider. Avoid foods and drinks that are high in purines, including liver, kidney, anchovies, asparagus, herring, mushrooms, mussels, and beer.  Limit alcohol intake to no more than 1 drink a day for nonpregnant women and 2 drinks a day for men. One drink equals 12 oz of beer, 5 oz of wine, or 1 oz of hard liquor.  Maintain a healthy weight or lose weight if you are overweight. If you want to lose weight, talk with your health care provider. It is important that you do not lose weight too quickly.  Start or maintain an exercise program as told by your health care provider.  Drink enough fluids to keep your urine clear or pale yellow.  Take over-the-counter and prescription medicines only as told by your health care provider.  Keep all follow-up visits as told by your health care provider. This is important. Contact a health care provider if:  You have another gout attack.  You continue to have symptoms of a gout attack after10 days of treatment.  You have side effects from your medicines.  You  have chills or a fever.  You have  burning pain when you urinate.  You have pain in your lower back or belly. Get help right away if:  You have severe or uncontrolled pain.  You cannot urinate. This information is not intended to replace advice given to you by your health care provider. Make sure you discuss any questions you have with your health care provider. Document Released: 07/18/2000 Document Revised: 12/27/2015 Document Reviewed: 05/03/2015 Elsevier Interactive Patient Education  2017 [employer] Inc. Low-Purine Diet Purines are compounds that affect the level of uric acid in your body. A low-purine diet is a diet that is low in purines. Eating a low-purine diet can prevent the level of uric acid in your body from getting too high and causing gout or kidney stones or both. What do I need to know about this diet?  Choose low-purine foods. Examples of low-purine foods are listed in the next section.  Drink plenty of fluids, especially water. Fluids can help remove uric acid from your body. Try to drink 8-16 cups (1.9-3.8 L) a day.  Limit foods high in fat, especially saturated fat, as fat makes it harder for the body to get rid of uric acid. Foods high in saturated fat include pizza, cheese, ice cream, whole milk, fried foods, and gravies. Choose foods that are lower in fat and lean sources of protein. Use olive oil when cooking as it contains healthy fats that are not high in saturated fat.  Limit alcohol. Alcohol interferes with the elimination of uric acid from your body. If you are having a gout attack, avoid all alcohol.  Keep in mind that different people's bodies react differently to different foods. You will probably learn over time which foods do or do not affect you. If you discover that a food tends to cause your gout to flare up, avoid eating that food. You can more freely enjoy foods that do not cause problems. If you have any questions about a food item, talk to your dietitian or health care provider. Which  foods are low, moderate, and high in purines? The following is a list of foods that are low, moderate, and high in purines. You can eat any amount of the foods that are low in purines. You may be able to have small amounts of foods that are moderate in purines. Ask your health care provider how much of a food moderate in purines you can have. Avoid foods high in purines. Grains   Foods low in purines: Enriched white bread, pasta, rice, cake, cornbread, popcorn.  Foods moderate in purines: Whole-grain breads and cereals, wheat germ, bran, oatmeal. Uncooked oatmeal. Dry wheat bran or wheat germ.  Foods high in purines: Pancakes, [place] toast, biscuits, muffins. Vegetables   Foods low in purines: All vegetables, except those that are moderate in purines.  Foods moderate in purines: Asparagus, cauliflower, spinach, mushrooms, green peas. Fruits   All fruits are low in purines. Meats and other Protein Foods   Foods low in purines: Eggs, nuts, peanut butter.  Foods moderate in purines: 80-90% lean beef, lamb, veal, pork, poultry, fish, eggs, peanut butter, nuts. Crab, lobster, oysters, and shrimp. Cooked dried beans, peas, and lentils.  Foods high in purines: Anchovies, sardines, herring, mussels, tuna, codfish, scallops, trout, and haddock. Tomasa Blase. Organ meats (such as liver or kidney). Tripe. Game meat. Goose. Sweetbreads. Dairy   All dairy foods are low in purines. Low-fat and fat-free dairy products are best because they are low in  saturated fat. Beverages   Drinks low in purines: Water, carbonated beverages, tea, coffee, cocoa.  Drinks moderate in purines: Soft drinks and other drinks sweetened with high-fructose corn syrup. Juices. To find whether a food or drink is sweetened with high-fructose corn syrup, look at the ingredients list.  Drinks high in purines: Alcoholic beverages (such as beer). Condiments   Foods low in purines: Salt, herbs, olives, pickles, relishes,  vinegar.  Foods moderate in purines: Butter, margarine, oils, mayonnaise. Fats and Oils   Foods low in purines: All types, except gravies and sauces made with meat.  Foods high in purines: Gravies and sauces made with meat. Other Foods   Foods low in purines: Sugars, sweets, gelatin. Cake. Soups made without meat.  Foods moderate in purines: Meat-based or fish-based soups, broths, or bouillons. Foods and drinks sweetened with high-fructose corn syrup.  Foods high in purines: High-fat desserts (such as ice cream, cookies, cakes, pies, doughnuts, and chocolate). Contact your dietitian for more information on foods that are not listed here.  This information is not intended to replace advice given to you by your health care provider. Make sure you discuss any questions you have with your health care provider. Document Released: 11/15/2010 Document Revised: 12/27/2015 Document Reviewed: 06/27/2013 Elsevier Interactive Patient Education  2017 [employer].

## 2016-10-09 NOTE — Progress Notes (Signed)
   Subjective:    Patient ID: Marcus Hayes, male    DOB: [birth year], 56 y.o.   MRN: [id number]  Back Pain  This is a chronic problem. The current episode started more than 1 year ago. The pain is present in the lumbar spine. Treatments tried: hydrocodone.   Gout in both great toes. Wants refills on colchicine. Patient states when he takes a medicine does help he relates he gets 1 or 2 attacks per month he does admit to alcohol use and admits to sometimes eating a fair amount he denies any sweats chills vomiting  We talked at length today about preventative health in keeping his overall health good including quitting smoking healthy eating regular activity wellness checkup and colonoscopy patient really is not interested in quitting smoking and is not interested with following up on a wellness exam at this point and definitely not interested in a colonoscopy   The back pain stays in the low mid back it's more so when he does too much he states he only uses pain medicine rarely most of the time just uses anti-inflammatory he is aware to use pain medicine only when at home not with driving Review of Systems  Musculoskeletal: Positive for back pain.       Objective:   Physical Exam       25 minutes was spent with the patient. Greater than half the time was spent in discussion and answering questions and counseling regarding the issues that the patient came in for today.  Assessment & Plan:  Gout-low purine diet recommended, cold to seen twice a day may be used, check uric acid level may need to be on allopurinol  Counseled patient to avoid alcohol and to quit smoking  Counseled patient to consider colonoscopy  Patient defers currently on colonoscopy  History hyperglycemia recheck lab work check A1c  Reflux issues using OTC measures because he ran out of Nexium Nexium refill if ongoing trouble notify us  Hyperlipidemia previous labs reviewed current labs ordered await the  results may well need to be on medication  Chronic low back pain without sciatica uses anti-inflammatories but when that doesn't work he likes to use hydrocodone additional prescription were given-caution drowsiness

## 2016-12-09 DIAGNOSIS — M4316 Spondylolisthesis, lumbar region: Secondary | ICD-10-CM | POA: Diagnosis not present

## 2016-12-10 ENCOUNTER — Other Ambulatory Visit (HOSPITAL_COMMUNITY): Payer: Self-pay | Admitting: Neurological Surgery

## 2016-12-10 DIAGNOSIS — M4316 Spondylolisthesis, lumbar region: Secondary | ICD-10-CM

## 2016-12-24 ENCOUNTER — Ambulatory Visit (HOSPITAL_COMMUNITY)
Admission: RE | Admit: 2016-12-24 | Discharge: 2016-12-24 | Disposition: A | Discharge status: Home / Self Care | Payer: 59 | Source: Ambulatory Visit | Attending: Neurological Surgery | Admitting: Neurological Surgery

## 2016-12-24 DIAGNOSIS — M8938 Hypertrophy of bone, other site: Secondary | ICD-10-CM | POA: Insufficient documentation

## 2016-12-24 DIAGNOSIS — M48061 Spinal stenosis, lumbar region without neurogenic claudication: Secondary | ICD-10-CM | POA: Diagnosis not present

## 2016-12-24 DIAGNOSIS — M5126 Other intervertebral disc displacement, lumbar region: Secondary | ICD-10-CM | POA: Diagnosis not present

## 2016-12-24 DIAGNOSIS — M4316 Spondylolisthesis, lumbar region: Secondary | ICD-10-CM | POA: Diagnosis not present

## 2016-12-24 DIAGNOSIS — M545 Low back pain: Secondary | ICD-10-CM | POA: Diagnosis not present

## 2017-01-05 DIAGNOSIS — M4726 Other spondylosis with radiculopathy, lumbar region: Secondary | ICD-10-CM | POA: Diagnosis not present

## 2017-01-05 DIAGNOSIS — M5136 Other intervertebral disc degeneration, lumbar region: Secondary | ICD-10-CM | POA: Diagnosis not present

## 2017-01-05 DIAGNOSIS — M48061 Spinal stenosis, lumbar region without neurogenic claudication: Secondary | ICD-10-CM | POA: Diagnosis not present

## 2017-03-27 ENCOUNTER — Telehealth: Payer: Self-pay | Admitting: Family Medicine

## 2017-03-27 MED ORDER — PREDNISONE 20 MG PO TABS: ORAL_TABLET | ORAL | 0 refills | Status: DC

## 2017-03-27 MED FILL — predniSONE 20 MG TABS: 20 | 9 days supply | Qty: 18 | Fill #0

## 2017-03-27 NOTE — Telephone Encounter (Signed)
Patient has poison oak on his legs and feet.  He is requesting Rx to be called in.  [name]

## 2017-03-27 NOTE — Telephone Encounter (Signed)
Please confirm with the patient that he would like to do prednisone tablet? If so prednisone, 20 mg, #18,3qd for 3d then 2qd for 3d then 1qd for 3d

## 2017-03-27 NOTE — Telephone Encounter (Signed)
Spoke with patient's wife and she stated that patient does not mind taking prednisone. Informed her per Dr.Scott Luking- we can send in a prednisone taper. Patient verbalized understanding.

## 2017-04-09 ENCOUNTER — Telehealth: Payer: Self-pay | Admitting: *Deleted

## 2017-04-09 NOTE — Telephone Encounter (Signed)
Pt having chest pain in the middle of the chest. Started today. Pain comes and goes. Feels like a knot or lump in chest. Burns like fire. Consult with dr Lorin Picketscott. No appts available recommend pt go to ED. Pt notified he needs to get seen today at ED.

## 2017-04-23 ENCOUNTER — Encounter (HOSPITAL_COMMUNITY): Payer: Self-pay | Admitting: *Deleted

## 2017-04-23 ENCOUNTER — Emergency Department (HOSPITAL_COMMUNITY)
Admission: EM | Admit: 2017-04-23 | Discharge: 2017-04-23 | Disposition: A | Discharge status: Home / Self Care | Payer: 59 | Attending: Emergency Medicine | Admitting: Emergency Medicine

## 2017-04-23 ENCOUNTER — Ambulatory Visit: Payer: 59 | Admitting: Family Medicine

## 2017-04-23 ENCOUNTER — Emergency Department (HOSPITAL_COMMUNITY): Payer: 59

## 2017-04-23 DIAGNOSIS — R1013 Epigastric pain: Secondary | ICD-10-CM | POA: Diagnosis not present

## 2017-04-23 DIAGNOSIS — R079 Chest pain, unspecified: Secondary | ICD-10-CM | POA: Diagnosis not present

## 2017-04-23 DIAGNOSIS — R072 Precordial pain: Secondary | ICD-10-CM | POA: Diagnosis not present

## 2017-04-23 DIAGNOSIS — F17210 Nicotine dependence, cigarettes, uncomplicated: Secondary | ICD-10-CM | POA: Diagnosis not present

## 2017-04-23 DIAGNOSIS — R0602 Shortness of breath: Secondary | ICD-10-CM | POA: Diagnosis not present

## 2017-04-23 LAB — CBC
HCT: 42.1 % (ref 39.0–52.0)
Hemoglobin: 14.5 g/dL (ref 13.0–17.0)
MCH: 31.7 pg (ref 26.0–34.0)
MCHC: 34.4 g/dL (ref 30.0–36.0)
MCV: 91.9 fL (ref 78.0–100.0)
PLATELETS: 257 10*3/uL (ref 150–400)
RBC: 4.58 MIL/uL (ref 4.22–5.81)
RDW: 13.7 % (ref 11.5–15.5)
WBC: 6.8 10*3/uL (ref 4.0–10.5)

## 2017-04-23 LAB — BASIC METABOLIC PANEL
Anion gap: 7 (ref 5–15)
BUN: 14 mg/dL (ref 6–20)
CHLORIDE: 104 mmol/L (ref 101–111)
CO2: 27 mmol/L (ref 22–32)
CREATININE: 1.03 mg/dL (ref 0.61–1.24)
Calcium: 9.4 mg/dL (ref 8.9–10.3)
GFR calc Af Amer: >60 mL/min (ref >60)
GFR calc non Af Amer: >60 mL/min (ref >60)
Glucose, Bld: 124 mg/dL — ABNORMAL HIGH (ref 65–99)
Potassium: 3.7 mmol/L (ref 3.5–5.1)
SODIUM: 138 mmol/L (ref 135–145)

## 2017-04-23 LAB — TROPONIN I
Troponin I: <0.03 ng/mL (ref <0.03)
Troponin I: <0.03 ng/mL (ref <0.03)

## 2017-04-23 LAB — LIPASE, BLOOD: Lipase: 30 U/L (ref 11–51)

## 2017-04-23 MED ORDER — GI COCKTAIL ~~LOC~~
30.0000 mL | Freq: Once | ORAL | Status: AC
  Administered 2017-04-23: 30 mL via ORAL
  Filled 2017-04-23: qty 30

## 2017-04-23 MED ORDER — ESOMEPRAZOLE MAGNESIUM 40 MG PO CPDR: 40.0000 mg | DELAYED_RELEASE_CAPSULE | Freq: Every day | ORAL | 0 refills | Status: DC

## 2017-04-23 MED ORDER — TRAMADOL HCL 50 MG PO TABS: 50.0000 mg | ORAL_TABLET | Freq: Four times a day (QID) | ORAL | 0 refills | Status: DC | PRN

## 2017-04-23 MED ORDER — TRAMADOL HCL 50 MG PO TABS
50.0000 mg | ORAL_TABLET | Freq: Once | ORAL | Status: AC
  Administered 2017-04-23: 50 mg via ORAL
  Filled 2017-04-23: qty 1

## 2017-04-23 MED ORDER — SUCRALFATE 1 GM/10ML PO SUSP: 1.0000 g | Freq: Three times a day (TID) | ORAL | 0 refills | Status: DC

## 2017-04-23 NOTE — ED Triage Notes (Signed)
Pt comes in with central chest pain and epigastric pain for 1 week. Pt has had diarrhea for 2 days. Pt has taken antiacids with no relief.
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
## 2017-04-23 NOTE — Discharge Instructions (Signed)
You have been seen in the Emergency Department (ED) for chest and abdominal pain.  Your evaluation did not identify a clear cause of your symptoms but was generally reassuring.  Please follow up as instructed above regarding today?s emergent visit and the symptoms that are bothering you.  Return to the ED if your abdominal/chest pain worsens or fails to improve, you develop bloody vomiting, bloody diarrhea, you are unable to tolerate fluids due to vomiting, fever greater than 101, or other symptoms that concern you.

## 2017-04-24 MED FILL — traMADol HCL 50 MG TABS: 50 | 3 days supply | Qty: 15 | Fill #0

## 2017-04-24 MED FILL — CARAFATE 1 GM/10 ML SUSP: 1 | 10 days supply | Qty: 420 | Fill #0

## 2017-04-24 MED FILL — ESOMEPRAZOLE MAG DR 40 MG C: 40 | 30 days supply | Qty: 30 | Fill #0

## 2017-04-29 ENCOUNTER — Ambulatory Visit: Payer: 59 | Admitting: Nurse Practitioner

## 2017-06-11 ENCOUNTER — Ambulatory Visit: Payer: 59 | Admitting: Nurse Practitioner

## 2017-06-15 ENCOUNTER — Emergency Department (HOSPITAL_COMMUNITY)
Admission: EM | Admit: 2017-06-15 | Discharge: 2017-06-15 | Disposition: A | Discharge status: Home / Self Care | Payer: 59 | Attending: Emergency Medicine | Admitting: Emergency Medicine

## 2017-06-15 ENCOUNTER — Other Ambulatory Visit: Payer: Self-pay

## 2017-06-15 ENCOUNTER — Encounter (HOSPITAL_COMMUNITY): Payer: Self-pay | Admitting: *Deleted

## 2017-06-15 ENCOUNTER — Emergency Department (HOSPITAL_COMMUNITY): Payer: 59

## 2017-06-15 DIAGNOSIS — F17210 Nicotine dependence, cigarettes, uncomplicated: Secondary | ICD-10-CM | POA: Diagnosis not present

## 2017-06-15 DIAGNOSIS — Z79899 Other long term (current) drug therapy: Secondary | ICD-10-CM | POA: Insufficient documentation

## 2017-06-15 DIAGNOSIS — M7022 Olecranon bursitis, left elbow: Secondary | ICD-10-CM | POA: Insufficient documentation

## 2017-06-15 DIAGNOSIS — Y9389 Activity, other specified: Secondary | ICD-10-CM | POA: Insufficient documentation

## 2017-06-15 DIAGNOSIS — M7989 Other specified soft tissue disorders: Secondary | ICD-10-CM | POA: Diagnosis not present

## 2017-06-15 DIAGNOSIS — M25522 Pain in left elbow: Secondary | ICD-10-CM | POA: Diagnosis not present

## 2017-06-15 MED ORDER — IBUPROFEN 400 MG PO TABS
400.0000 mg | ORAL_TABLET | Freq: Once | ORAL | Status: AC
  Administered 2017-06-15: 400 mg via ORAL
  Filled 2017-06-15: qty 1

## 2017-06-15 MED ORDER — SULFAMETHOXAZOLE-TRIMETHOPRIM 800-160 MG PO TABS: 1.0000 | ORAL_TABLET | Freq: Two times a day (BID) | ORAL | 0 refills | Status: AC

## 2017-06-15 MED ORDER — NAPROXEN 500 MG PO TABS: 500.0000 mg | ORAL_TABLET | Freq: Two times a day (BID) | ORAL | 0 refills | Status: DC

## 2017-06-15 MED ORDER — OXYCODONE-ACETAMINOPHEN 5-325 MG PO TABS
2.0000 | ORAL_TABLET | Freq: Once | ORAL | Status: AC
  Administered 2017-06-15: 2 via ORAL
  Filled 2017-06-15: qty 2

## 2017-06-15 NOTE — Discharge Instructions (Signed)
Protect your elbow from friction or pressure. Take the antinflammatories and antibiotics as prescribed. Follow up with Dr. Romeo AppleHarrison. Return to the ED if you develop new or worsening symptoms.

## 2017-06-15 NOTE — ED Triage Notes (Signed)
Pt c/o left elbow pain for the last couple of days; pt has pain with ROM; pt denies any obvious injury
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
## 2017-06-16 MED FILL — NAPROXEN 500 MG TABLET: 500 | 15 days supply | Qty: 30 | Fill #0

## 2017-06-16 MED FILL — SULFAMETHOXAZOLE-TMP DS TAB: 800-160 | 7 days supply | Qty: 14 | Fill #0

## 2017-06-17 ENCOUNTER — Encounter: Payer: Self-pay | Admitting: Orthopaedic Surgery

## 2017-06-17 ENCOUNTER — Ambulatory Visit (INDEPENDENT_AMBULATORY_CARE_PROVIDER_SITE_OTHER): Payer: 59 | Admitting: Orthopaedic Surgery

## 2017-06-17 ENCOUNTER — Encounter: Payer: Self-pay | Admitting: Orthopedic Surgery

## 2017-06-17 VITALS — BP 119/78 | HR 88 | Temp 97.6°F | Wt 252.0 lb

## 2017-06-17 DIAGNOSIS — M109 Gout, unspecified: Secondary | ICD-10-CM | POA: Diagnosis not present

## 2017-06-17 MED ORDER — COLCHICINE 0.6 MG PO TABS: ORAL_TABLET | ORAL | 3 refills | Status: DC

## 2017-06-17 MED ORDER — HYDROCODONE-ACETAMINOPHEN 7.5-325 MG PO TABS: ORAL_TABLET | ORAL | 0 refills | Status: DC

## 2017-06-17 MED FILL — COLCHICINE 0.6 MG TABS: 0.6 | 5 days supply | Qty: 15 | Fill #0
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
## 2017-06-18 MED FILL — HYDROCODON-APAP 7.5-325: 7.5-325 | 5 days supply | Qty: 30 | Fill #0

## 2017-06-24 ENCOUNTER — Encounter: Payer: Self-pay | Admitting: Orthopaedic Surgery

## 2017-06-24 ENCOUNTER — Ambulatory Visit (INDEPENDENT_AMBULATORY_CARE_PROVIDER_SITE_OTHER): Payer: 59 | Admitting: Orthopaedic Surgery

## 2017-06-24 VITALS — BP 137/85 | HR 78 | Temp 97.9°F | Ht 72.0 in | Wt 250.0 lb

## 2017-06-24 DIAGNOSIS — M109 Gout, unspecified: Secondary | ICD-10-CM | POA: Diagnosis not present

## 2017-06-24 MED ORDER — ALLOPURINOL 300 MG PO TABS: 300.0000 mg | ORAL_TABLET | Freq: Every day | ORAL | 5 refills | Status: DC

## 2017-06-24 MED FILL — ALLOPURINOL 300 MG TAB: 300 | 30 days supply | Qty: 30 | Fill #0
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
## 2017-06-24 NOTE — Patient Instructions (Signed)

## 2017-06-30 ENCOUNTER — Ambulatory Visit: Payer: 59 | Admitting: Gastroenterology

## 2017-07-07 ENCOUNTER — Encounter: Payer: Self-pay | Admitting: Orthopaedic Surgery

## 2017-07-07 ENCOUNTER — Ambulatory Visit (INDEPENDENT_AMBULATORY_CARE_PROVIDER_SITE_OTHER): Payer: 59 | Admitting: Orthopaedic Surgery

## 2017-07-07 VITALS — BP 139/93 | HR 78 | Temp 97.2°F | Ht 72.0 in | Wt 248.0 lb

## 2017-07-07 DIAGNOSIS — M7022 Olecranon bursitis, left elbow: Secondary | ICD-10-CM | POA: Diagnosis not present

## 2017-07-07 MED ORDER — HYDROCODONE-ACETAMINOPHEN 7.5-325 MG PO TABS
ORAL_TABLET | ORAL | 0 refills | Status: DC
Start: 2017-07-07 — End: 2017-08-14

## 2017-07-07 MED ORDER — NAPROXEN 500 MG PO TABS: 500.0000 mg | ORAL_TABLET | Freq: Two times a day (BID) | ORAL | 5 refills | Status: DC

## 2017-07-07 MED FILL — NAPROXEN 500 MG TABLET: 500 | 30 days supply | Qty: 60 | Fill #0
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
## 2017-07-09 MED FILL — HYDROCODON-APAP 7.5-325: 7.5-325 | 5 days supply | Qty: 30 | Fill #0

## 2017-07-15 ENCOUNTER — Ambulatory Visit: Payer: 59 | Admitting: Orthopaedic Surgery

## 2017-07-30 MED FILL — ESOMEPRAZOLE MAG DR 40 MG C: 40 | 90 days supply | Qty: 90 | Fill #1

## 2017-07-30 MED FILL — ALLOPURINOL 300 MG TAB: 300 | 30 days supply | Qty: 30 | Fill #1

## 2017-08-04 HISTORY — PX: EYE SURGERY: SHX253

## 2017-08-05 ENCOUNTER — Ambulatory Visit: Payer: 59 | Admitting: Orthopaedic Surgery

## 2017-08-05 ENCOUNTER — Encounter: Payer: Self-pay | Admitting: Orthopaedic Surgery

## 2017-08-12 ENCOUNTER — Encounter: Payer: Self-pay | Admitting: Family Medicine

## 2017-08-14 ENCOUNTER — Encounter: Payer: Self-pay | Admitting: Gastroenterology

## 2017-08-14 ENCOUNTER — Ambulatory Visit (INDEPENDENT_AMBULATORY_CARE_PROVIDER_SITE_OTHER): Payer: 59 | Admitting: Gastroenterology

## 2017-08-14 ENCOUNTER — Other Ambulatory Visit: Payer: Self-pay

## 2017-08-14 VITALS — BP 129/87 | HR 80 | Temp 98.2°F | Ht 72.0 in | Wt 246.4 lb

## 2017-08-14 DIAGNOSIS — R1310 Dysphagia, unspecified: Secondary | ICD-10-CM

## 2017-08-14 DIAGNOSIS — K219 Gastro-esophageal reflux disease without esophagitis: Secondary | ICD-10-CM

## 2017-08-14 DIAGNOSIS — Z1211 Encounter for screening for malignant neoplasm of colon: Secondary | ICD-10-CM | POA: Diagnosis not present

## 2017-08-14 MED ORDER — PEG 3350-KCL-NA BICARB-NACL 420 G PO SOLR: 4000.0000 mL | ORAL | 0 refills | Status: DC

## 2017-08-14 NOTE — Assessment & Plan Note (Signed)
Continue Nexium once daily, 30 minutes before breakfast.
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
## 2017-08-14 NOTE — Assessment & Plan Note (Signed)
57 year old male with several year history of dysphagia in the setting of chronic GERD. No prior EGD. Currently taking Nexium daily for past few months but admits that he sometimes is unable to keep with strict dosing schedule. Several risk factors for Barrett's. Needs EGD with dilation in the near future.   Proceed with upper endoscopy/dilation in the near future with Dr. Jena Gaussourk. The risks, benefits, and alternatives have been discussed in detail with patient. They have stated understanding and desire to proceed.  Propofol: patient has been on intermittent narcotics due to gout flares, drinks socially.  Discussed Nexium each morning 30 minutes before breakfast as most effective

## 2017-08-14 NOTE — Patient Instructions (Signed)
Continue the Nexium daily, taking it 30 minutes before the first meal of the day on an empty stomach.  We have scheduled you for an upper endoscopy with dilation if needed in near future. Colonoscopy will be done as well for routine screening purposes.  Further recommendations to follow!  It was a pleasure to see you today. I strive to create trusting relationships with patients to provide genuine, compassionate, and quality care. I value your feedback. If you receive a survey regarding your visit,  I greatly appreciate you the taking time to fill this out.   Gelene MinkAnna W. Jenan Ellegood, PhD, ANP-BC [facility]

## 2017-08-14 NOTE — Assessment & Plan Note (Signed)
He is overdue for initial screening colonoscopy. No FH of colorectal cancer or polyps. No concerning lower GI signs/symptoms.  Proceed with TCS with Dr. Jena Gaussourk in near future: the risks, benefits, and alternatives have been discussed with the patient in detail. The patient states understanding and desires to proceed. Propofol

## 2017-08-17 ENCOUNTER — Telehealth: Payer: Self-pay

## 2017-08-17 NOTE — Telephone Encounter (Signed)
PA info for TCS/EGD/DIL submitted via [facility].

## 2017-08-17 NOTE — Telephone Encounter (Signed)
Called and informed pt's wife of his pre-op appt 09/01/17 at 8:00am. Letter mailed.

## 2017-08-17 NOTE — Progress Notes (Signed)
cc'ed to pcp °

## 2017-08-18 NOTE — Telephone Encounter (Signed)
[facility] called. No PA needed for TCS/EGD/DIL.

## 2017-08-27 ENCOUNTER — Telehealth: Payer: Self-pay | Admitting: Family Medicine

## 2017-08-27 NOTE — Telephone Encounter (Signed)
Patient is requesting Rx for pain medicine to last him until his visit on 09/09/17.  He said Dr. Lorin PicketScott discussed this with him already during his wife's visit this morning.

## 2017-08-28 MED ORDER — HYDROCODONE-ACETAMINOPHEN 7.5-325 MG PO TABS: 1.0000 | ORAL_TABLET | ORAL | 0 refills | Status: DC | PRN

## 2017-08-28 NOTE — Telephone Encounter (Signed)
Drug registry was checked.hydrocodone 7.5/325,#28, I q4 hours prn keep appt in Feb

## 2017-08-28 NOTE — Telephone Encounter (Signed)
Patient came in before lunch today to check on this message.  Marcus Hayes said Marcus Hayes was under the impression that Dr. Lorin PicketScott was going to have this ready for him yesterday by the conversation in the room.  Please advise.

## 2017-08-28 NOTE — Telephone Encounter (Signed)
Prescription up front fro pick up. Patient notified.

## 2017-08-28 NOTE — Telephone Encounter (Signed)
plz have dr Brett Canalessteve sign ,give to pt, pt to keep ov

## 2017-08-31 MED FILL — GAVILYTE-N SOLUTION: 420 | 2 days supply | Qty: 4000 | Fill #0

## 2017-08-31 MED FILL — HYDROCODON-APAP 7.5-325: 7.5-325 | 4 days supply | Qty: 28 | Fill #0

## 2017-08-31 MED FILL — COLCHICINE 0.6 MG TABS: 0.6 | 5 days supply | Qty: 15 | Fill #1

## 2017-08-31 NOTE — Patient Instructions (Signed)
Marcus Hayes  08/31/2017     @PREFPERIOPPHARMACY @   Your procedure is scheduled on 09/10/2017.  [facility]  Call this number if you have problems the morning of surgery:  [phone]   Remember:  Do not eat food or drink liquids after midnight.  Take these medicines the morning of surgery with A SIP OF WATER Allopurinol, Nexium, Norco if needed   Do not wear jewelry, make-up or nail polish.  Do not wear lotions, powders, or perfumes, or deodorant.  Do not shave 48 hours prior to surgery.  Men may shave face and neck.  Do not bring valuables to the hospital.  [facility] not [facility] for any belongings or valuables.  Contacts, dentures or bridgework may not be worn into surgery.  Leave your suitcase in the car.  After surgery it may be brought to your room.  For patients admitted to the hospital, discharge time will be determined by your treatment team.  Patients discharged the day of surgery will not be allowed to drive home.    Please read over the following fact sheets that you were given. Anesthesia Post-op Instructions     PATIENT INSTRUCTIONS POST-ANESTHESIA  IMMEDIATELY FOLLOWING SURGERY:  Do not drive or operate machinery for the first twenty four hours after surgery.  Do not make any important decisions for twenty four hours after surgery or while taking narcotic pain medications or sedatives.  If you develop intractable nausea and vomiting or a severe headache please notify your doctor immediately.  FOLLOW-UP:  Please make an appointment with your surgeon as instructed. You do not need to follow up with anesthesia unless specifically instructed to do so.  WOUND CARE INSTRUCTIONS (if applicable):  Keep a dry clean dressing on the anesthesia/puncture wound site if there is drainage.  Once the wound has quit draining you may leave it open to air.  Generally you should leave the bandage intact for twenty four hours unless there is  drainage.  If the epidural site drains for more than 36-48 hours please call the anesthesia department.  QUESTIONS?:  Please feel free to call your physician or the hospital operator if you have any questions, and they will be happy to assist you.      Esophagogastroduodenoscopy Esophagogastroduodenoscopy (EGD) is a procedure to examine the lining of the esophagus, stomach, and first part of the small intestine (duodenum). This procedure is done to check for problems such as inflammation, bleeding, ulcers, or growths. During this procedure, a long, flexible, lighted tube with a camera attached (endoscope) is inserted down the throat. Tell a health care provider about:  Any allergies you have.  All medicines you are taking, including vitamins, herbs, eye drops, creams, and over-the-counter medicines.  Any problems you or family members have had with anesthetic medicines.  Any blood disorders you have.  Any surgeries you have had.  Any medical conditions you have.  Whether you are pregnant or may be pregnant. What are the risks? Generally, this is a safe procedure. However, problems may occur, including:  Infection.  Bleeding.  A tear (perforation) in the esophagus, stomach, or duodenum.  Trouble breathing.  Excessive sweating.  Spasms of the larynx.  A slowed heartbeat.  Low blood pressure.  What happens before the procedure?  Follow instructions from your health care provider about eating or drinking restrictions.  Ask your health care provider about: ? Changing or stopping your regular medicines. This is especially important if  you are taking diabetes medicines or blood thinners. ? Taking medicines such as aspirin and ibuprofen. These medicines can thin your blood. Do not take these medicines before your procedure if your health care provider instructs you not to.  Plan to have someone take you home after the procedure.  If you wear dentures, be ready to remove  them before the procedure. What happens during the procedure?  To reduce your risk of infection, your health care team will wash or sanitize their hands.  An IV tube will be put in a vein in your hand or arm. You will get medicines and fluids through this tube.  You will be given one or more of the following: ? A medicine to help you relax (sedative). ? A medicine to numb the area (local anesthetic). This medicine may be sprayed into your throat. It will make you feel more comfortable and keep you from gagging or coughing during the procedure. ? A medicine for pain.  A mouth guard may be placed in your mouth to protect your teeth and to keep you from biting on the endoscope.  You will be asked to lie on your left side.  The endoscope will be lowered down your throat into your esophagus, stomach, and duodenum.  Air will be put into the endoscope. This will help your health care provider see better.  The lining of your esophagus, stomach, and duodenum will be examined.  Your health care provider may: ? Take a tissue sample so it can be looked at in a lab (biopsy). ? Remove growths. ? Remove objects (foreign bodies) that are stuck. ? Treat any bleeding with medicines or other devices that stop tissue from bleeding. ? Widen (dilate) or stretch narrowed areas of your esophagus and stomach.  The endoscope will be taken out. The procedure may vary among health care providers and hospitals. What happens after the procedure?  Your blood pressure, heart rate, breathing rate, and blood oxygen level will be monitored often until the medicines you were given have worn off.  Do not eat or drink anything until the numbing medicine has worn off and your gag reflex has returned. This information is not intended to replace advice given to you by your health care provider. Make sure you discuss any questions you have with your health care provider. Document Released: 11/21/2004 Document Revised:  12/27/2015 Document Reviewed: 06/14/2015 Elsevier Interactive Patient Education  2018 [employer]. Esophageal Dilatation Esophageal dilatation is a procedure to open a blocked or narrowed part of the esophagus. The esophagus is the long tube in your throat that carries food and liquid from your mouth to your stomach. The procedure is also called esophageal dilation. You may need this procedure if you have a buildup of scar tissue in your esophagus that makes it difficult, painful, or even impossible to swallow. This can be caused by gastroesophageal reflux disease (GERD). In rare cases, people need this procedure because they have cancer of the esophagus or a problem with the way food moves through the esophagus. Sometimes you may need to have another dilatation to enlarge the opening of the esophagus gradually. Tell a health care provider about:  Any allergies you have.  All medicines you are taking, including vitamins, herbs, eye drops, creams, and over-the-counter medicines.  Any problems you or family members have had with anesthetic medicines.  Any blood disorders you have.  Any surgeries you have had.  Any medical conditions you have.  Any antibiotic medicines you are  required to take before dental procedures. What are the risks? Generally, this is a safe procedure. However, problems can occur and include:  Bleeding from a tear in the lining of the esophagus.  A hole (perforation) in the esophagus.  What happens before the procedure?  Do not eat or drink anything after midnight on the night before the procedure or as directed by your health care provider.  Ask your health care provider about changing or stopping your regular medicines. This is especially important if you are taking diabetes medicines or blood thinners.  Plan to have someone take you home after the procedure. What happens during the procedure?  You will be given a medicine that makes you relaxed and sleepy  (sedative).  A medicine may be sprayed or gargled to numb the back of the throat.  Your health care provider can use various instruments to do an esophageal dilatation. During the procedure, the instrument used will be placed in your mouth and passed down into your esophagus. Options include: ? Simple dilators. This instrument is carefully placed in the esophagus to stretch it. ? Guided wire bougies. In this method, a flexible tube (endoscope) is used to insert a wire into the esophagus. The dilator is passed over this wire to enlarge the esophagus. Then the wire is removed. ? Balloon dilators. An endoscope with a small balloon at the end is passed down into the esophagus. Inflating the balloon gently stretches the esophagus and opens it up. What happens after the procedure?  Your blood pressure, heart rate, breathing rate, and blood oxygen level will be monitored often until the medicines you were given have worn off.  Your throat may feel slightly sore and will probably still feel numb. This will improve slowly over time.  You will not be allowed to eat or drink until the throat numbness has resolved.  If this is a same-day procedure, you may be allowed to go home once you have been able to drink, urinate, and sit on the edge of the bed without nausea or dizziness.  If this is a same-day procedure, you should have a friend or family member with you for the next 24 hours after the procedure. This information is not intended to replace advice given to you by your health care provider. Make sure you discuss any questions you have with your health care provider. Document Released: 09/11/2005 Document Revised: 12/27/2015 Document Reviewed: 11/30/2013 Elsevier Interactive Patient Education  [employer]. Colonoscopy, Adult A colonoscopy is an exam to look at the entire large intestine. During the exam, a lubricated, bendable tube is inserted into the anus and then passed into the rectum,  colon, and other parts of the large intestine. A colonoscopy is often done as a part of normal colorectal screening or in response to certain symptoms, such as anemia, persistent diarrhea, abdominal pain, and blood in the stool. The exam can help screen for and diagnose medical problems, including:  Tumors.  Polyps.  Inflammation.  Areas of bleeding.  Tell a health care provider about:  Any allergies you have.  All medicines you are taking, including vitamins, herbs, eye drops, creams, and over-the-counter medicines.  Any problems you or family members have had with anesthetic medicines.  Any blood disorders you have.  Any surgeries you have had.  Any medical conditions you have.  Any problems you have had passing stool. What are the risks? Generally, this is a safe procedure. However, problems may occur, including:  Bleeding.  A  tear in the intestine.  A reaction to medicines given during the exam.  Infection (rare).  What happens before the procedure? Eating and drinking restrictions Follow instructions from your health care provider about eating and drinking, which may include:  A few days before the procedure - follow a low-fiber diet. Avoid nuts, seeds, dried fruit, raw fruits, and vegetables.  1-3 days before the procedure - follow a clear liquid diet. Drink only clear liquids, such as clear broth or bouillon, black coffee or tea, clear juice, clear soft drinks or sports drinks, gelatin dessert, and popsicles. Avoid any liquids that contain red or purple dye.  On the day of the procedure - do not eat or drink anything during the 2 hours before the procedure, or within the time period that your health care provider recommends.  Bowel prep If you were prescribed an oral bowel prep to clean out your colon:  Take it as told by your health care provider. Starting the day before your procedure, you will need to drink a large amount of medicated liquid. The liquid  will cause you to have multiple loose stools until your stool is almost clear or light green.  If your skin or anus gets irritated from diarrhea, you may use these to relieve the irritation: ? Medicated wipes, such as adult wet wipes with aloe and vitamin E. ? A skin soothing-product like petroleum jelly.  If you vomit while drinking the bowel prep, take a break for up to 60 minutes and then begin the bowel prep again. If vomiting continues and you cannot take the bowel prep without vomiting, call your health care provider.  General instructions  Ask your health care provider about changing or stopping your regular medicines. This is especially important if you are taking diabetes medicines or blood thinners.  Plan to have someone take you home from the hospital or clinic. What happens during the procedure?  An IV tube may be inserted into one of your veins.  You will be given medicine to help you relax (sedative).  To reduce your risk of infection: ? Your health care team will wash or sanitize their hands. ? Your anal area will be washed with soap.  You will be asked to lie on your side with your knees bent.  Your health care provider will lubricate a long, thin, flexible tube. The tube will have a camera and a light on the end.  The tube will be inserted into your anus.  The tube will be gently eased through your rectum and colon.  Air will be delivered into your colon to keep it open. You may feel some pressure or cramping.  The camera will be used to take images during the procedure.  A small tissue sample may be removed from your body to be examined under a microscope (biopsy). If any potential problems are found, the tissue will be sent to a lab for testing.  If small polyps are found, your health care provider may remove them and have them checked for cancer cells.  The tube that was inserted into your anus will be slowly removed. The procedure may vary among health  care providers and hospitals. What happens after the procedure?  Your blood pressure, heart rate, breathing rate, and blood oxygen level will be monitored until the medicines you were given have worn off.  Do not drive for 24 hours after the exam.  You may have a small amount of blood in your stool.  You  may pass gas and have mild abdominal cramping or bloating due to the air that was used to inflate your colon during the exam.  It is up to you to get the results of your procedure. Ask your health care provider, or the department performing the procedure, when your results will be ready. This information is not intended to replace advice given to you by your health care provider. Make sure you discuss any questions you have with your health care provider. Document Released: 07/18/2000 Document Revised: 05/21/2016 Document Reviewed: 10/02/2015 Elsevier Interactive Patient Education  2018 [employer].

## 2017-09-01 ENCOUNTER — Encounter (HOSPITAL_COMMUNITY): Payer: Self-pay

## 2017-09-01 ENCOUNTER — Other Ambulatory Visit: Payer: Self-pay

## 2017-09-01 ENCOUNTER — Encounter (HOSPITAL_COMMUNITY)
Admission: RE | Admit: 2017-09-01 | Discharge: 2017-09-01 | Disposition: A | Discharge status: Home / Self Care | Payer: 59 | Source: Ambulatory Visit | Attending: Internal Medicine | Admitting: Internal Medicine

## 2017-09-01 DIAGNOSIS — Z01812 Encounter for preprocedural laboratory examination: Secondary | ICD-10-CM | POA: Insufficient documentation

## 2017-09-01 LAB — BASIC METABOLIC PANEL
Anion gap: 10 (ref 5–15)
BUN: 17 mg/dL (ref 6–20)
CO2: 22 mmol/L (ref 22–32)
CREATININE: 0.97 mg/dL (ref 0.61–1.24)
Calcium: 9.2 mg/dL (ref 8.9–10.3)
Chloride: 105 mmol/L (ref 101–111)
GLUCOSE: 99 mg/dL (ref 65–99)
Potassium: 3.9 mmol/L (ref 3.5–5.1)
Sodium: 137 mmol/L (ref 135–145)

## 2017-09-01 LAB — CBC
HEMATOCRIT: 45.1 % (ref 39.0–52.0)
Hemoglobin: 15 g/dL (ref 13.0–17.0)
MCH: 30.4 pg (ref 26.0–34.0)
MCHC: 33.3 g/dL (ref 30.0–36.0)
MCV: 91.5 fL (ref 78.0–100.0)
PLATELETS: 223 10*3/uL (ref 150–400)
RBC: 4.93 MIL/uL (ref 4.22–5.81)
RDW: 14 % (ref 11.5–15.5)
WBC: 6.3 10*3/uL (ref 4.0–10.5)

## 2017-09-08 MED FILL — ALLOPURINOL 300 MG TAB: 300 | 30 days supply | Qty: 30 | Fill #2

## 2017-09-09 ENCOUNTER — Encounter: Payer: Self-pay | Admitting: Family Medicine

## 2017-09-09 ENCOUNTER — Ambulatory Visit: Payer: 59 | Admitting: Family Medicine

## 2017-09-09 VITALS — BP 110/78 | Ht 72.0 in | Wt 233.0 lb

## 2017-09-09 DIAGNOSIS — M545 Low back pain: Secondary | ICD-10-CM | POA: Diagnosis not present

## 2017-09-09 DIAGNOSIS — G8929 Other chronic pain: Secondary | ICD-10-CM

## 2017-09-09 MED ORDER — HYDROCODONE-ACETAMINOPHEN 7.5-325 MG PO TABS: 1.0000 | ORAL_TABLET | ORAL | 0 refills | Status: DC | PRN

## 2017-09-09 NOTE — Progress Notes (Signed)
   Subjective:    Patient ID: Marcus Hayes, male    DOB: 1961-01-25, 57 y.o.   MRN: [id number]  HPI This patient was seen today for chronic pain Takes for back pain.   The medication list was reviewed and updated.   -Compliance with medication:   - Number patient states they take daily: as needed   -when was the last dose patient took? 3 days ago  The patient was advised the importance of maintaining medication and not using illegal substances with these.  Here for refills and follow up  The patient was educated that we can provide 3 monthly scripts for their medication, it is their responsibility to follow the instructions.  Side effects or complications from medications: none  Patient is aware that pain medications are meant to minimize the severity of the pain to allow their pain levels to improve to allow for better function. They are aware of that pain medications cannot totally remove their pain.  Due for UDT ( at least once per year) : due today but has not taken pain med in 3 days  Pain medication assessment was done He does not have a history of pain medicine abuse No history of addiction Denies being depressed Uses pain medicine infrequently Does have chronic low back pain with disc disease and spinal stenosis      Review of Systems  Constitutional: Negative for activity change.  HENT: Negative for congestion and rhinorrhea.   Respiratory: Negative for cough and shortness of breath.   Cardiovascular: Negative for chest pain.  Gastrointestinal: Negative for abdominal pain, diarrhea, nausea and vomiting.  Genitourinary: Negative for dysuria and hematuria.  Neurological: Negative for weakness and headaches.  Psychiatric/Behavioral: Negative for confusion.       Objective:   Physical Exam  Constitutional: He appears well-nourished.  Cardiovascular: Normal rate, regular rhythm and normal heart sounds.  No murmur heard. Pulmonary/Chest: Effort normal and  breath sounds normal.  Musculoskeletal: He exhibits no edema.  Lymphadenopathy:    He has no cervical adenopathy.  Neurological: He is alert.  Psychiatric: His behavior is normal.  Vitals reviewed.   Tight hamstrings Range of motion exercises were shown Pamphlet regarding exercises given Patient states specialist is going to do some injections Patient relates that he does not want to do physical therapy      Assessment & Plan:  Patient was given a prescription of pain medicine he can get them next month he was told that if he needs ongoing pain medicine on a regular basis he will need an office visit every 3 months to prescribe He denies abusing the medicine he was made aware of the potential for side effects and the potential for addiction There is no need to do a urine drug screen today To follow-up in the spring time for lab work and a wellness exam

## 2017-09-09 NOTE — Patient Instructions (Signed)

## 2017-09-10 ENCOUNTER — Ambulatory Visit (HOSPITAL_COMMUNITY)
Admission: RE | Admit: 2017-09-10 | Discharge: 2017-09-10 | Disposition: A | Discharge status: Home / Self Care | Payer: 59 | Source: Ambulatory Visit | Attending: Internal Medicine | Admitting: Internal Medicine

## 2017-09-10 ENCOUNTER — Encounter: Payer: Self-pay | Admitting: Internal Medicine

## 2017-09-10 ENCOUNTER — Telehealth: Payer: Self-pay

## 2017-09-10 ENCOUNTER — Ambulatory Visit (HOSPITAL_COMMUNITY): Payer: 59 | Admitting: Anesthesiology

## 2017-09-10 ENCOUNTER — Encounter (HOSPITAL_COMMUNITY): Payer: Self-pay | Admitting: *Deleted

## 2017-09-10 ENCOUNTER — Encounter (HOSPITAL_COMMUNITY)
Admission: RE | Disposition: A | Discharge status: Home / Self Care | Payer: Self-pay | Source: Ambulatory Visit | Attending: Internal Medicine

## 2017-09-10 DIAGNOSIS — K228 Other specified diseases of esophagus: Secondary | ICD-10-CM | POA: Diagnosis not present

## 2017-09-10 DIAGNOSIS — K219 Gastro-esophageal reflux disease without esophagitis: Secondary | ICD-10-CM | POA: Diagnosis not present

## 2017-09-10 DIAGNOSIS — Z1211 Encounter for screening for malignant neoplasm of colon: Secondary | ICD-10-CM | POA: Insufficient documentation

## 2017-09-10 DIAGNOSIS — K222 Esophageal obstruction: Secondary | ICD-10-CM | POA: Diagnosis not present

## 2017-09-10 DIAGNOSIS — F17210 Nicotine dependence, cigarettes, uncomplicated: Secondary | ICD-10-CM | POA: Insufficient documentation

## 2017-09-10 DIAGNOSIS — Z79891 Long term (current) use of opiate analgesic: Secondary | ICD-10-CM | POA: Insufficient documentation

## 2017-09-10 DIAGNOSIS — K449 Diaphragmatic hernia without obstruction or gangrene: Secondary | ICD-10-CM | POA: Diagnosis not present

## 2017-09-10 DIAGNOSIS — M109 Gout, unspecified: Secondary | ICD-10-CM | POA: Insufficient documentation

## 2017-09-10 DIAGNOSIS — Z79899 Other long term (current) drug therapy: Secondary | ICD-10-CM | POA: Diagnosis not present

## 2017-09-10 DIAGNOSIS — M1990 Unspecified osteoarthritis, unspecified site: Secondary | ICD-10-CM | POA: Diagnosis not present

## 2017-09-10 DIAGNOSIS — G8929 Other chronic pain: Secondary | ICD-10-CM | POA: Diagnosis not present

## 2017-09-10 DIAGNOSIS — M549 Dorsalgia, unspecified: Secondary | ICD-10-CM | POA: Diagnosis not present

## 2017-09-10 DIAGNOSIS — R1310 Dysphagia, unspecified: Secondary | ICD-10-CM

## 2017-09-10 DIAGNOSIS — K5730 Diverticulosis of large intestine without perforation or abscess without bleeding: Secondary | ICD-10-CM | POA: Insufficient documentation

## 2017-09-10 HISTORY — PX: COLONOSCOPY WITH PROPOFOL: SHX5780

## 2017-09-10 HISTORY — PX: ESOPHAGOGASTRODUODENOSCOPY (EGD) WITH PROPOFOL: SHX5813

## 2017-09-10 HISTORY — PX: MALONEY DILATION: SHX5535

## 2017-09-10 SURGERY — COLONOSCOPY WITH PROPOFOL
Anesthesia: Monitor Anesthesia Care

## 2017-09-10 MED ORDER — LIDOCAINE VISCOUS 2 % MT SOLN
OROMUCOSAL | Status: DC | PRN
Start: 2017-09-10 — End: 2017-09-10
  Administered 2017-09-10: 4 mL via OROMUCOSAL

## 2017-09-10 MED ORDER — CHLORHEXIDINE GLUCONATE CLOTH 2 % EX PADS: 6.0000 | MEDICATED_PAD | Freq: Once | CUTANEOUS | Status: DC

## 2017-09-10 MED ORDER — PROPOFOL 10 MG/ML IV BOLUS
INTRAVENOUS | Status: AC
  Filled 2017-09-10: qty 20

## 2017-09-10 MED ORDER — FENTANYL CITRATE (PF) 100 MCG/2ML IJ SOLN
INTRAMUSCULAR | Status: AC
  Filled 2017-09-10: qty 2

## 2017-09-10 MED ORDER — MIDAZOLAM HCL 2 MG/2ML IJ SOLN
INTRAMUSCULAR | Status: AC
  Filled 2017-09-10: qty 2

## 2017-09-10 MED ORDER — PROPOFOL 10 MG/ML IV BOLUS
INTRAVENOUS | Status: AC
  Filled 2017-09-10: qty 40

## 2017-09-10 MED ORDER — PROPOFOL 10 MG/ML IV BOLUS
INTRAVENOUS | Status: DC | PRN
  Administered 2017-09-10 (×2): 50 mg via INTRAVENOUS

## 2017-09-10 MED ORDER — MIDAZOLAM HCL 2 MG/2ML IJ SOLN
1.0000 mg | INTRAMUSCULAR | Status: AC
  Administered 2017-09-10: 2 mg via INTRAVENOUS

## 2017-09-10 MED ORDER — PROPOFOL 500 MG/50ML IV EMUL
INTRAVENOUS | Status: DC | PRN
  Administered 2017-09-10: 100 ug/kg/min via INTRAVENOUS

## 2017-09-10 MED ORDER — FENTANYL CITRATE (PF) 100 MCG/2ML IJ SOLN
25.0000 ug | Freq: Once | INTRAMUSCULAR | Status: AC
  Administered 2017-09-10: 25 ug via INTRAVENOUS

## 2017-09-10 MED ORDER — LIDOCAINE VISCOUS 2 % MT SOLN
OROMUCOSAL | Status: AC
  Filled 2017-09-10: qty 15

## 2017-09-10 MED ORDER — LACTATED RINGERS IV SOLN
INTRAVENOUS | Status: DC
  Administered 2017-09-10: 08:00:00 via INTRAVENOUS

## 2017-09-10 NOTE — Anesthesia Preprocedure Evaluation (Signed)
Anesthesia Evaluation  Patient identified by MRN, date of birth, ID band Patient awake    History of Anesthesia Complications (+) Family history of anesthesia reaction and history of anesthetic complications ( "Family history of adverse reaction to anesthesia  father hallucinated after first surgery. Thought to be allergic to one of the medications ")  Airway Mallampati: I  TM Distance: >3 FB Neck ROM: Full    Dental  (+) Teeth Intact   Pulmonary Current Smoker,    breath sounds clear to auscultation       Cardiovascular negative cardio ROS   Rhythm:Regular Rate:Normal     Neuro/Psych negative psych ROS   GI/Hepatic GERD (dysphagia)  Medicated and Controlled,  Endo/Other    Renal/GU      Musculoskeletal  (+) Arthritis , Chronic back pain - hydrocodone   Abdominal   Peds  Hematology   Anesthesia Other Findings   Reproductive/Obstetrics                             Anesthesia Physical Anesthesia Plan  ASA: II  Anesthesia Plan: MAC   Post-op Pain Management:    Induction: Intravenous  PONV Risk Score and Plan:   Airway Management Planned: Simple Face Mask  Additional Equipment:   Intra-op Plan:   Post-operative Plan:   Informed Consent: I have reviewed the patients History and Physical, chart, labs and discussed the procedure including the risks, benefits and alternatives for the proposed anesthesia with the patient or authorized representative who has indicated his/her understanding and acceptance.     Plan Discussed with:   Anesthesia Plan Comments:         Anesthesia Quick Evaluation

## 2017-09-10 NOTE — Interval H&P Note (Signed)
History and Physical Interval Note:  09/10/2017 8:52 AM  Celesta [facility] Dale Blackard  has presented today for surgery, with the diagnosis of screening colonoscopy, dysphagia  The various methods of treatment have been discussed with the patient and family. After consideration of risks, benefits and other options for treatment, the patient has consented to  Procedure(s) with comments: COLONOSCOPY WITH PROPOFOL (N/A) - 9:30am ESOPHAGOGASTRODUODENOSCOPY (EGD) WITH PROPOFOL (N/A) MALONEY DILATION (N/A) as a surgical intervention .  The patient's history has been reviewed, patient examined, no change in status, stable for surgery.  I have reviewed the patient's chart and labs.  Questions were answered to the patient's satisfaction.     Tysheena Ginzburg  No change. EGD with possible esophageal dilation and colonoscopy today per plan.  The risks, benefits, limitations, imponderables and alternatives regarding both EGD and colonoscopy have been reviewed with the patient. Questions have been answered. All parties agreeable.

## 2017-09-10 NOTE — Telephone Encounter (Signed)
Pt is being discharged from [facility]. Pt needs a 3 month appointment per RMR.

## 2017-09-10 NOTE — Op Note (Signed)
[facility] Patient Name: Marcus Hayes Procedure Date: 09/10/2017 9:21 AM MRN: [id number] Date of Birth: 07/10/1961 Attending MD: Gennette Pac , MD CSN: [id number] Age: 57 Admit Type: Outpatient Procedure:                Colonoscopy Indications:              Screening for colorectal malignant neoplasm Providers:                Gennette Pac, MD, Edrick Kins, RN, Burke Keels, Technician Referring MD:             Jonna Coup. Luking Medicines:                Propofol per Anesthesia Complications:            No immediate complications. Estimated Blood Loss:     Estimated blood loss: none. Procedure:                Pre-Anesthesia Assessment:                           - Prior to the procedure, a History and Physical                            was performed, and patient medications and                            allergies were reviewed. The patient's tolerance of                            previous anesthesia was also reviewed. The risks                            and benefits of the procedure and the sedation                            options and risks were discussed with the patient.                            All questions were answered, and informed consent                            was obtained. Prior Anticoagulants: The patient has                            taken no previous anticoagulant or antiplatelet                            agents. ASA Grade Assessment: II - A patient with                            mild systemic disease. After reviewing the risks  and benefits, the patient was deemed in                            satisfactory condition to undergo the procedure.                           After obtaining informed consent, the colonoscope                            was passed under direct vision. Throughout the                            procedure, the patient's blood pressure, pulse, and             oxygen saturations were monitored continuously. The                            [id number] ([id number] scope was introduced through                            the and advanced to the the cecum, identified by                            appendiceal orifice and ileocecal valve. The                            colonoscopy was performed without difficulty. The                            patient tolerated the procedure well. The quality                            of the bowel preparation was adequate. The entire                            colon was well visualized. The quality of the bowel                            preparation was adequate. The ileocecal valve,                            appendiceal orifice, and rectum were photographed. Scope In: 9:24:27 AM Scope Out: 9:34:35 AM Scope Withdrawal Time: 0 hours 6 minutes 28 seconds  Total Procedure Duration: 0 hours 10 minutes 8 seconds  Findings:      The perianal and digital rectal examinations were normal.      Scattered small and large-mouthed diverticula were found in the sigmoid       colon and descending colon.      The exam was otherwise without abnormality on direct and retroflexion       views. Impression:               - Diverticulosis in the sigmoid colon and in the  descending colon.                           - The examination was otherwise normal on direct                            and retroflexion views.                           - No specimens collected. Moderate Sedation:      Moderate (conscious) sedation was personally administered by an       anesthesia professional. The following parameters were monitored: oxygen       saturation, heart rate, blood pressure, respiratory rate, EKG, adequacy       of pulmonary ventilation, and response to care. Total physician       intraservice time was 20 minutes. Recommendation:           - Patient has a contact number available for                             emergencies. The signs and symptoms of potential                            delayed complications were discussed with the                            patient. Return to normal activities tomorrow.                            Written discharge instructions were provided to the                            patient.                           - Resume previous diet.                           - Continue present medications.                           - Repeat colonoscopy in 10 years for screening                            purposes.                           - Return to GI clinic in 3 months. Procedure Code(s):        --- Professional ---                           [phone], Colonoscopy, flexible; diagnostic, including                            collection of specimen(s) by brushing or washing,  when performed (separate procedure) Diagnosis Code(s):        --- Professional ---                           Z12.11, Encounter for screening for malignant                            neoplasm of colon                           K57.30, Diverticulosis of large intestine without                            perforation or abscess without bleeding CPT copyright 2016 American [employer] Association. All rights reserved. The codes documented in this report are preliminary and upon coder review may  be revised to meet current compliance requirements. Gerrit Friends. Koron Godeaux, MD Gennette Pac, MD 09/10/2017 9:42:25 AM This report has been signed electronically. Number of Addenda: 0

## 2017-09-10 NOTE — Discharge Instructions (Addendum)

## 2017-09-10 NOTE — Telephone Encounter (Signed)
PATIENT SCHEDULED AND LETTER SENT  °

## 2017-09-10 NOTE — Anesthesia Postprocedure Evaluation (Signed)
Anesthesia Post Note  Patient: Marcus Hayes  Procedure(s) Performed: COLONOSCOPY WITH PROPOFOL (N/A ) ESOPHAGOGASTRODUODENOSCOPY (EGD) WITH PROPOFOL (N/A ) MALONEY DILATION (N/A )     Anesthesia Post Evaluation  Last Vitals:  Vitals:   09/10/17 0854 09/10/17 0943  BP:  107/73  Pulse:  77  Resp: 15 (!) 23  Temp:  36.5 C  SpO2: 97% 95%    Last Pain:  Vitals:   09/10/17 0818  TempSrc: Oral   Pain Goal: Patients Stated Pain Goal: 5 (09/10/17 0818)               Vista LawmanBeth E Cloud Graham

## 2017-09-10 NOTE — Transfer of Care (Signed)
Immediate Anesthesia Transfer of Care Note  Patient: Marcus Hayes  Procedure(s) Performed: COLONOSCOPY WITH PROPOFOL (N/A ) ESOPHAGOGASTRODUODENOSCOPY (EGD) WITH PROPOFOL (N/A ) MALONEY DILATION (N/A )  Patient Location: PACU  Anesthesia Type:MAC  Level of Consciousness: awake, alert , oriented and patient cooperative  Airway & Oxygen Therapy: Patient Spontanous Breathing  Post-op Assessment: Report given to RN and Post -op Vital signs reviewed and stable  Post vital signs: Reviewed and stable  Last Vitals:  Vitals:   09/10/17 0850 09/10/17 0854  BP: 120/86   Pulse:    Resp: 13 15  SpO2: 97% 97%    Last Pain:  Vitals:   09/10/17 0818  TempSrc: Oral      Patients Stated Pain Goal: 5 (63/84/66 5993)  Complications: No apparent anesthesia complications

## 2017-09-10 NOTE — Addendum Note (Signed)
Addendum  created 09/10/17 1021 by Timmy Cleverly E, CRNA   Charge Capture section accepted    

## 2017-09-10 NOTE — Op Note (Signed)
[facility] Patient Name: Marcus Hayes Procedure Date: 09/10/2017 8:44 AM MRN: [id number] Date of Birth: 1960-09-11 Attending MD: Gennette Pac , MD CSN: [id number] Age: 57 Admit Type: Outpatient Procedure:                Upper GI endoscopy Indications:              Dysphagia Providers:                Gennette Pac, MD, Edrick Kins, RN, Burke Keels, Technician Referring MD:              Medicines:                Propofol per Anesthesia Complications:            No immediate complications. Estimated Blood Loss:     Estimated blood loss was minimal. Procedure:                Pre-Anesthesia Assessment:                           - Prior to the procedure, a History and Physical                            was performed, and patient medications and                            allergies were reviewed. The patient's tolerance of                            previous anesthesia was also reviewed. The risks                            and benefits of the procedure and the sedation                            options and risks were discussed with the patient.                            All questions were answered, and informed consent                            was obtained. Prior Anticoagulants: The patient has                            taken no previous anticoagulant or antiplatelet                            agents. ASA Grade Assessment: II - A patient with                            mild systemic disease. After reviewing the risks  and benefits, the patient was deemed in                            satisfactory condition to undergo the procedure.                           After obtaining informed consent, the endoscope was                            passed under direct vision. Throughout the                            procedure, the patient's blood pressure, pulse, and                            oxygen saturations were  monitored continuously. The                            [id number] ([id number]) scope was introduced through the                            and advanced to the second part of duodenum. The                            upper GI endoscopy was accomplished without                            difficulty. The patient tolerated the procedure                            well. The upper GI endoscopy was accomplished                            without difficulty. Scope In: 9:06:28 AM Scope Out: 9:18:36 AM Total Procedure Duration: 0 hours 12 minutes 8 seconds  Findings:      A moderate Schatzki ring (acquired) was found at the gastroesophageal       junction. Some longitudinal esophageal folds. No tumor. No Barrett's       epithelium seen. .      A small hiatal hernia was present.      The duodenal bulb and second portion of the duodenum were normal. The       scope was withdrawn. Dilation was performed with a Maloney dilator with       mild resistance at 54 Fr. The dilation site was examined following       endoscope reinsertion and showed mild improvement in luminal narrowing.       The scope was withdrawn. Dilation was performed with a Maloney dilator       with moderate resistance at 56 Fr. The dilation site was examined       following endoscope reinsertion and showed moderate improvement in       luminal narrowing. Estimated blood loss was minimal. I elected to take       four-quadrant "bites" of the persisting ring. This was done without       difficulty or appear complication Complication.  Finally, the mid and       distal esophagus were biopsied. Estimated blood loss was minimal Impression:               - Moderate Schatzki ring. Dilated. status post                            esophageal biopsy.                           - Small hiatal hernia.                           - Normal duodenal bulb and second portion of the                            duodenum.                           - No specimens  collected. Moderate Sedation:      Moderate (conscious) sedation was administered by the endoscopy nurse       and supervised by the endoscopist. The following parameters were       monitored: oxygen saturation, heart rate, blood pressure, respiratory       rate, EKG, adequacy of pulmonary ventilation, and response to care.       Total physician intraservice time was 20 minutes. Recommendation:           - The signs and symptoms of potential delayed                            complications were discussed with the patient.                           - Patient has a contact number available for                            emergencies.                           - Return to normal activities tomorrow.                           - Resume previous diet. Stop Nexium; begin Protonix                            40 mg twice daily. Office visit with us in 3 months                           - See colonoscopy report.                           - Await pathology results. Procedure Code(s):        --- Professional ---                           [phone], Esophagogastroduodenoscopy, flexible,  transoral; with biopsy, single or multiple                           43450, Dilation of esophagus, by unguided sound or                            bougie, single or multiple passes                           99152, Moderate sedation services provided by the                            same physician or other qualified health care                            professional performing the diagnostic or                            therapeutic service that the sedation supports,                            requiring the presence of an independent trained                            observer to assist in the monitoring of the                            patient's level of consciousness and physiological                            status; initial 15 minutes of intraservice time,                             patient age 51 years or older Diagnosis Code(s):        --- Professional ---                           K22.2, Esophageal obstruction                           K44.9, Diaphragmatic hernia without obstruction or                            gangrene                           R13.10, Dysphagia, unspecified CPT copyright 2016 American Medical Association. All rights reserved. The codes documented in this report are preliminary and upon coder review may  be revised to meet current compliance requirements. Gerrit Friends. Anastasia Tompson, MD Gennette Pac, MD 09/10/2017 9:39:19 AM This report has been signed electronically. Number of Addenda: 0

## 2017-09-13 ENCOUNTER — Encounter: Payer: Self-pay | Admitting: Internal Medicine

## 2017-09-14 ENCOUNTER — Encounter (HOSPITAL_COMMUNITY): Payer: Self-pay | Admitting: Internal Medicine

## 2017-10-02 MED FILL — HYDROCODON-APAP 7.5-325: 7.5-325 | 4 days supply | Qty: 28 | Fill #0

## 2017-10-25 ENCOUNTER — Other Ambulatory Visit: Payer: Self-pay

## 2017-10-25 ENCOUNTER — Emergency Department (HOSPITAL_COMMUNITY)
Admission: EM | Admit: 2017-10-25 | Discharge: 2017-10-25 | Disposition: A | Discharge status: Home / Self Care | Payer: 59 | Attending: Emergency Medicine | Admitting: Emergency Medicine

## 2017-10-25 ENCOUNTER — Emergency Department (HOSPITAL_COMMUNITY): Payer: 59

## 2017-10-25 ENCOUNTER — Encounter (HOSPITAL_COMMUNITY): Payer: Self-pay | Admitting: *Deleted

## 2017-10-25 DIAGNOSIS — R05 Cough: Secondary | ICD-10-CM | POA: Diagnosis not present

## 2017-10-25 DIAGNOSIS — J111 Influenza due to unidentified influenza virus with other respiratory manifestations: Secondary | ICD-10-CM

## 2017-10-25 DIAGNOSIS — F17210 Nicotine dependence, cigarettes, uncomplicated: Secondary | ICD-10-CM | POA: Diagnosis not present

## 2017-10-25 DIAGNOSIS — J029 Acute pharyngitis, unspecified: Secondary | ICD-10-CM | POA: Diagnosis not present

## 2017-10-25 DIAGNOSIS — R0789 Other chest pain: Secondary | ICD-10-CM | POA: Diagnosis not present

## 2017-10-25 DIAGNOSIS — R69 Illness, unspecified: Secondary | ICD-10-CM

## 2017-10-25 MED ORDER — IBUPROFEN 600 MG PO TABS: 600.0000 mg | ORAL_TABLET | Freq: Four times a day (QID) | ORAL | 0 refills | Status: DC | PRN

## 2017-10-25 MED ORDER — BENZONATATE 200 MG PO CAPS: 200.0000 mg | ORAL_CAPSULE | Freq: Three times a day (TID) | ORAL | 0 refills | Status: DC

## 2017-10-25 MED ORDER — KETOROLAC TROMETHAMINE 60 MG/2ML IM SOLN
60.0000 mg | Freq: Once | INTRAMUSCULAR | Status: AC
  Administered 2017-10-25: 60 mg via INTRAMUSCULAR
  Filled 2017-10-25: qty 2

## 2017-10-25 MED ORDER — METOCLOPRAMIDE HCL 5 MG/ML IJ SOLN
10.0000 mg | Freq: Once | INTRAMUSCULAR | Status: AC
  Administered 2017-10-25: 10 mg via INTRAMUSCULAR
  Filled 2017-10-25: qty 2

## 2017-10-25 MED ORDER — OSELTAMIVIR PHOSPHATE 75 MG PO CAPS: 75.0000 mg | ORAL_CAPSULE | Freq: Two times a day (BID) | ORAL | 0 refills | Status: DC

## 2017-10-25 MED ORDER — HYDROCOD POLST-CPM POLST ER 10-8 MG/5ML PO SUER
5.0000 mL | Freq: Once | ORAL | Status: AC
  Administered 2017-10-25: 5 mL via ORAL
  Filled 2017-10-25: qty 5

## 2017-10-25 NOTE — Discharge Instructions (Addendum)
Drink plenty of fluids.  Tylenol every 4 hours.  Follow-up with your doctor for recheck or return to the ER for any worsening symptoms.

## 2017-10-25 NOTE — ED Triage Notes (Signed)
Pt reports cough, sore throat, generalized body aches, headache, and chills since yesterday morning. Pt has not taken his temp at home and has taken Mucinex at home with minimal relief. Chest hurts when he coughs.
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
## 2017-10-26 MED FILL — OSELTAMIVIR PHOSPHATE 75 MG: 75 | 5 days supply | Qty: 10 | Fill #0

## 2017-10-26 MED FILL — ALLOPURINOL 300 MG TAB: 300 | 30 days supply | Qty: 30 | Fill #3

## 2017-10-26 MED FILL — BENZONATATE 200 MG CAPS: 200 | 7 days supply | Qty: 21 | Fill #0

## 2017-10-26 MED FILL — COLCHICINE 0.6 MG TABS: 0.6 | 5 days supply | Qty: 15 | Fill #2

## 2017-10-26 MED FILL — IBUPROFEN 600 MG TABLET: 600 | 5 days supply | Qty: 20 | Fill #0

## 2017-11-04 ENCOUNTER — Encounter: Payer: Self-pay | Admitting: Family Medicine

## 2017-11-04 ENCOUNTER — Ambulatory Visit (INDEPENDENT_AMBULATORY_CARE_PROVIDER_SITE_OTHER): Payer: 59 | Admitting: Family Medicine

## 2017-11-04 VITALS — BP 120/84 | Temp 98.9°F | Ht 72.0 in | Wt 236.0 lb

## 2017-11-04 DIAGNOSIS — J0110 Acute frontal sinusitis, unspecified: Secondary | ICD-10-CM

## 2017-11-04 DIAGNOSIS — M545 Low back pain: Secondary | ICD-10-CM

## 2017-11-04 DIAGNOSIS — G8929 Other chronic pain: Secondary | ICD-10-CM

## 2017-11-04 MED ORDER — AMOXICILLIN-POT CLAVULANATE 875-125 MG PO TABS: 1.0000 | ORAL_TABLET | Freq: Two times a day (BID) | ORAL | 0 refills | Status: DC

## 2017-11-04 MED ORDER — HYDROCODONE-ACETAMINOPHEN 7.5-325 MG PO TABS: 1.0000 | ORAL_TABLET | ORAL | 0 refills | Status: DC | PRN

## 2017-11-04 NOTE — Progress Notes (Signed)
   Subjective:    Patient ID: Marcus Hayes, male    DOB: [birth year], 57 y.o.   MRN: [id number]  HPI Patient is here today to follow up from hospitalization with the flu a week ago Sunday. States still has some head and chest congestion and productive cough. No fevers.Body aches are gone. Patient with significant congestion coughing not feeling good denies wheezing relates some chest congestion head congestion sinus pressure pain discomfort patient recently had the flu was in the hospital for a day  Patient does have chronic low back pain uses pain medicine intermittently or is requesting a refill drug registry was checked patient does state that the pain medicine does help him function denies abusing it  Review of Systems  Constitutional: Negative for activity change, chills and fever.  HENT: Positive for congestion and rhinorrhea. Negative for ear pain.   Eyes: Negative for discharge.  Respiratory: Positive for cough. Negative for wheezing.   Cardiovascular: Negative for chest pain.  Gastrointestinal: Negative for nausea and vomiting.  Musculoskeletal: Negative for arthralgias.       Objective:   Physical Exam  Constitutional: He appears well-developed.  HENT:  Head: Normocephalic and atraumatic.  Mouth/Throat: Oropharynx is clear and moist. No oropharyngeal exudate.  Eyes: Right eye exhibits no discharge. Left eye exhibits no discharge.  Neck: Normal range of motion.  Cardiovascular: Normal rate, regular rhythm and normal heart sounds.  No murmur heard. Pulmonary/Chest: Effort normal and breath sounds normal. No respiratory distress. He has no wheezes. He has no rales.  Lymphadenopathy:    He has no cervical adenopathy.  Neurological: He exhibits normal muscle tone.  Skin: Skin is warm and dry.  Nursing note and vitals reviewed.         Assessment & Plan:  Chronic low back pain-pain medicine does help intermittent for severe pain not for frequent use will need a  dedicated visit for pain in the future if ongoing troubles  Upper respiratory illness post flu sinusitis antibiotics prescribed warnings discussed

## 2017-11-09 MED FILL — HYDROCODON-APAP 7.5-325: 7.5-325 | 5 days supply | Qty: 28 | Fill #0

## 2017-11-11 MED FILL — PANTOPRAZOLE SOD DR 40 MG T: 40 | 30 days supply | Qty: 60 | Fill #0

## 2017-12-22 ENCOUNTER — Ambulatory Visit: Payer: 59 | Admitting: Gastroenterology

## 2017-12-22 ENCOUNTER — Encounter: Payer: Self-pay | Admitting: Gastroenterology

## 2018-01-13 ENCOUNTER — Encounter: Payer: 59 | Admitting: Family Medicine

## 2018-01-13 NOTE — Progress Notes (Signed)
   Subjective:    Patient ID: Marcus Hayes Dale Ledger, male    DOB: 07/02/1961, 57 y.o.   MRN: [id number]  HPI Patient no showed for physical   Review of Systems     Objective:   Physical Exam        Assessment & Plan:

## 2018-01-15 ENCOUNTER — Ambulatory Visit (INDEPENDENT_AMBULATORY_CARE_PROVIDER_SITE_OTHER): Payer: 59 | Admitting: Family Medicine

## 2018-01-15 ENCOUNTER — Encounter: Payer: Self-pay | Admitting: Family Medicine

## 2018-01-15 VITALS — BP 130/78 | Ht 72.0 in | Wt 237.2 lb

## 2018-01-15 DIAGNOSIS — Z0001 Encounter for general adult medical examination with abnormal findings: Secondary | ICD-10-CM

## 2018-01-15 DIAGNOSIS — G8929 Other chronic pain: Secondary | ICD-10-CM

## 2018-01-15 DIAGNOSIS — K219 Gastro-esophageal reflux disease without esophagitis: Secondary | ICD-10-CM

## 2018-01-15 DIAGNOSIS — M109 Gout, unspecified: Secondary | ICD-10-CM | POA: Diagnosis not present

## 2018-01-15 DIAGNOSIS — Z114 Encounter for screening for human immunodeficiency virus [HIV]: Secondary | ICD-10-CM | POA: Diagnosis not present

## 2018-01-15 DIAGNOSIS — E7849 Other hyperlipidemia: Secondary | ICD-10-CM | POA: Diagnosis not present

## 2018-01-15 DIAGNOSIS — M545 Low back pain: Secondary | ICD-10-CM

## 2018-01-15 DIAGNOSIS — Z1159 Encounter for screening for other viral diseases: Secondary | ICD-10-CM

## 2018-01-15 DIAGNOSIS — Z125 Encounter for screening for malignant neoplasm of prostate: Secondary | ICD-10-CM

## 2018-01-15 DIAGNOSIS — Z0000 Encounter for general adult medical examination without abnormal findings: Secondary | ICD-10-CM

## 2018-01-15 DIAGNOSIS — Z79899 Other long term (current) drug therapy: Secondary | ICD-10-CM

## 2018-01-15 MED ORDER — COLCHICINE 0.6 MG PO TABS: ORAL_TABLET | ORAL | 5 refills | Status: DC

## 2018-01-15 MED ORDER — ALLOPURINOL 300 MG PO TABS: ORAL_TABLET | ORAL | 1 refills | Status: DC

## 2018-01-15 MED ORDER — PANTOPRAZOLE SODIUM 40 MG PO TBEC: DELAYED_RELEASE_TABLET | ORAL | 1 refills | Status: DC

## 2018-01-15 MED ORDER — HYDROCODONE-ACETAMINOPHEN 7.5-325 MG PO TABS: 1.0000 | ORAL_TABLET | ORAL | 0 refills | Status: DC | PRN

## 2018-01-15 MED ORDER — ZOSTER VAC RECOMB ADJUVANTED 50 MCG/0.5ML IM SUSR: 0.5000 mL | Freq: Once | INTRAMUSCULAR | 1 refills | Status: AC

## 2018-01-15 MED FILL — COLCHICINE 0.6 MG TABS: 0.6 | 30 days supply | Qty: 60 | Fill #0

## 2018-01-15 MED FILL — PANTOPRAZOLE SOD DR 40 MG T: 40 | 90 days supply | Qty: 90 | Fill #0

## 2018-01-15 MED FILL — ALLOPURINOL 300 MG TABS: 300 | 90 days supply | Qty: 90 | Fill #0

## 2018-01-15 NOTE — Progress Notes (Signed)
Subjective:    Patient ID: Marcus Hayes, male    DOB: September 14, 1960, 57 y.o.   MRN: [id number]  HPI The patient comes in today for a wellness visit.  Patient denies any new complaints Does have reflux issues for which medication does help keep things under control  Does have chronic pain in his low back intermittently asked to take hydrocodone denies abusing it does help him with his function  Patient does have intermittent gout tries to watch his diet to the most part  Does suffer with moderate obesity he states he is trying to work on diet and weight and exercise some  Has hyperlipidemia knows to try to eat healthy but does not always do so  Does smoke intermittently knows he needs to quit  A review of their health history was completed.  A review of medications was also completed.  Any needed refills; yes; on all meds  Eating habits: health conscious   Falls/  MVA accidents in past few months: none  Regular exercise: walk and exercise  Specialist pt sees on regular basis: no  Preventative health issues were discussed.   Additional concerns: Pt requesting prescription for Shingles shot.    Review of Systems  Constitutional: Negative for activity change, appetite change and fever.  HENT: Negative for congestion and rhinorrhea.   Eyes: Negative for discharge.  Respiratory: Negative for cough and wheezing.   Cardiovascular: Negative for chest pain.  Gastrointestinal: Negative for abdominal pain, blood in stool and vomiting.  Genitourinary: Negative for difficulty urinating and frequency.  Musculoskeletal: Negative for neck pain.  Skin: Negative for rash.  Allergic/Immunologic: Negative for environmental allergies and food allergies.  Neurological: Negative for weakness and headaches.  Psychiatric/Behavioral: Negative for agitation.       Objective:   Physical Exam  Constitutional: He appears well-developed and well-nourished.  HENT:  Head: Normocephalic  and atraumatic.  Right Ear: External ear normal.  Left Ear: External ear normal.  Nose: Nose normal.  Mouth/Throat: Oropharynx is clear and moist.  Eyes: Pupils are equal, round, and reactive to light. EOM are normal.  Neck: Normal range of motion. Neck supple. No thyromegaly present.  Cardiovascular: Normal rate, regular rhythm and normal heart sounds.  No murmur heard. Pulmonary/Chest: Effort normal and breath sounds normal. No respiratory distress. He has no wheezes.  Abdominal: Soft. Bowel sounds are normal. He exhibits no distension and no mass. There is no tenderness.  Genitourinary: Penis normal.  Musculoskeletal: Normal range of motion. He exhibits no edema.  Lymphadenopathy:    He has no cervical adenopathy.  Neurological: He is alert. He exhibits normal muscle tone.  Skin: Skin is warm and dry. No erythema.  Psychiatric: He has a normal mood and affect. His behavior is normal. Judgment normal.          Assessment & Plan:  Adult wellness-complete.wellness physical was conducted today. Importance of diet and exercise were discussed in detail.  In addition to this a discussion regarding safety was also covered. We also reviewed over immunizations and gave recommendations regarding current immunization needed for age.  In addition to this additional areas were also touched on including: Preventative health exams needed:  Colonoscopy 2029  Patient was advised yearly wellness exam  Gout-intermittent flareups patient would prefer to be back on allopurinol he will start allopurinol 1 daily colchicine 1 daily for the first 2 weeks then after that stop colchicine  May use colchicine twice daily as needed for any flareups  GERD decent  control no dysphagia continue medication  Chronic low back pain without sciatica sometimes severe patient responsible drug registry checked hydrocodone for infrequent use limited number given-patient understands that with this medication have to  see every 3 months with prescriptions  Patient was counseled to quit smoking

## 2018-01-18 MED FILL — HYDROCODON-APAP 7.5-325: 7.5-325 | 4 days supply | Qty: 28 | Fill #0

## 2018-01-21 DIAGNOSIS — Z114 Encounter for screening for human immunodeficiency virus [HIV]: Secondary | ICD-10-CM | POA: Diagnosis not present

## 2018-01-21 DIAGNOSIS — Z0000 Encounter for general adult medical examination without abnormal findings: Secondary | ICD-10-CM | POA: Diagnosis not present

## 2018-01-21 DIAGNOSIS — Z1159 Encounter for screening for other viral diseases: Secondary | ICD-10-CM | POA: Diagnosis not present

## 2018-01-21 DIAGNOSIS — Z79899 Other long term (current) drug therapy: Secondary | ICD-10-CM | POA: Diagnosis not present

## 2018-01-21 DIAGNOSIS — E7849 Other hyperlipidemia: Secondary | ICD-10-CM | POA: Diagnosis not present

## 2018-01-21 DIAGNOSIS — M109 Gout, unspecified: Secondary | ICD-10-CM | POA: Diagnosis not present

## 2018-01-21 DIAGNOSIS — Z125 Encounter for screening for malignant neoplasm of prostate: Secondary | ICD-10-CM | POA: Diagnosis not present

## 2018-01-22 LAB — LIPID PANEL
Chol/HDL Ratio: 4.2 ratio (ref 0.0–5.0)
Cholesterol, Total: 234 mg/dL — ABNORMAL HIGH (ref 100–199)
HDL: 56 mg/dL (ref >39)
LDL CALC: 144 mg/dL — AB (ref 0–99)
Triglycerides: 172 mg/dL — ABNORMAL HIGH (ref 0–149)
VLDL Cholesterol Cal: 34 mg/dL (ref 5–40)

## 2018-01-22 LAB — HEPATITIS C ANTIBODY

## 2018-01-22 LAB — HEPATIC FUNCTION PANEL
ALBUMIN: 4 g/dL (ref 3.5–5.5)
ALT: 15 IU/L (ref 0–44)
AST: 13 IU/L (ref 0–40)
Alkaline Phosphatase: 101 IU/L (ref 39–117)
BILIRUBIN TOTAL: 0.3 mg/dL (ref 0.0–1.2)
Bilirubin, Direct: 0.09 mg/dL (ref 0.00–0.40)
TOTAL PROTEIN: 6.4 g/dL (ref 6.0–8.5)

## 2018-01-22 LAB — BASIC METABOLIC PANEL
BUN/Creatinine Ratio: 12 (ref 9–20)
BUN: 13 mg/dL (ref 6–24)
CALCIUM: 9.6 mg/dL (ref 8.7–10.2)
CHLORIDE: 103 mmol/L (ref 96–106)
CO2: 24 mmol/L (ref 20–29)
CREATININE: 1.07 mg/dL (ref 0.76–1.27)
GFR calc non Af Amer: 78 mL/min/{1.73_m2} (ref >59)
GFR, EST AFRICAN AMERICAN: 90 mL/min/{1.73_m2} (ref >59)
Glucose: 135 mg/dL — ABNORMAL HIGH (ref 65–99)
Potassium: 4.7 mmol/L (ref 3.5–5.2)
Sodium: 143 mmol/L (ref 134–144)

## 2018-01-22 LAB — PSA: Prostate Specific Ag, Serum: 0.6 ng/mL (ref 0.0–4.0)

## 2018-01-22 LAB — HIV ANTIBODY (ROUTINE TESTING W REFLEX): HIV SCREEN 4TH GENERATION: NONREACTIVE

## 2018-01-22 LAB — URIC ACID: Uric Acid: 8.3 mg/dL (ref 3.7–8.6)

## 2018-01-27 ENCOUNTER — Ambulatory Visit: Payer: 59 | Admitting: Family Medicine

## 2018-01-27 ENCOUNTER — Encounter: Payer: Self-pay | Admitting: Family Medicine

## 2018-01-27 VITALS — BP 140/94 | Temp 97.7°F | Ht 72.0 in | Wt 241.0 lb

## 2018-01-27 DIAGNOSIS — L0291 Cutaneous abscess, unspecified: Secondary | ICD-10-CM

## 2018-01-27 DIAGNOSIS — L03818 Cellulitis of other sites: Secondary | ICD-10-CM | POA: Diagnosis not present

## 2018-01-27 MED ORDER — DOXYCYCLINE HYCLATE 100 MG PO TABS: 100.0000 mg | ORAL_TABLET | Freq: Two times a day (BID) | ORAL | 0 refills | Status: DC

## 2018-01-27 NOTE — Progress Notes (Signed)
   Subjective:    Patient ID: Marcus Hayes, male    DOB: 26-Nov-1960, 57 y.o.   MRN: [id number]  HPI  Patient is here today with complaints of knots under both arm pits. He noticed them a week now ago.They hurt to touch, burn a little.He has not put any medication on it.  Cellulitis pain discomfort under both arms but worse on the left arm with a large swollen area tender aches he [place] [employer] and sleeping [place] and showering there and relates that recently they discovered that there was E. coli he thinks that this is causing some of his problems Review of Systems Denies high fevers chills sweats headaches body aches chest discomfort    Objective:   Physical Exam Lungs clear respiratory rate normal heart regular no murmurs moderate amount of cellulitis with abscess underneath the left arm along with several areas of folliculitis healing areas under the right arm are noted  Patient gave consent With 1% lidocaine with epinephrine injected 11.  Blade was used- cut underneath the arm was approximately 1 cm Moderate amount of pus obtained Packing was placed Sterile technique was used Culture sent       Assessment & Plan:  Cellulitis Warm compresses Abscess Doxycycline twice daily 10 days Culture sent Warning signs discussed Follow-up here for recheck tomorrow

## 2018-01-28 ENCOUNTER — Other Ambulatory Visit: Payer: Self-pay | Admitting: *Deleted

## 2018-01-28 ENCOUNTER — Ambulatory Visit (INDEPENDENT_AMBULATORY_CARE_PROVIDER_SITE_OTHER): Payer: 59 | Admitting: Family Medicine

## 2018-01-28 VITALS — BP 132/90 | Temp 98.5°F | Ht 72.0 in | Wt 241.0 lb

## 2018-01-28 DIAGNOSIS — E7849 Other hyperlipidemia: Secondary | ICD-10-CM

## 2018-01-28 DIAGNOSIS — Z79899 Other long term (current) drug therapy: Secondary | ICD-10-CM

## 2018-01-28 DIAGNOSIS — L0291 Cutaneous abscess, unspecified: Secondary | ICD-10-CM

## 2018-01-28 DIAGNOSIS — R739 Hyperglycemia, unspecified: Secondary | ICD-10-CM

## 2018-01-28 MED ORDER — HYDROCODONE-ACETAMINOPHEN 7.5-325 MG PO TABS: 1.0000 | ORAL_TABLET | ORAL | 0 refills | Status: DC | PRN

## 2018-01-28 NOTE — Progress Notes (Signed)
   Subjective:    Patient ID: Marcus Hayes, male    DOB: 04/20/1961, 57 y.o.   MRN: [id number]  HPIRecheck abscess under left arm.  The packing was placed yesterday relates pain and soreness discomfort but not as bad as yesterday denies high fever chills sweats nausea vomiting diarrhea   Review of Systems    Please see above tolerating antibiotics well Objective:   Physical Exam Cellulitis is less packing was removed minimal discharge appears to be improving has several other nodules under the arm these are abscesses it can be drained at this time       Assessment & Plan:  Warm compresses, continue antibiotics, follow-up if problems, warning signs discussed

## 2018-01-29 MED FILL — HYDROCODON-APAP 7.5-325: 7.5-325 | 5 days supply | Qty: 28 | Fill #0

## 2018-01-31 LAB — WOUND CULTURE

## 2018-01-31 LAB — SPECIMEN STATUS REPORT

## 2018-02-11 ENCOUNTER — Other Ambulatory Visit: Payer: Self-pay | Admitting: *Deleted

## 2018-02-11 ENCOUNTER — Telehealth: Payer: Self-pay | Admitting: Family Medicine

## 2018-02-11 MED ORDER — ATORVASTATIN CALCIUM 20 MG PO TABS: 20.0000 mg | ORAL_TABLET | Freq: Every day | ORAL | 1 refills | Status: DC

## 2018-02-11 MED FILL — ATORVASTATIN CALCIUM 20 MG: 20 | 90 days supply | Qty: 90 | Fill #0

## 2018-02-11 NOTE — Telephone Encounter (Signed)
Patients spouse said that Dr. Lorin PicketScott was supposed to call in Rx for cholesterol medication.  Pharmacy doesn't have anything.  Please advise.   [facility]

## 2018-02-11 NOTE — Telephone Encounter (Signed)
Med sent to pharm. Pt notified.  

## 2018-02-11 NOTE — Telephone Encounter (Signed)
On patient's lipid panel it mentions starting daily Lipitor but does not state the recommended dose and no med has been sent in

## 2018-02-11 NOTE — Telephone Encounter (Signed)
lipitor 20 six mo worth,f u as rec in initial message

## 2018-02-25 ENCOUNTER — Encounter: Payer: Self-pay | Admitting: Family Medicine

## 2018-03-01 ENCOUNTER — Ambulatory Visit (INDEPENDENT_AMBULATORY_CARE_PROVIDER_SITE_OTHER): Payer: 59 | Admitting: Family Medicine

## 2018-03-01 VITALS — Ht 72.0 in | Wt 239.4 lb

## 2018-03-01 DIAGNOSIS — R0789 Other chest pain: Secondary | ICD-10-CM | POA: Diagnosis not present

## 2018-03-01 DIAGNOSIS — E7849 Other hyperlipidemia: Secondary | ICD-10-CM | POA: Diagnosis not present

## 2018-03-01 DIAGNOSIS — G8929 Other chronic pain: Secondary | ICD-10-CM

## 2018-03-01 DIAGNOSIS — K2100 Gastro-esophageal reflux disease with esophagitis, without bleeding: Secondary | ICD-10-CM

## 2018-03-01 DIAGNOSIS — M545 Low back pain, unspecified: Secondary | ICD-10-CM

## 2018-03-01 DIAGNOSIS — E785 Hyperlipidemia, unspecified: Secondary | ICD-10-CM | POA: Insufficient documentation

## 2018-03-01 MED ORDER — DOXYCYCLINE HYCLATE 100 MG PO TABS: 100.0000 mg | ORAL_TABLET | Freq: Two times a day (BID) | ORAL | 0 refills | Status: DC

## 2018-03-01 MED ORDER — HYDROCODONE-ACETAMINOPHEN 7.5-325 MG PO TABS: ORAL_TABLET | ORAL | 0 refills | Status: DC

## 2018-03-01 MED ORDER — PANTOPRAZOLE SODIUM 40 MG PO TBEC: DELAYED_RELEASE_TABLET | ORAL | 1 refills | Status: DC

## 2018-03-01 MED FILL — DOXYCYCLINE HYCLATE 100 MG: 100 | 21 days supply | Qty: 42 | Fill #0

## 2018-03-01 MED FILL — HYDROCODON-APAP 7.5-325: 7.5-325 | 30 days supply | Qty: 60 | Fill #0

## 2018-03-01 MED FILL — PANTOPRAZOLE SOD DR 40 MG T: 40 | 90 days supply | Qty: 180 | Fill #0

## 2018-03-01 NOTE — Progress Notes (Signed)
   Subjective:    Patient ID: Marcus Hayes, male    DOB: July 26, 1961, 57 y.o.   MRN: [id number]  HPI Patient arrives with continued knot under left arm pit. Patient states it was lanced and doing better on antibiotic but coming back since finishing antibiotic One area had to be lanced and drained.  Recently was seems to come back He denies any other type of underlying issue Denies high fever chills sweats   Patient relates chronic low back pain discomfort uses hydrocodone sometimes once or twice daily to help with the pain regular OTC measures do not help enough denies any leg weakness with all of this  He does have a history of esophagitis as well as gastritis has had a EGD earlier this year now having some intermittent dysphagia also having some intermittent burning in her throat denies fever chills sweats denies any chest pressure with walking but he does have significant risk factors for heart disease  .Review of Systems Does have some intermittent heartburn issues and some slight dysphagia issues denies true blockages.  Is seen gastroenterology Denies any chest pressure tightness pain wheezing difficulty breathing swelling in the legs does relate tenderness on the left arm    Objective:   Physical Exam Lungs are clear respiratory rate is normal heart is regular no murmurs abdomen is soft no guarding rebound or tenderness HEENT is benign no neck masses are felt no trachea deviation in addition to this has a couple bumps underneath the left arm does not appear to be an abscess at this point appears to be more folliculitis   This EKG was compared to the most recent previous EKG available in the electronic records.  (Please see previous EKGs most recent EKG under the EKG heading.) There is no acute changes compared to previous EKG    Assessment & Plan:  Left underarm folliculitis with localized infection doxycycline twice daily for the next 2 to 3 weeks warm compresses if it develops  into an abscess needs I&D to follow-up  Dysphagia related to esophagitis increase PPI to twice daily we will connect with gastroenterology to see if they want to see the patient back for follow-up  Intermittent esophageal discomfort EKG does not show any acute changes I do not feel the patient has any type of blockages going on he is at high risk for heart disease very important to get cholesterol under control he will take his medicine and do follow-up lab work in 3 to 4 months also encouraged him to quit smoking  For chronic low back pain uses hydrocodone approximately 2/day prescription for 60 was given he will let us know when he needs his next one if it appears that he will need this on a regular basis we will have to set him up for pain management visits on a regular basis  25 minutes was spent with the patient.  This statement verifies that 25 minutes was indeed spent with the patient.  More than 50% of this visit-total duration of the visit-was spent in counseling and coordination of care. The issues that the patient came in for today as reflected in the diagnosis (s) please refer to documentation for further details.

## 2018-03-02 ENCOUNTER — Other Ambulatory Visit: Payer: Self-pay | Admitting: Family Medicine

## 2018-03-02 ENCOUNTER — Encounter: Payer: Self-pay | Admitting: Gastroenterology

## 2018-03-02 DIAGNOSIS — K2100 Gastro-esophageal reflux disease with esophagitis, without bleeding: Secondary | ICD-10-CM

## 2018-03-02 DIAGNOSIS — R1310 Dysphagia, unspecified: Secondary | ICD-10-CM

## 2018-03-02 NOTE — Progress Notes (Signed)
Referral placed.

## 2018-03-03 ENCOUNTER — Encounter: Payer: Self-pay | Admitting: Family Medicine

## 2018-04-07 ENCOUNTER — Other Ambulatory Visit: Payer: Self-pay | Admitting: Family Medicine

## 2018-04-07 ENCOUNTER — Telehealth: Payer: Self-pay | Admitting: *Deleted

## 2018-04-07 MED ORDER — HYDROCODONE-ACETAMINOPHEN 7.5-325 MG PO TABS: ORAL_TABLET | ORAL | 0 refills | Status: DC

## 2018-04-07 NOTE — Telephone Encounter (Signed)
This medication was approved Was sent into [name] It is important for the patient to be aware that his next visit we will be doing a pain management contract which is necessary by state law This visit needs to be in October and at that time we can provide 3 prescriptions for his pain medicine This is necessary if he desires to receive ongoing pain medicine This is state law

## 2018-04-07 NOTE — Telephone Encounter (Signed)
Patient requests a refill of his hydrocodone

## 2018-04-08 MED FILL — HYDROCODON-APAP 7.5-325: 7.5-325 | 30 days supply | Qty: 60 | Fill #0

## 2018-04-08 NOTE — Telephone Encounter (Signed)
Patient advised This medication was approved Was sent into [name] It is important for the patient to be aware that his next visit we will be doing a pain management contract which is necessary by state law This visit needs to be in October and at that time we can provide 3 prescriptions for his pain medicine This is necessary if he desires to receive ongoing pain medicine This is state law Patient verbalized understanding and stated he will call back to schedule a follow up office visit.

## 2018-04-19 ENCOUNTER — Other Ambulatory Visit: Payer: Self-pay | Admitting: Family Medicine

## 2018-04-19 NOTE — Telephone Encounter (Signed)
The patient needs to call regarding this medicine

## 2018-05-05 ENCOUNTER — Encounter: Payer: Self-pay | Admitting: Family Medicine

## 2018-05-05 ENCOUNTER — Ambulatory Visit: Payer: 59 | Admitting: Family Medicine

## 2018-05-05 VITALS — BP 128/82 | Temp 98.2°F | Ht 72.0 in | Wt 244.6 lb

## 2018-05-05 DIAGNOSIS — R21 Rash and other nonspecific skin eruption: Secondary | ICD-10-CM | POA: Diagnosis not present

## 2018-05-05 DIAGNOSIS — M545 Low back pain: Secondary | ICD-10-CM

## 2018-05-05 DIAGNOSIS — G8929 Other chronic pain: Secondary | ICD-10-CM

## 2018-05-05 MED ORDER — HYDROXYZINE HCL 25 MG PO TABS: ORAL_TABLET | ORAL | 3 refills | Status: DC

## 2018-05-05 MED ORDER — PREDNISONE 20 MG PO TABS: ORAL_TABLET | ORAL | 0 refills | Status: DC

## 2018-05-05 MED ORDER — METHYLPREDNISOLONE ACETATE 40 MG/ML IJ SUSP
40.0000 mg | Freq: Once | INTRAMUSCULAR | Status: AC
Start: 2018-05-05 — End: 2018-05-05
  Administered 2018-05-05: 40 mg via INTRAMUSCULAR

## 2018-05-05 MED FILL — predniSONE 20 MG TABS: 20 | 9 days supply | Qty: 18 | Fill #0

## 2018-05-05 MED FILL — hydrOXYzine HCL 25 MG TABS: 25 | 7 days supply | Qty: 30 | Fill #0

## 2018-05-05 NOTE — Progress Notes (Signed)
   Subjective:    Patient ID: Marcus Hayes, male    DOB: 05-08-1961, 57 y.o.   MRN: [id number]  Rash  This is a new problem. The current episode started in the past 7 days. The affected locations include the torso, left arm, left upper leg, left lower leg, right arm, right elbow and right hand. The rash is characterized by itchiness. Pertinent negatives include no congestion, cough, diarrhea, rhinorrhea, shortness of breath or vomiting. Past treatments include antihistamine. The treatment provided no relief.  Patient thinks he has poison oak But he relates multiple bumps on the abdomen back legs arms states he had been outside in the woods but not deer stands denies any other particular trouble  States he is running low on his pain medicine is scheduled for a follow-up in a few days wonders if we can do all of the checkup today he relates compliance with his medicine    Review of Systems  Constitutional: Negative for activity change.  HENT: Negative for congestion and rhinorrhea.   Respiratory: Negative for cough and shortness of breath.   Cardiovascular: Negative for chest pain.  Gastrointestinal: Negative for abdominal pain, diarrhea, nausea and vomiting.  Genitourinary: Negative for dysuria and hematuria.  Musculoskeletal: Positive for arthralgias and back pain.  Skin: Positive for rash.  Neurological: Negative for weakness and headaches.  Psychiatric/Behavioral: Negative for behavioral problems and confusion.       Objective:   Physical Exam  Constitutional: He appears well-nourished. No distress.  HENT:  Head: Normocephalic and atraumatic.  Eyes: Right eye exhibits no discharge. Left eye exhibits no discharge.  Neck: No tracheal deviation present.  Cardiovascular: Normal rate, regular rhythm and normal heart sounds.  No murmur heard. Pulmonary/Chest: Effort normal and breath sounds normal. No respiratory distress.  Musculoskeletal: He exhibits no edema.    Lymphadenopathy:    He has no cervical adenopathy.  Neurological: He is alert. Coordination normal.  Skin: Skin is warm and dry.  Psychiatric: He has a normal mood and affect. His behavior is normal.  Vitals reviewed.         Assessment & Plan:  Chronic pain We will send in one prescription on his pain medicine he will need to follow-up by the end of October for regular checkup and his pain medicines  His rash has the appearance of straw mites-I recommend Depo-Medrol shot plus prednisone taper and hydroxyzine for itching caution drowsiness follow-up if ongoing troubles

## 2018-05-09 MED ORDER — HYDROCODONE-ACETAMINOPHEN 7.5-325 MG PO TABS: ORAL_TABLET | ORAL | 0 refills | Status: DC

## 2018-05-10 ENCOUNTER — Ambulatory Visit: Payer: 59 | Admitting: Family Medicine

## 2018-05-10 MED FILL — HYDROCODON-APAP 7.5-325: 7.5-325 | 30 days supply | Qty: 60 | Fill #0

## 2018-05-31 MED FILL — hydrOXYzine HCL 25 MG TABS: 25 | 7 days supply | Qty: 30 | Fill #1

## 2018-05-31 MED FILL — COLCHICINE 0.6 MG TABS: 0.6 | 30 days supply | Qty: 60 | Fill #1

## 2018-05-31 MED FILL — ALLOPURINOL 300 MG TAB: 300 | 90 days supply | Qty: 90 | Fill #1

## 2018-05-31 MED FILL — PANTOPRAZOLE SOD DR 40 MG T: 40 | 90 days supply | Qty: 180 | Fill #1

## 2018-06-02 ENCOUNTER — Ambulatory Visit: Payer: 59 | Admitting: Family Medicine

## 2018-06-08 ENCOUNTER — Encounter: Payer: Self-pay | Admitting: Family Medicine

## 2018-06-08 ENCOUNTER — Ambulatory Visit: Payer: 59 | Admitting: Family Medicine

## 2018-06-08 VITALS — BP 140/88 | Ht 72.0 in | Wt 247.2 lb

## 2018-06-08 DIAGNOSIS — M109 Gout, unspecified: Secondary | ICD-10-CM | POA: Diagnosis not present

## 2018-06-08 DIAGNOSIS — E7849 Other hyperlipidemia: Secondary | ICD-10-CM | POA: Diagnosis not present

## 2018-06-08 DIAGNOSIS — G8929 Other chronic pain: Secondary | ICD-10-CM | POA: Diagnosis not present

## 2018-06-08 DIAGNOSIS — M545 Low back pain, unspecified: Secondary | ICD-10-CM

## 2018-06-08 DIAGNOSIS — R739 Hyperglycemia, unspecified: Secondary | ICD-10-CM | POA: Diagnosis not present

## 2018-06-08 DIAGNOSIS — Z79899 Other long term (current) drug therapy: Secondary | ICD-10-CM

## 2018-06-08 MED ORDER — ATORVASTATIN CALCIUM 20 MG PO TABS: 20.0000 mg | ORAL_TABLET | Freq: Every day | ORAL | 1 refills | Status: DC

## 2018-06-08 MED ORDER — ALLOPURINOL 300 MG PO TABS: ORAL_TABLET | ORAL | 1 refills | Status: DC

## 2018-06-08 MED ORDER — HYDROCODONE-ACETAMINOPHEN 7.5-325 MG PO TABS: ORAL_TABLET | ORAL | 0 refills | Status: DC

## 2018-06-08 MED ORDER — PANTOPRAZOLE SODIUM 40 MG PO TBEC: DELAYED_RELEASE_TABLET | ORAL | 1 refills | Status: DC

## 2018-06-08 MED FILL — ATORVASTATIN CALCIUM 20 MG: 20 | 90 days supply | Qty: 90 | Fill #0

## 2018-06-08 NOTE — Patient Instructions (Signed)

## 2018-06-08 NOTE — Progress Notes (Signed)
   Subjective:    Patient ID: Marcus Hayes, male    DOB: Aug 25, 1960, 57 y.o.   MRN: [id number]  HPI This patient was seen today for chronic pain  The medication list was reviewed and updated.   -Compliance with medication: yes  - Number patient states they take daily: 2  -when was the last dose patient took? This morning  The patient was advised the importance of maintaining medication and not using illegal substances with these.  Here for refills and follow up  The patient was educated that we can provide 3 monthly scripts for their medication, it is their responsibility to follow the instructions.  Side effects or complications from medications: none  Patient is aware that pain medications are meant to minimize the severity of the pain to allow their pain levels to improve to allow for better function. They are aware of that pain medications cannot totally remove their pain.  Due for UDT ( at least once per year) :        Review of Systems  Constitutional: Negative for diaphoresis and fatigue.  HENT: Negative for congestion and rhinorrhea.   Respiratory: Negative for cough and shortness of breath.   Cardiovascular: Negative for chest pain and leg swelling.  Gastrointestinal: Negative for abdominal pain and diarrhea.  Skin: Negative for color change and rash.  Neurological: Negative for dizziness and headaches.  Psychiatric/Behavioral: Negative for behavioral problems and confusion.       Objective:   Physical Exam  Constitutional: He appears well-nourished. No distress.  HENT:  Head: Normocephalic and atraumatic.  Eyes: Right eye exhibits no discharge. Left eye exhibits no discharge.  Neck: No tracheal deviation present.  Cardiovascular: Normal rate, regular rhythm and normal heart sounds.  No murmur heard. Pulmonary/Chest: Effort normal and breath sounds normal. No respiratory distress.  Musculoskeletal: He exhibits no edema.  Lymphadenopathy:    He has  no cervical adenopathy.  Neurological: He is alert. Coordination normal.  Skin: Skin is warm and dry.  Psychiatric: He has a normal mood and affect. His behavior is normal.  Vitals reviewed.         Assessment & Plan:  The patient was seen today as part of an evaluation regarding hyperlipidemia.  Recent lab work has been reviewed with the patient as well as the goals for good cholesterol care.  In addition to this medications have been discussed the importance of compliance with diet and medications discussed as well.  Finally the patient is aware that poor control of cholesterol, noncompliance can dramatically increase the risk of complications. The patient will keep regular office visits and the patient does agreed to periodic lab work.  The patient was seen in followup for chronic pain. A review over at their current pain status was discussed. Drug registry was checked. Prescriptions were given. Discussion was held regarding the importance of compliance with medication as well as pain medication contract.  Time for questions regarding pain management plan occurred. Importance of regular followup visits was discussed. Patient was informed that medication may cause drowsiness and should not be combined  with other medications/alcohol or street drugs. Patient was cautioned that medication could cause drowsiness. If the patient feels medication is causing altered alertness then do not drive or operate dangerous equipment.  Drug registry was checked 3 prescriptions sent  History of elevated glucose minimize starches in diet check lab work  Gout continue allopurinol for prevention check lab work  Follow-up 3 months

## 2018-06-09 ENCOUNTER — Ambulatory Visit: Payer: 59 | Admitting: Gastroenterology

## 2018-06-09 MED FILL — HYDROCODON-APAP 7.5-325: 7.5-325 | 30 days supply | Qty: 60 | Fill #0

## 2018-07-09 MED FILL — HYDROCODON-APAP 7.5-325: 7.5-325 | 30 days supply | Qty: 60 | Fill #0

## 2018-07-30 ENCOUNTER — Encounter: Payer: Self-pay | Admitting: Family Medicine

## 2018-07-30 ENCOUNTER — Ambulatory Visit: Payer: 59 | Admitting: Family Medicine

## 2018-07-30 VITALS — BP 122/86 | Temp 98.6°F | Wt 245.6 lb

## 2018-07-30 DIAGNOSIS — B029 Zoster without complications: Secondary | ICD-10-CM

## 2018-07-30 MED ORDER — OXYCODONE-ACETAMINOPHEN 5-325 MG PO TABS: 1.0000 | ORAL_TABLET | ORAL | 0 refills | Status: DC | PRN

## 2018-07-30 MED ORDER — OXYCODONE-ACETAMINOPHEN 5-325 MG PO TABS: 1.0000 | ORAL_TABLET | ORAL | 0 refills | Status: AC | PRN

## 2018-07-30 MED ORDER — VALACYCLOVIR HCL 1 G PO TABS: 1000.0000 mg | ORAL_TABLET | Freq: Three times a day (TID) | ORAL | 0 refills | Status: DC

## 2018-07-30 NOTE — Progress Notes (Signed)
   Subjective:    Patient ID: Marcus Hayes, male    DOB: 04-Dec-1960, 57 y.o.   MRN: [id number]  HPI Pt here today due to his head feeling blistered and having a headache. Has been going on since Christmas. Has been using Excedrin. Mom did have shingles in head a while back. Wife has also had shingles.  Patient relates burning on top of his head Denies fever chills sweats Relates energy level subpar at times This is been over the past several days Denies any facial burning or blistering  Review of Systems    Denies any other trouble such as high fever chills sweats wheezing difficulty breathing nausea vomiting diarrhea joint pains body pains Objective:   Physical Exam  Possible shingles attack poss possible shingles attack Lungs are clear respiratory rate normal heart regular no murmurs pulses normal.  I inspected the scalp I did not find any evidence of any blistering but he describes it as feeling very sore to the touch      Assessment & Plan:  ///Possible shingles attack Valtrex as directed Oxycodone as needed for pain caution drowsiness do not take with hydrocodone  Give us feedback next week on how things are going If anything changes to notify us Blood pressure good I do not feel this is a headache related to blood pressure

## 2018-08-31 ENCOUNTER — Ambulatory Visit: Payer: 59 | Admitting: Family Medicine

## 2018-09-06 ENCOUNTER — Ambulatory Visit: Payer: 59 | Admitting: Gastroenterology

## 2018-09-06 ENCOUNTER — Encounter: Payer: Self-pay | Admitting: Gastroenterology

## 2018-09-15 DIAGNOSIS — M545 Low back pain: Secondary | ICD-10-CM | POA: Diagnosis not present

## 2018-09-15 DIAGNOSIS — G8929 Other chronic pain: Secondary | ICD-10-CM | POA: Diagnosis not present

## 2018-09-15 DIAGNOSIS — R739 Hyperglycemia, unspecified: Secondary | ICD-10-CM | POA: Diagnosis not present

## 2018-09-15 DIAGNOSIS — E7849 Other hyperlipidemia: Secondary | ICD-10-CM | POA: Diagnosis not present

## 2018-09-15 DIAGNOSIS — M109 Gout, unspecified: Secondary | ICD-10-CM | POA: Diagnosis not present

## 2018-09-16 LAB — BASIC METABOLIC PANEL
BUN/Creatinine Ratio: 11 (ref 9–20)
BUN: 12 mg/dL (ref 6–24)
CO2: 23 mmol/L (ref 20–29)
CREATININE: 1.11 mg/dL (ref 0.76–1.27)
Calcium: 9.6 mg/dL (ref 8.7–10.2)
Chloride: 105 mmol/L (ref 96–106)
GFR calc Af Amer: 85 mL/min/{1.73_m2} (ref >59)
GFR, EST NON AFRICAN AMERICAN: 74 mL/min/{1.73_m2} (ref >59)
GLUCOSE: 96 mg/dL (ref 65–99)
Potassium: 5.4 mmol/L — ABNORMAL HIGH (ref 3.5–5.2)
Sodium: 147 mmol/L — ABNORMAL HIGH (ref 134–144)

## 2018-09-16 LAB — LIPID PANEL
CHOL/HDL RATIO: 4.2 ratio (ref 0.0–5.0)
Cholesterol, Total: 217 mg/dL — ABNORMAL HIGH (ref 100–199)
HDL: 52 mg/dL (ref >39)
LDL Calculated: 142 mg/dL — ABNORMAL HIGH (ref 0–99)
TRIGLYCERIDES: 117 mg/dL (ref 0–149)
VLDL CHOLESTEROL CAL: 23 mg/dL (ref 5–40)

## 2018-09-16 LAB — HEMOGLOBIN A1C
Est. average glucose Bld gHb Est-mCnc: 123 mg/dL
Hgb A1c MFr Bld: 5.9 % — ABNORMAL HIGH (ref 4.8–5.6)

## 2018-09-16 LAB — URIC ACID: URIC ACID: 6.8 mg/dL (ref 3.7–8.6)

## 2018-09-17 ENCOUNTER — Other Ambulatory Visit: Payer: Self-pay | Admitting: Family Medicine

## 2018-09-17 ENCOUNTER — Telehealth: Payer: Self-pay | Admitting: Family Medicine

## 2018-09-17 MED ORDER — HYDROCODONE-ACETAMINOPHEN 7.5-325 MG PO TABS: ORAL_TABLET | ORAL | 0 refills | Status: DC

## 2018-09-17 MED FILL — HYDROCODON-APAP 7.5-325: 7.5-325 | 15 days supply | Qty: 30 | Fill #0

## 2018-09-17 NOTE — Telephone Encounter (Signed)
Patient is requesting refill on hydrocodone 7.5/325 and wanting to pick up today if possible.

## 2018-09-17 NOTE — Telephone Encounter (Signed)
Pt has result note also; left message to return call.

## 2018-09-17 NOTE — Telephone Encounter (Signed)
1.  This patient is on chronic pain medicines 2.  Chronic pain medicines require a visit every 3 months 3.  In November we approved 3 prescriptions and we set him up for a office visit in late January for his next pain medication visit 4.  Patient missed that appointment as in no show I will send in 2-week prescription we need to schedule the patient for a pain management visit within the next 2 weeks

## 2018-09-17 NOTE — Telephone Encounter (Signed)
Please advise. Thank you

## 2018-09-20 NOTE — Telephone Encounter (Signed)
Discussed with pt. Pt verbalized understanding. He states he will give [place] a call back to schedule because he is driving right now.

## 2018-09-21 MED ORDER — ATORVASTATIN CALCIUM 40 MG PO TABS: 40.0000 mg | ORAL_TABLET | Freq: Every day | ORAL | 1 refills | Status: DC

## 2018-09-21 MED FILL — ATORVASTATIN 40 MG TABLET: 40 | 90 days supply | Qty: 90 | Fill #0

## 2018-09-21 NOTE — Addendum Note (Signed)
Addended by: Margaretha Sheffield on: 09/21/2018 09:16 AM   Modules accepted: Orders

## 2018-09-24 ENCOUNTER — Other Ambulatory Visit: Payer: Self-pay | Admitting: Family Medicine

## 2018-09-24 ENCOUNTER — Other Ambulatory Visit: Payer: Self-pay

## 2018-09-24 DIAGNOSIS — E7849 Other hyperlipidemia: Secondary | ICD-10-CM

## 2018-09-24 DIAGNOSIS — Z79899 Other long term (current) drug therapy: Secondary | ICD-10-CM

## 2018-09-24 MED ORDER — ATORVASTATIN CALCIUM 20 MG PO TABS: ORAL_TABLET | ORAL | 1 refills | Status: DC

## 2018-09-24 MED FILL — ATORVASTATIN 20 MG TABLET: 20 | 90 days supply | Qty: 90 | Fill #0

## 2018-09-28 NOTE — Addendum Note (Signed)
Addended by: Meredith Leeds on: 09/28/2018 09:07 AM   Modules accepted: Orders

## 2018-09-28 NOTE — Addendum Note (Signed)
Addended by: Meredith Leeds on: 09/28/2018 09:05 AM   Modules accepted: Orders

## 2018-10-21 ENCOUNTER — Encounter: Payer: Self-pay | Admitting: Family Medicine

## 2018-10-21 ENCOUNTER — Other Ambulatory Visit: Payer: Self-pay

## 2018-10-21 ENCOUNTER — Ambulatory Visit: Payer: 59 | Admitting: Family Medicine

## 2018-10-21 ENCOUNTER — Other Ambulatory Visit: Payer: Self-pay | Admitting: Family Medicine

## 2018-10-21 VITALS — BP 138/88 | Ht 72.0 in | Wt 258.4 lb

## 2018-10-21 DIAGNOSIS — M109 Gout, unspecified: Secondary | ICD-10-CM | POA: Diagnosis not present

## 2018-10-21 DIAGNOSIS — M545 Low back pain: Secondary | ICD-10-CM

## 2018-10-21 DIAGNOSIS — G8929 Other chronic pain: Secondary | ICD-10-CM | POA: Diagnosis not present

## 2018-10-21 DIAGNOSIS — Z79891 Long term (current) use of opiate analgesic: Secondary | ICD-10-CM

## 2018-10-21 DIAGNOSIS — Z0000 Encounter for general adult medical examination without abnormal findings: Secondary | ICD-10-CM | POA: Diagnosis not present

## 2018-10-21 DIAGNOSIS — E7849 Other hyperlipidemia: Secondary | ICD-10-CM

## 2018-10-21 DIAGNOSIS — M5440 Lumbago with sciatica, unspecified side: Secondary | ICD-10-CM

## 2018-10-21 MED ORDER — HYDROCODONE-ACETAMINOPHEN 7.5-325 MG PO TABS: ORAL_TABLET | ORAL | 0 refills | Status: DC

## 2018-10-21 MED FILL — HYDROCODON-APAP 7.5-325: 7.5-325 | 30 days supply | Qty: 60 | Fill #0

## 2018-10-21 MED FILL — ALLOPURINOL 300 MG TABS: 300 | 90 days supply | Qty: 90 | Fill #0

## 2018-10-21 NOTE — Progress Notes (Signed)
Subjective:    Patient ID: Marcus Hayes, male    DOB: June 03, 1961, 58 y.o.   MRN: [id number]  HPI The patient comes in today for a wellness visit.  Patient here for follow-up regarding cholesterol.  The patient does have hyperlipidemia.  Patient does try to maintain a reasonable diet.  Patient does take the medication on a regular basis.  Denies missing a dose.  The patient denies any obvious side effects.  Prior blood work results reviewed with the patient.  The patient is aware of his cholesterol goals and the need to keep it under good control to lessen the risk of disease.   A review of their health history was completed.  A review of medications was also completed.  Any needed refills; does need refills  Eating habits: Tries eat somewhat healthy  Falls/  MVA accidents in past few months: No recent falls or injury  Regular exercise: Does a lot of work outside does do a fair amount of walking with his job  Specialist pt sees on regular basis: No specialist  Preventative health issues were discussed.  Consent  Additional concerns: Pain medication refills needed  Patient does have ongoing trouble with reflux.  Takes medication on a regular basis.  Tries to minimize foods as best they can.  They understand the importance of dietary compliance.  May also try to avoid eating a large meal close to bedtime.  Patient denies any dysphagia denies hematochezia.  States medicine does a good job keeping the problem under good control.  Without the medication may certainly have issues.They desire to continue taking their medication.   This patient was seen today for chronic pain  The medication list was reviewed and updated. Takes for back pain   -Compliance with medication: takes one to two a day  - Number patient states they take daily: sometimes two a day  -when was the last dose patient took?  Ran out several days ago  The patient was advised the importance of maintaining  medication and not using illegal substances with these.  Here for refills and follow up  The patient was educated that we can provide 3 monthly scripts for their medication, it is their responsibility to follow the instructions.  Side effects or complications from medications: none  Patient is aware that pain medications are meant to minimize the severity of the pain to allow their pain levels to improve to allow for better function. They are aware of that pain medications cannot totally remove their pain.  Due for UDT ( at least once per year) : due today   Pt wants meds changed to what his wife takes because sometimes they take each others meds when they run out. She takes oxycodone 10/325      Review of Systems  Constitutional: Negative for activity change, appetite change and fatigue.  HENT: Negative for congestion and rhinorrhea.   Respiratory: Negative for cough and shortness of breath.   Cardiovascular: Negative for chest pain and leg swelling.  Gastrointestinal: Negative for abdominal pain, nausea and vomiting.  Neurological: Negative for dizziness and headaches.  Psychiatric/Behavioral: Negative for agitation and behavioral problems.       Objective:   Physical Exam Vitals signs reviewed.  Constitutional:      General: He is not in acute distress. HENT:     Head: Normocephalic and atraumatic.  Eyes:     General:        Right eye: No discharge.  Left eye: No discharge.  Neck:     Trachea: No tracheal deviation.  Cardiovascular:     Rate and Rhythm: Normal rate and regular rhythm.     Heart sounds: Normal heart sounds. No murmur.  Pulmonary:     Effort: Pulmonary effort is normal. No respiratory distress.     Breath sounds: Normal breath sounds.  Lymphadenopathy:     Cervical: No cervical adenopathy.  Skin:    General: Skin is warm and dry.  Neurological:     Mental Status: He is alert.     Coordination: Coordination normal.  Psychiatric:         Behavior: Behavior normal.     Prostate normal     Assessment & Plan:  Adult wellness-complete.wellness physical was conducted today. Importance of diet and exercise were discussed in detail.  In addition to this a discussion regarding safety was also covered. We also reviewed over immunizations and gave recommendations regarding current immunization needed for age.  In addition to this additional areas were also touched on including: Preventative health exams needed:  Colonoscopy last year up-to-date  Patient was advised yearly wellness exam  The patient was seen in followup for chronic pain. A review over at their current pain status was discussed. Drug registry was checked. Prescriptions were given. Discussion was held regarding the importance of compliance with medication as well as pain medication contract.  Time for questions regarding pain management plan occurred. Importance of regular followup visits was discussed. Patient was informed that medication may cause drowsiness and should not be combined  with other medications/alcohol or street drugs. Patient was cautioned that medication could cause drowsiness. If the patient feels medication is causing altered alertness then do not drive or operate dangerous equipment.  The patient was seen today as part of an evaluation regarding hyperlipidemia.  Recent lab work has been reviewed with the patient as well as the goals for good cholesterol care.  In addition to this medications have been discussed the importance of compliance with diet and medications discussed as well.  Finally the patient is aware that poor control of cholesterol, noncompliance can dramatically increase the risk of complications. The patient will keep regular office visits and the patient does agreed to periodic lab work.  The patient was seen today for GERD. Patient benefits from medication. Patient to continue medication. Keep all regular follow ups.

## 2018-10-21 NOTE — Patient Instructions (Signed)
Steps to Quit Smoking  Smoking tobacco can be bad for your health. It can also affect almost every organ in your body. Smoking puts you and people around you at risk for many serious long-lasting (chronic) diseases. Quitting smoking is hard, but it is one of the best things that you can do for your health. It is never too late to quit. What are the benefits of quitting smoking? When you quit smoking, you lower your risk for getting serious diseases and conditions. They can include:  Lung cancer or lung disease.  Heart disease.  Stroke.  Heart attack.  Not being able to have children (infertility).  Weak bones (osteoporosis) and broken bones (fractures). If you have coughing, wheezing, and shortness of breath, those symptoms may get better when you quit. You may also get sick less often. If you are pregnant, quitting smoking can help to lower your chances of having a baby of low birth weight. What can I do to help me quit smoking? Talk with your doctor about what can help you quit smoking. Some things you can do (strategies) include:  Quitting smoking totally, instead of slowly cutting back how much you smoke over a period of time.  Going to in-person counseling. You are more likely to quit if you go to many counseling sessions.  Using resources and support systems, such as: ? Online chats with a counselor. ? Phone quitlines. ? Printed self-help materials. ? Support groups or group counseling. ? Text messaging programs. ? Mobile phone apps or applications.  Taking medicines. Some of these medicines may have nicotine in them. If you are pregnant or breastfeeding, do not take any medicines to quit smoking unless your doctor says it is okay. Talk with your doctor about counseling or other things that can help you. Talk with your doctor about using more than one strategy at the same time, such as taking medicines while you are also going to in-person counseling. This can help make  quitting easier. What things can I do to make it easier to quit? Quitting smoking might feel very hard at first, but there is a lot that you can do to make it easier. Take these steps:  Talk to your family and friends. Ask them to support and encourage you.  Call phone quitlines, reach out to support groups, or work with a counselor.  Ask people who smoke to not smoke around you.  Avoid places that make you want (trigger) to smoke, such as: ? Bars. ? Parties. ? Smoke-break areas at work.  Spend time with people who do not smoke.  Lower the stress in your life. Stress can make you want to smoke. Try these things to help your stress: ? Getting regular exercise. ? Deep-breathing exercises. ? Yoga. ? Meditating. ? Doing a body scan. To do this, close your eyes, focus on one area of your body at a time from head to toe, and notice which parts of your body are tense. Try to relax the muscles in those areas.  Download or buy apps on your mobile phone or tablet that can help you stick to your quit plan. There are many free apps, such as QuitGuide from the CDC (Centers for Disease Control and Prevention). You can find more support from smokefree.gov and other websites. This information is not intended to replace advice given to you by your health care provider. Make sure you discuss any questions you have with your health care provider. Document Released: 05/17/2009 Document Revised: 03/18/2016   Document Reviewed: 12/05/2014 Elsevier Interactive Patient Education  2019 [employer]  DASH Eating Plan DASH stands for "Dietary Approaches to Stop Hypertension." The DASH eating plan is a healthy eating plan that has been shown to reduce high blood pressure (hypertension). It may also reduce your risk for type 2 diabetes, heart disease, and stroke. The DASH eating plan may also help with weight loss. What are tips for following this plan?  General guidelines  Avoid eating more than 2,300 mg  (milligrams) of salt (sodium) a day. If you have hypertension, you may need to reduce your sodium intake to 1,500 mg a day.  Limit alcohol intake to no more than 1 drink a day for nonpregnant women and 2 drinks a day for men. One drink equals 12 oz of beer, 5 oz of wine, or 1 oz of hard liquor.  Work with your health care provider to maintain a healthy body weight or to lose weight. Ask what an ideal weight is for you.  Get at least 30 minutes of exercise that causes your heart to beat faster (aerobic exercise) most days of the week. Activities may include walking, swimming, or biking.  Work with your health care provider or diet and nutrition specialist (dietitian) to adjust your eating plan to your individual calorie needs. Reading food labels   Check food labels for the amount of sodium per serving. Choose foods with less than 5 percent of the Daily Value of sodium. Generally, foods with less than 300 mg of sodium per serving fit into this eating plan.  To find whole grains, look for the word "whole" as the first word in the ingredient list. Shopping  Buy products labeled as "low-sodium" or "no salt added."  Buy fresh foods. Avoid canned foods and premade or frozen meals. Cooking  Avoid adding salt when cooking. Use salt-free seasonings or herbs instead of table salt or sea salt. Check with your health care provider or pharmacist before using salt substitutes.  Do not fry foods. Cook foods using healthy methods such as baking, boiling, grilling, and broiling instead.  Cook with heart-healthy oils, such as olive, canola, soybean, or sunflower oil. Meal planning  Eat a balanced diet that includes: ? 5 or more servings of fruits and vegetables each day. At each meal, try to fill half of your plate with fruits and vegetables. ? Up to 6-8 servings of whole grains each day. ? Less than 6 oz of lean meat, poultry, or fish each day. A 3-oz serving of meat is about the same size as a deck  of cards. One egg equals 1 oz. ? 2 servings of low-fat dairy each day. ? A serving of nuts, seeds, or beans 5 times each week. ? Heart-healthy fats. Healthy fats called Omega-3 fatty acids are found in foods such as flaxseeds and coldwater fish, like sardines, salmon, and mackerel.  Limit how much you eat of the following: ? Canned or prepackaged foods. ? Food that is high in trans fat, such as fried foods. ? Food that is high in saturated fat, such as fatty meat. ? Sweets, desserts, sugary drinks, and other foods with added sugar. ? Full-fat dairy products.  Do not salt foods before eating.  Try to eat at least 2 vegetarian meals each week.  Eat more home-cooked food and less restaurant, buffet, and fast food.  When eating at a restaurant, ask that your food be prepared with less salt or no salt, if possible. What foods are recommended? The   items listed may not be a complete list. Talk with your dietitian about what dietary choices are best for you. Grains Whole-grain or whole-wheat bread. Whole-grain or whole-wheat pasta. Brown rice. Oatmeal. Quinoa. Bulgur. Whole-grain and low-sodium cereals. Pita bread. Low-fat, low-sodium crackers. Whole-wheat flour tortillas. Vegetables Fresh or frozen vegetables (raw, steamed, roasted, or grilled). Low-sodium or reduced-sodium tomato and vegetable juice. Low-sodium or reduced-sodium tomato sauce and tomato paste. Low-sodium or reduced-sodium canned vegetables. Fruits All fresh, dried, or frozen fruit. Canned fruit in natural juice (without added sugar). Meat and other protein foods Skinless chicken or turkey. Ground chicken or turkey. Pork with fat trimmed off. Fish and seafood. Egg whites. Dried beans, peas, or lentils. Unsalted nuts, nut butters, and seeds. Unsalted canned beans. Lean cuts of beef with fat trimmed off. Low-sodium, lean deli meat. Dairy Low-fat (1%) or fat-free (skim) milk. Fat-free, low-fat, or reduced-fat cheeses. Nonfat,  low-sodium ricotta or cottage cheese. Low-fat or nonfat yogurt. Low-fat, low-sodium cheese. Fats and oils Soft margarine without trans fats. Vegetable oil. Low-fat, reduced-fat, or light mayonnaise and salad dressings (reduced-sodium). Canola, safflower, olive, soybean, and sunflower oils. Avocado. Seasoning and other foods Herbs. Spices. Seasoning mixes without salt. Unsalted popcorn and pretzels. Fat-free sweets. What foods are not recommended? The items listed may not be a complete list. Talk with your dietitian about what dietary choices are best for you. Grains Baked goods made with fat, such as croissants, muffins, or some breads. Dry pasta or rice meal packs. Vegetables Creamed or fried vegetables. Vegetables in a cheese sauce. Regular canned vegetables (not low-sodium or reduced-sodium). Regular canned tomato sauce and paste (not low-sodium or reduced-sodium). Regular tomato and vegetable juice (not low-sodium or reduced-sodium). Pickles. Olives. Fruits Canned fruit in a light or heavy syrup. Fried fruit. Fruit in cream or butter sauce. Meat and other protein foods Fatty cuts of meat. Ribs. Fried meat. Bacon. Sausage. Bologna and other processed lunch meats. Salami. Fatback. Hotdogs. Bratwurst. Salted nuts and seeds. Canned beans with added salt. Canned or smoked fish. Whole eggs or egg yolks. Chicken or turkey with skin. Dairy Whole or 2% milk, cream, and half-and-half. Whole or full-fat cream cheese. Whole-fat or sweetened yogurt. Full-fat cheese. Nondairy creamers. Whipped toppings. Processed cheese and cheese spreads. Fats and oils Butter. Stick margarine. Lard. Shortening. Ghee. Bacon fat. Tropical oils, such as coconut, palm kernel, or palm oil. Seasoning and other foods Salted popcorn and pretzels. Onion salt, garlic salt, seasoned salt, table salt, and sea salt. Worcestershire sauce. Tartar sauce. Barbecue sauce. Teriyaki sauce. Soy sauce, including reduced-sodium. Steak sauce.  Canned and packaged gravies. Fish sauce. Oyster sauce. Cocktail sauce. Horseradish that you find on the shelf. Ketchup. Mustard. Meat flavorings and tenderizers. Bouillon cubes. Hot sauce and Tabasco sauce. Premade or packaged marinades. Premade or packaged taco seasonings. Relishes. Regular salad dressings. Where to find more information:  National Heart, Lung, and Blood Institute: www.nhlbi.nih.gov  American Heart Association: www.heart.org Summary  The DASH eating plan is a healthy eating plan that has been shown to reduce high blood pressure (hypertension). It may also reduce your risk for type 2 diabetes, heart disease, and stroke.  With the DASH eating plan, you should limit salt (sodium) intake to 2,300 mg a day. If you have hypertension, you may need to reduce your sodium intake to 1,500 mg a day.  When on the DASH eating plan, aim to eat more fresh fruits and vegetables, whole grains, lean proteins, low-fat dairy, and heart-healthy fats.  Work with your health care provider   or diet and nutrition specialist (dietitian) to adjust your eating plan to your individual calorie needs. This information is not intended to replace advice given to you by your health care provider. Make sure you discuss any questions you have with your health care provider. Document Released: 07/10/2011 Document Revised: 07/14/2016 Document Reviewed: 07/14/2016 Elsevier Interactive Patient Education  2019 Elsevier Inc.  

## 2018-10-26 ENCOUNTER — Encounter: Payer: Self-pay | Admitting: Family Medicine

## 2018-10-26 LAB — TOXASSURE SELECT 13 (MW), URINE

## 2018-11-25 MED FILL — HYDROCODON-APAP 7.5-325: 7.5-325 | 30 days supply | Qty: 90 | Fill #0

## 2018-12-29 MED FILL — HYDROCODON-APAP 7.5-325: 7.5-325 | 30 days supply | Qty: 90 | Fill #0

## 2018-12-29 MED FILL — PANTOPRAZOLE SOD DR 40 MG T: 40 | 90 days supply | Qty: 180 | Fill #0

## 2018-12-29 MED FILL — ATORVASTATIN 20 MG TABLET: 20 | 90 days supply | Qty: 90 | Fill #1

## 2019-01-31 MED FILL — ALLOPURINOL 300 MG TAB: 300 | 90 days supply | Qty: 90 | Fill #1

## 2019-01-31 MED FILL — HYDROCODON-APAP 7.5-325: 7.5-325 | 30 days supply | Qty: 90 | Fill #0

## 2019-03-04 ENCOUNTER — Other Ambulatory Visit: Payer: Self-pay | Admitting: Family Medicine

## 2019-03-04 MED FILL — HYDROCODON-APAP 7.5-325: 7.5-325 | 30 days supply | Qty: 90 | Fill #0

## 2019-03-04 NOTE — Telephone Encounter (Signed)
Patient has appointment 8/11 but might have to change due to worl schedule not out yet.

## 2019-03-04 NOTE — Telephone Encounter (Signed)
The patient must schedule a office visit within the next 30 days then please let me know and we can do the refill

## 2019-03-15 ENCOUNTER — Ambulatory Visit (INDEPENDENT_AMBULATORY_CARE_PROVIDER_SITE_OTHER): Payer: 59 | Admitting: Family Medicine

## 2019-03-15 ENCOUNTER — Other Ambulatory Visit: Payer: Self-pay

## 2019-03-15 DIAGNOSIS — Z125 Encounter for screening for malignant neoplasm of prostate: Secondary | ICD-10-CM | POA: Diagnosis not present

## 2019-03-15 DIAGNOSIS — E7849 Other hyperlipidemia: Secondary | ICD-10-CM | POA: Diagnosis not present

## 2019-03-15 DIAGNOSIS — G8929 Other chronic pain: Secondary | ICD-10-CM

## 2019-03-15 DIAGNOSIS — Z79899 Other long term (current) drug therapy: Secondary | ICD-10-CM

## 2019-03-15 DIAGNOSIS — M109 Gout, unspecified: Secondary | ICD-10-CM

## 2019-03-15 DIAGNOSIS — M5440 Lumbago with sciatica, unspecified side: Secondary | ICD-10-CM

## 2019-03-15 MED ORDER — HYDROCODONE-ACETAMINOPHEN 7.5-325 MG PO TABS: ORAL_TABLET | ORAL | 0 refills | Status: DC

## 2019-03-15 MED FILL — HYDROCODON-APAP 7.5-325: 7.5-325 | 30 days supply | Qty: 90 | Fill #0

## 2019-03-15 NOTE — Progress Notes (Signed)
Subjective:    Patient ID: Marcus Hayes, male    DOB: [birth year], 58 y.o.   MRN: [id number]  HPI  This patient was seen today for chronic pain  The medication list was reviewed and updated.   -Compliance with medication: hydrocodone   - Number patient states they take daily: 2-3 tabs a day  -when was the last dose patient took? today  The patient was advised the importance of maintaining medication and not using illegal substances with these.  Here for refills and follow up  The patient was educated that we can provide 3 monthly scripts for their medication, it is their responsibility to follow the instructions.  Side effects or complications from medications: none Patient relates that the pain medicine does help him with his pain allows him to function denies abusing it denies being addicted pain registry was checked Patient is aware that pain medications are meant to minimize the severity of the pain to allow their pain levels to improve to allow for better function. They are aware of that pain medications cannot totally remove their pain.  Virtual Visit via Video Note  I connected with Marcus Hayes on 03/15/19 at 11:00 AM EDT by a video enabled telemedicine application and verified that I am speaking with the correct person using two identifiers.  Location: Patient: home Provider: office   I discussed the limitations of evaluation and management by telemedicine and the availability of in person appointments. The patient expressed understanding and agreed to proceed.  History of Present Illness:    Observations/Objective:   Assessment and Plan:   Follow Up Instructions:    I discussed the assessment and treatment plan with the patient. The patient was provided an opportunity to ask questions and all were answered. The patient agreed with the plan and demonstrated an understanding of the instructions.   The patient was advised to call back or seek an  in-person evaluation if the symptoms worsen or if the condition fails to improve as anticipated.  I provided 17 minutes of non-face-to-face time during this encounter.         Review of Systems  Constitutional: Negative for activity change.  HENT: Negative for congestion and rhinorrhea.   Respiratory: Negative for cough and shortness of breath.   Cardiovascular: Negative for chest pain.  Gastrointestinal: Negative for abdominal pain, diarrhea, nausea and vomiting.  Genitourinary: Negative for dysuria and hematuria.  Neurological: Negative for weakness and headaches.  Psychiatric/Behavioral: Negative for behavioral problems and confusion.       Objective:   Physical Exam  Today's visit was via telephone Physical exam was not possible for this visit       Assessment & Plan:  The patient was seen in followup for chronic pain. A review over at their current pain status was discussed. Drug registry was checked. Prescriptions were given. Discussion was held regarding the importance of compliance with medication as well as pain medication contract.  Time for questions regarding pain management plan occurred. Importance of regular followup visits was discussed. Patient was informed that medication may cause drowsiness and should not be combined  with other medications/alcohol or street drugs. Patient was cautioned that medication could cause drowsiness. If the patient feels medication is causing altered alertness then do not drive or operate dangerous equipment.  Pain medicine does benefit this patient he does not abuse it.  He will continue using the medication  Patient will follow-up 3 months for wellness comprehensive visit as well as  pain management visit

## 2019-04-15 MED FILL — HYDROCODON-APAP 7.5-325: 7.5-325 | 30 days supply | Qty: 90 | Fill #0

## 2019-05-05 ENCOUNTER — Other Ambulatory Visit: Payer: Self-pay | Admitting: Family Medicine

## 2019-05-05 MED FILL — PANTOPRAZOLE SOD DR 40 MG T: 40 | 90 days supply | Qty: 180 | Fill #1

## 2019-05-05 MED FILL — ATORVASTATIN 20 MG TABLET: 20 | 90 days supply | Qty: 90 | Fill #0

## 2019-05-17 MED FILL — HYDROCODON-APAP 7.5-325: 7.5-325 | 30 days supply | Qty: 90 | Fill #0

## 2019-06-06 MED FILL — ALLOPURINOL 300 MG TABS: 300 | 90 days supply | Qty: 90 | Fill #0

## 2019-06-16 IMAGING — DX DG ELBOW COMPLETE 3+V*L*
4 series · 4 of 4 positions shown · non-contrast
Comparison: None.

CLINICAL DATA: LEFT elbow pain for few days.

EXAM:
LEFT ELBOW - COMPLETE 3+ VIEW

[elbow ap]
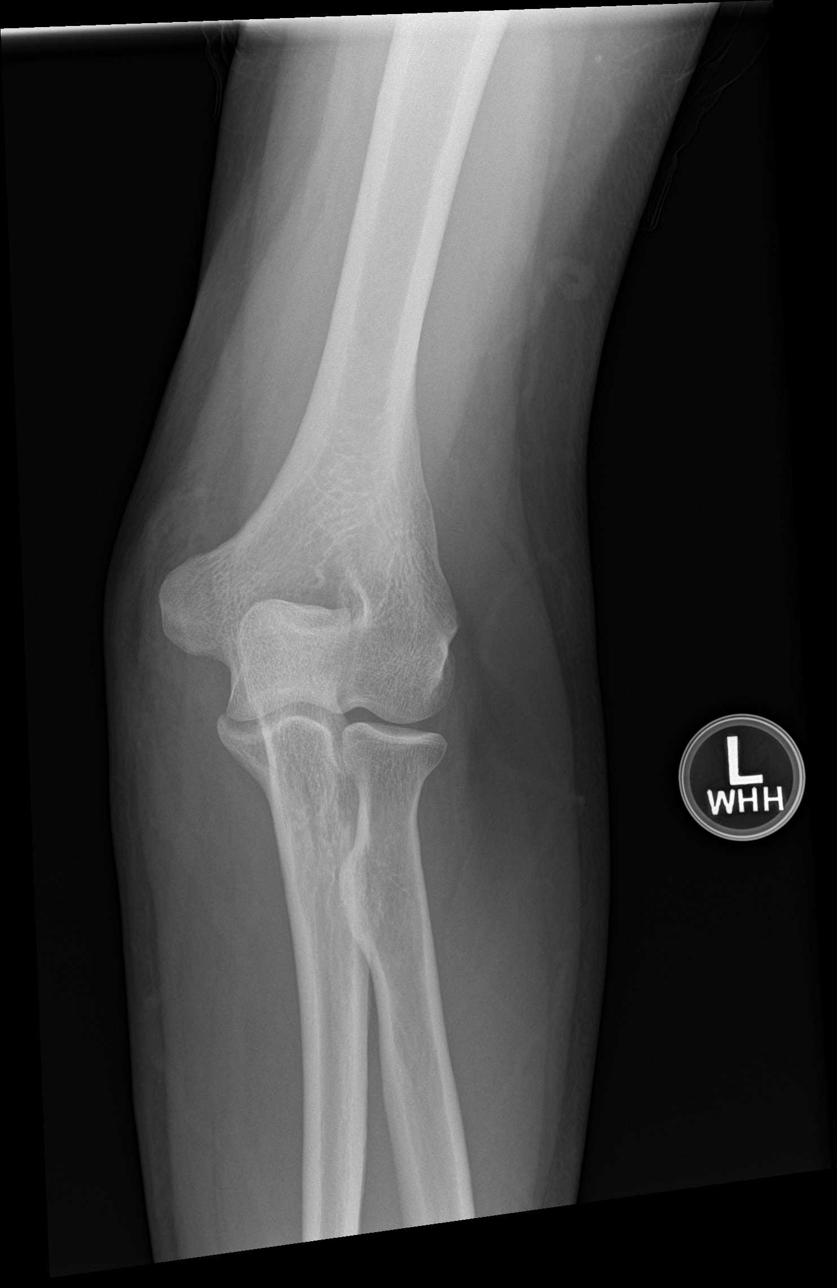

[elbow obl (1 of 2)]
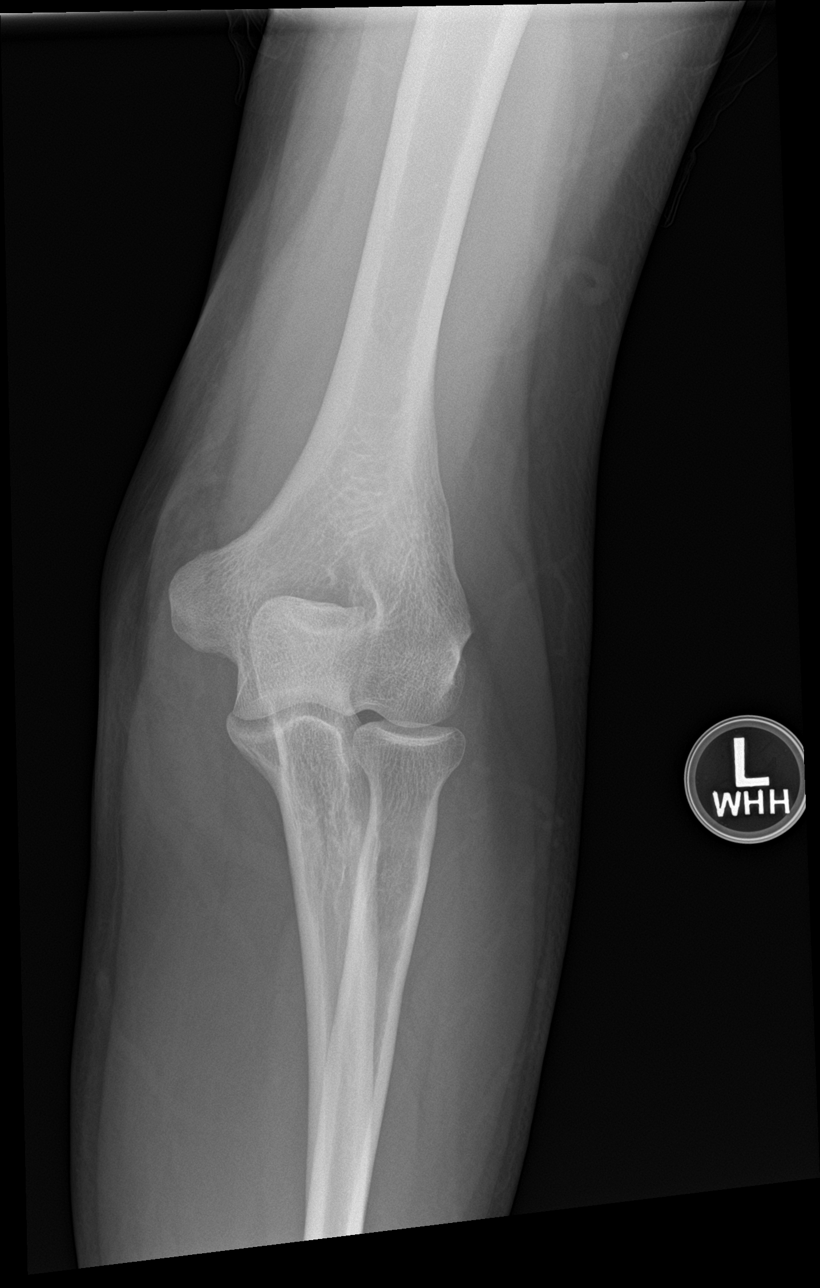

[elbow lat]
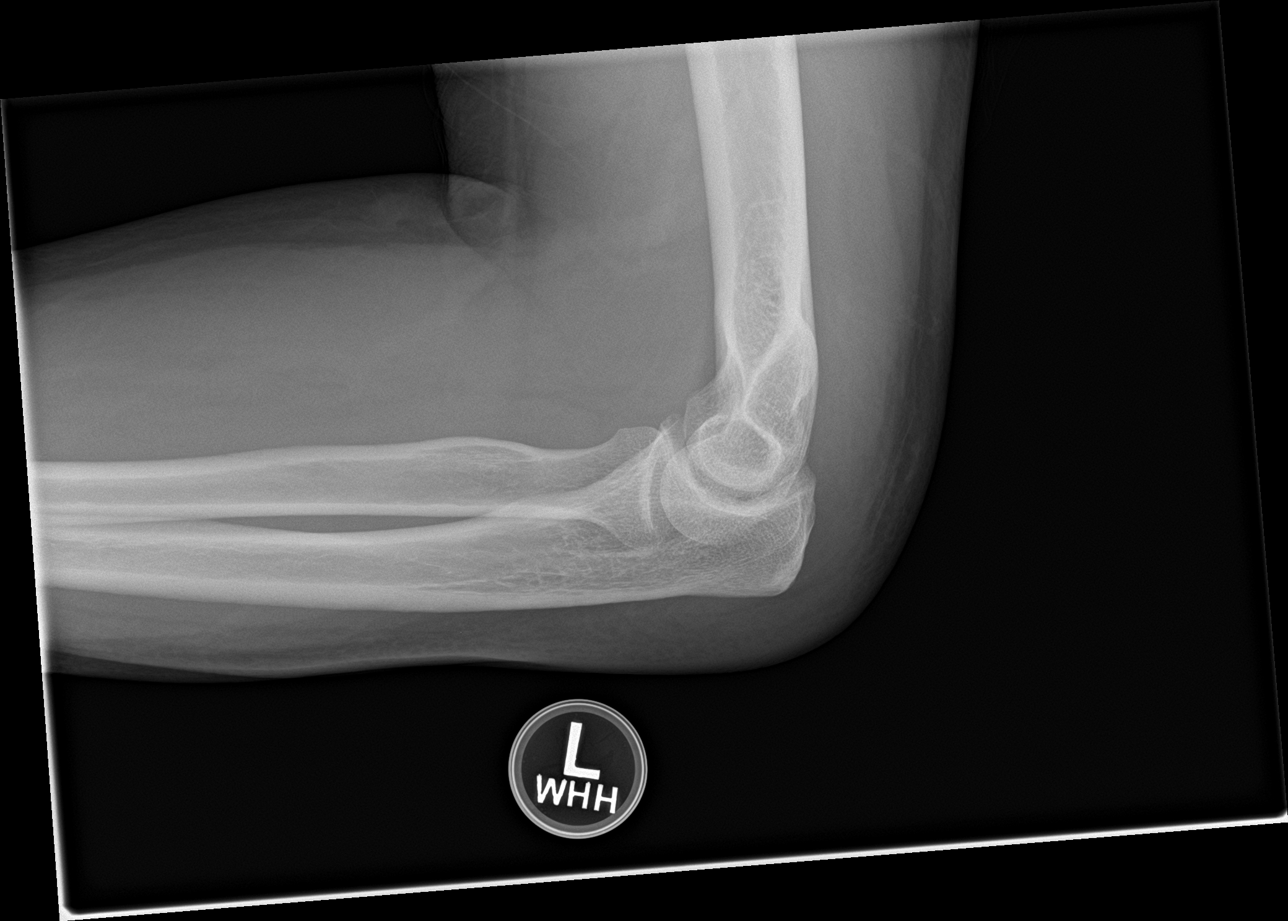

[elbow obl (2 of 2)]
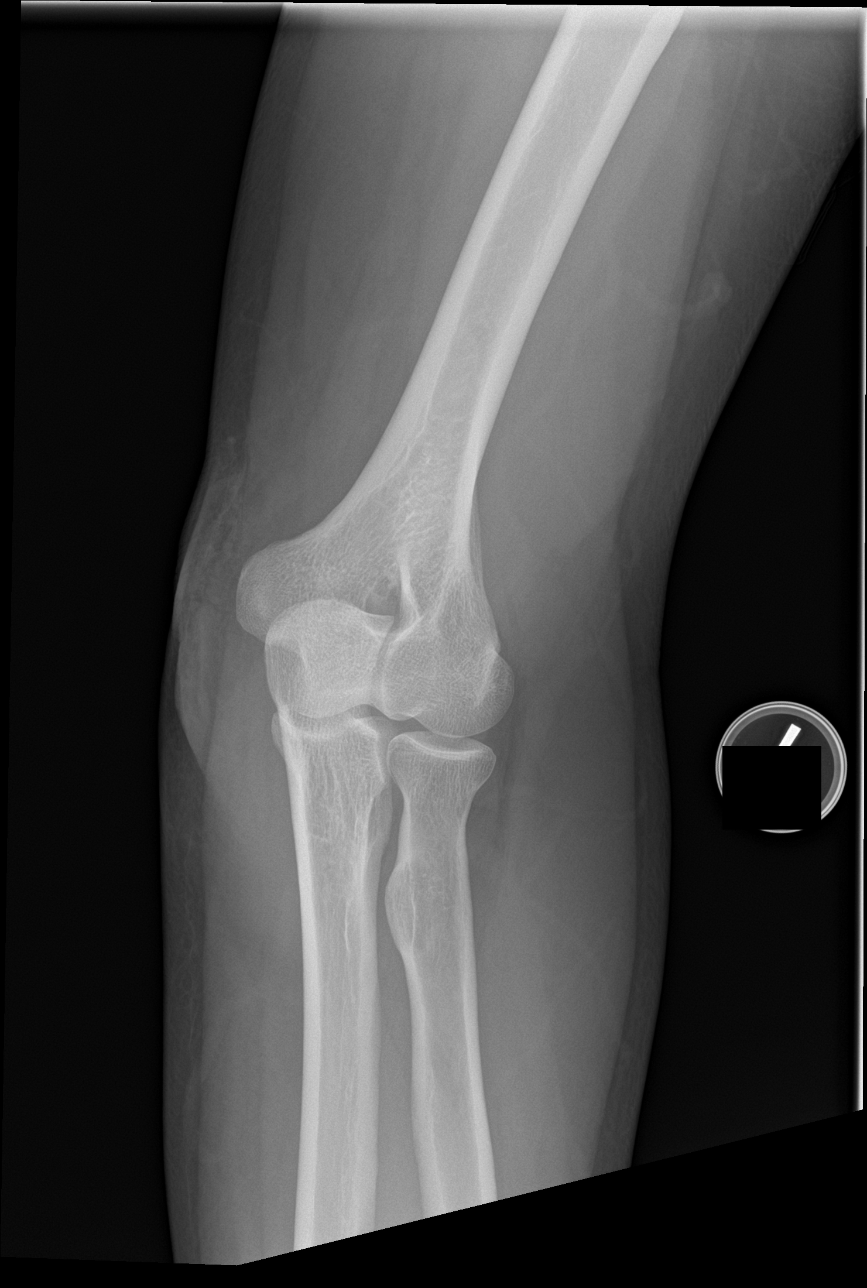

[4 of 4 positions shown; findings below may reference images not displayed]

FINDINGS: There is no evidence of fracture, dislocation, or joint effusion.
There is no evidence of arthropathy or other focal bone abnormality.
Posterior elbow soft tissue swelling without subcutaneous gas or
radiopaque foreign bodies. No effusion.
IMPRESSION: Posterior elbow soft tissue swelling seen with bursitis or
cellulitis.

No acute osseous process.

## 2019-06-17 ENCOUNTER — Other Ambulatory Visit: Payer: Self-pay | Admitting: Family Medicine

## 2019-06-17 MED FILL — HYDROCODON-APAP 7.5-325: 7.5-325 | 30 days supply | Qty: 90 | Fill #0

## 2019-07-19 ENCOUNTER — Other Ambulatory Visit: Payer: Self-pay | Admitting: Family Medicine

## 2019-07-19 MED FILL — HYDROCODON-APAP 7.5-325: 7.5-325 | 30 days supply | Qty: 90 | Fill #0

## 2019-07-19 NOTE — Telephone Encounter (Signed)
Please contact pt to set up virtual appt. Thank you

## 2019-07-19 NOTE — Telephone Encounter (Signed)
Patient needs to schedule pain management visit then we can refill it which would allow him to go ahead and get before that visit

## 2019-07-19 NOTE — Telephone Encounter (Signed)
Pt has virtual appt on 08/02/2019. Please advise. Thank you

## 2019-07-19 NOTE — Telephone Encounter (Signed)
Pt has virtual on 12/29

## 2019-07-19 NOTE — Telephone Encounter (Signed)
Last seen for pain management 03/15/19

## 2019-08-02 ENCOUNTER — Ambulatory Visit (INDEPENDENT_AMBULATORY_CARE_PROVIDER_SITE_OTHER): Payer: 59 | Admitting: Family Medicine

## 2019-08-02 ENCOUNTER — Other Ambulatory Visit: Payer: Self-pay

## 2019-08-02 DIAGNOSIS — G8929 Other chronic pain: Secondary | ICD-10-CM | POA: Diagnosis not present

## 2019-08-02 DIAGNOSIS — K219 Gastro-esophageal reflux disease without esophagitis: Secondary | ICD-10-CM

## 2019-08-02 DIAGNOSIS — M109 Gout, unspecified: Secondary | ICD-10-CM | POA: Diagnosis not present

## 2019-08-02 DIAGNOSIS — M5440 Lumbago with sciatica, unspecified side: Secondary | ICD-10-CM | POA: Diagnosis not present

## 2019-08-02 DIAGNOSIS — E7849 Other hyperlipidemia: Secondary | ICD-10-CM

## 2019-08-02 MED ORDER — HYDROCODONE-ACETAMINOPHEN 7.5-325 MG PO TABS: ORAL_TABLET | ORAL | 0 refills | Status: DC

## 2019-08-02 NOTE — Progress Notes (Signed)
Subjective:    Patient ID: Marcus Hayes, male    DOB: 07-14-1961, 58 y.o.   MRN: [id number]  HPI This patient was seen today for chronic pain. Takes for back and bilateral knee pain ADL this is Dr. Drue Novel. The medication list was reviewed and updated.   -Compliance with medication: takes 3 a day  - Number patient states they take daily: 3  -when was the last dose patient took? This morning  The patient was advised the importance of maintaining medication and not using illegal substances with these.  Here for refills and follow up  The patient was educated that we can provide 3 monthly scripts for their medication, it is their responsibility to follow the instructions.  Side effects or complications from medications: none  Patient is aware that pain medications are meant to minimize the severity of the pain to allow their pain levels to improve to allow for better function. They are aware of that pain medications cannot totally remove their pain. Pain medication does allow him to function well without it he has a difficult time Does not abuse medicine drug registry checked  Due for UDT ( at least once per year) : last one  10/21/18 He does take his cholesterol medicine regular basis watches diet tries to stay active to some degree is does smoke knows he needs to stay away from smoking Gout under good control with allopurinol denies any problems with the medication no flareups recently Pantoprazole was used to help keep his reflux under control states overall is doing well no dysphagia issues Virtual Visit via Telephone Note  I connected with Marcus Hayes on 08/02/19 at  8:30 AM EST by telephone and verified that I am speaking with the correct person using two identifiers.  Location: Patient: home Provider: office   I discussed the limitations, risks, security and privacy concerns of performing an evaluation and management service by telephone and the availability  of in person appointments. I also discussed with the patient that there may be a patient responsible charge related to this service. The patient expressed understanding and agreed to proceed.   History of Present Illness:    Observations/Objective:   Assessment and Plan:   Follow Up Instructions:    I discussed the assessment and treatment plan with the patient. The patient was provided an opportunity to ask questions and all were answered. The patient agreed with the plan and demonstrated an understanding of the instructions.   The patient was advised to call back or seek an in-person evaluation if the symptoms worsen or if the condition fails to improve as anticipated.  I provided 16 minutes of non-face-to-face time during this encounter.           Review of Systems  Constitutional: Negative for diaphoresis and fatigue.  HENT: Negative for congestion and rhinorrhea.   Respiratory: Negative for cough and shortness of breath.   Cardiovascular: Negative for chest pain and leg swelling.  Gastrointestinal: Negative for abdominal pain and diarrhea.  Skin: Negative for color change and rash.  Neurological: Negative for dizziness and headaches.  Psychiatric/Behavioral: Negative for behavioral problems and confusion.       Objective:   Physical Exam   Today's visit was via telephone Physical exam was not possible for this visit      Assessment & Plan:  The patient was seen in followup for chronic pain. A review over at their current pain status was discussed. Drug registry was checked.  Prescriptions were given. Discussion was held regarding the importance of compliance with medication as well as pain medication contract.  Time for questions regarding pain management plan occurred. Importance of regular followup visits was discussed. Patient was informed that medication may cause drowsiness and should not be combined  with other medications/alcohol or street  drugs. Patient was cautioned that medication could cause drowsiness. If the patient feels medication is causing altered alertness then do not drive or operate dangerous equipment.  Patient does well with his pain medicine allows him to function does not abuse it we will send in 3 scripts to [facility]  Reflux under good control continue current measures  No flareup of gout recently  Patient was encouraged to get his lab work completed within the next 30 days previous labs reviewed with patient 25 minutes was spent with the patient.  This statement verifies that 25 minutes was indeed spent with the patient.  More than 50% of this visit-total duration of the visit-was spent in counseling and coordination of care. The issues that the patient came in for today as reflected in the diagnosis (s) please refer to documentation for further details.   Hyperlipidemia continue current measures lab work ordered patient did not get it done he states he will get it done within the next 30 days

## 2019-08-15 ENCOUNTER — Other Ambulatory Visit: Payer: Self-pay | Admitting: Family Medicine

## 2019-08-15 MED FILL — ATORVASTATIN 20 MG TABLET: 20 | 90 days supply | Qty: 90 | Fill #0

## 2019-08-22 MED FILL — HYDROCODON-APAP 7.5-325: 7.5-325 | 30 days supply | Qty: 90 | Fill #0

## 2019-09-16 MED FILL — ALLOPURINOL 300 MG TABS: 300 | 90 days supply | Qty: 90 | Fill #1

## 2019-09-24 MED FILL — PANTOPRAZOLE SOD DR 40 MG T: 40 | 90 days supply | Qty: 180 | Fill #0

## 2019-09-24 MED FILL — HYDROCODON-APAP 7.5-325: 7.5-325 | 30 days supply | Qty: 90 | Fill #0

## 2019-10-24 MED FILL — HYDROCODON-APAP 7.5-325: 7.5-325 | 30 days supply | Qty: 90 | Fill #0

## 2019-11-26 ENCOUNTER — Other Ambulatory Visit: Payer: Self-pay | Admitting: Family Medicine

## 2019-11-28 NOTE — Telephone Encounter (Signed)
May have per may have prescription for 45 tablets, 1 3 times daily, needs office visit, please pend

## 2019-11-29 MED FILL — HYDROCODON-APAP 7.5-325: 7.5-325 | 15 days supply | Qty: 45 | Fill #0

## 2019-11-29 NOTE — Telephone Encounter (Signed)
Scheduled 5/26

## 2019-12-02 DIAGNOSIS — M109 Gout, unspecified: Secondary | ICD-10-CM | POA: Diagnosis not present

## 2019-12-02 DIAGNOSIS — Z79899 Other long term (current) drug therapy: Secondary | ICD-10-CM | POA: Diagnosis not present

## 2019-12-02 DIAGNOSIS — Z125 Encounter for screening for malignant neoplasm of prostate: Secondary | ICD-10-CM | POA: Diagnosis not present

## 2019-12-02 DIAGNOSIS — E7849 Other hyperlipidemia: Secondary | ICD-10-CM | POA: Diagnosis not present

## 2019-12-03 ENCOUNTER — Encounter: Payer: Self-pay | Admitting: Family Medicine

## 2019-12-03 LAB — PSA: Prostate Specific Ag, Serum: 0.3 ng/mL (ref 0.0–4.0)

## 2019-12-03 LAB — HEPATIC FUNCTION PANEL
ALT: 23 IU/L (ref 0–44)
AST: 17 IU/L (ref 0–40)
Albumin: 4.1 g/dL (ref 3.8–4.9)
Alkaline Phosphatase: 130 IU/L — ABNORMAL HIGH (ref 39–117)
Bilirubin Total: 0.4 mg/dL (ref 0.0–1.2)
Bilirubin, Direct: 0.13 mg/dL (ref 0.00–0.40)
Total Protein: 6.3 g/dL (ref 6.0–8.5)

## 2019-12-03 LAB — BASIC METABOLIC PANEL
BUN/Creatinine Ratio: 12 (ref 9–20)
BUN: 14 mg/dL (ref 6–24)
CO2: 23 mmol/L (ref 20–29)
Calcium: 9 mg/dL (ref 8.7–10.2)
Chloride: 106 mmol/L (ref 96–106)
Creatinine, Ser: 1.14 mg/dL (ref 0.76–1.27)
GFR calc Af Amer: 82 mL/min/{1.73_m2} (ref >59)
GFR calc non Af Amer: 71 mL/min/{1.73_m2} (ref >59)
Glucose: 100 mg/dL — ABNORMAL HIGH (ref 65–99)
Potassium: 4.3 mmol/L (ref 3.5–5.2)
Sodium: 142 mmol/L (ref 134–144)

## 2019-12-03 LAB — LIPID PANEL
Chol/HDL Ratio: 3.9 ratio (ref 0.0–5.0)
Cholesterol, Total: 174 mg/dL (ref 100–199)
HDL: 45 mg/dL (ref >39)
LDL Chol Calc (NIH): 96 mg/dL (ref 0–99)
Triglycerides: 195 mg/dL — ABNORMAL HIGH (ref 0–149)
VLDL Cholesterol Cal: 33 mg/dL (ref 5–40)

## 2019-12-03 LAB — URIC ACID: Uric Acid: 6.5 mg/dL (ref 3.8–8.4)

## 2019-12-27 ENCOUNTER — Other Ambulatory Visit: Payer: Self-pay | Admitting: Family Medicine

## 2019-12-27 MED FILL — PANTOPRAZOLE SOD DR 40 MG T: 40 | 90 days supply | Qty: 180 | Fill #1

## 2019-12-27 MED FILL — ATORVASTATIN 20 MG TABLET: 20 | 90 days supply | Qty: 90 | Fill #1

## 2019-12-28 ENCOUNTER — Other Ambulatory Visit: Payer: Self-pay | Admitting: Family Medicine

## 2019-12-28 ENCOUNTER — Telehealth: Payer: Self-pay | Admitting: Family Medicine

## 2019-12-28 ENCOUNTER — Telehealth (INDEPENDENT_AMBULATORY_CARE_PROVIDER_SITE_OTHER): Payer: 59 | Admitting: Family Medicine

## 2019-12-28 ENCOUNTER — Other Ambulatory Visit: Payer: Self-pay

## 2019-12-28 DIAGNOSIS — M109 Gout, unspecified: Secondary | ICD-10-CM | POA: Diagnosis not present

## 2019-12-28 DIAGNOSIS — G8929 Other chronic pain: Secondary | ICD-10-CM

## 2019-12-28 DIAGNOSIS — E7849 Other hyperlipidemia: Secondary | ICD-10-CM | POA: Diagnosis not present

## 2019-12-28 DIAGNOSIS — M5440 Lumbago with sciatica, unspecified side: Secondary | ICD-10-CM

## 2019-12-28 MED ORDER — HYDROCODONE-ACETAMINOPHEN 7.5-325 MG PO TABS: ORAL_TABLET | ORAL | 0 refills | Status: DC

## 2019-12-28 MED ORDER — ATORVASTATIN CALCIUM 20 MG PO TABS: 20.0000 mg | ORAL_TABLET | Freq: Every evening | ORAL | 1 refills | Status: DC

## 2019-12-28 MED ORDER — ALLOPURINOL 300 MG PO TABS: ORAL_TABLET | ORAL | 1 refills | Status: DC

## 2019-12-28 MED FILL — ALLOPURINOL 300 MG TABS: 300 | 90 days supply | Qty: 90 | Fill #0

## 2019-12-28 MED FILL — HYDROCODON-APAP 7.5-325: 7.5-325 | 30 days supply | Qty: 90 | Fill #0

## 2019-12-28 NOTE — Progress Notes (Signed)
Subjective:    Patient ID: Marcus Hayes, male    DOB: [birth year], 59 y.o.   MRN: [id number]  HPI This patient was seen today for chronic pain  The medication list was reviewed and updated.   -Compliance with medication: Hydrocodone 7.5-325  - Number patient states they take daily: 1-3  -when was the last dose patient took? This morning  The patient was advised the importance of maintaining medication and not using illegal substances with these.  Here for refills and follow up  The patient was educated that we can provide 3 monthly scripts for their medication, it is their responsibility to follow the instructions.  Side effects or complications from medications: none  Patient is aware that pain medications are meant to minimize the severity of the pain to allow their pain levels to improve to allow for better function. They are aware of that pain medications cannot totally remove their pain.  Due for UDT ( at least once per year) : last UDT completed 10/21/2018  Chronic midline low back pain with sciatica, sciatica laterality unspecified  Other hyperlipidemia  Gout, unspecified cause, unspecified chronicity, unspecified site Patient is not had any flareups of gout recently denies any fever or chills denies redness or soreness in the joints  Patient does relate low back pain and discomfort increased pain that radiates down into the buttock region pain medicine does take the edge off this allows him to function better  Patient did have recent lab work and is taking medicine for his cholesterol try to watch his diet    Virtual Visit via Telephone Note  I connected with Marcus Hayes on 12/28/19 at  8:40 AM EDT by telephone and verified that I am speaking with the correct person using two identifiers.  Location: Patient: home Provider: office   I discussed the limitations, risks, security and privacy concerns of performing an evaluation and management service by  telephone and the availability of in person appointments. I also discussed with the patient that there may be a patient responsible charge related to this service. The patient expressed understanding and agreed to proceed.   History of Present Illness:    Observations/Objective:   Assessment and Plan:   Follow Up Instructions:    I discussed the assessment and treatment plan with the patient. The patient was provided an opportunity to ask questions and all were answered. The patient agreed with the plan and demonstrated an understanding of the instructions.   The patient was advised to call back or seek an in-person evaluation if the symptoms worsen or if the condition fails to improve as anticipated.  I provided 30 minutes of non-face-to-face time during this encounter.  Between reviewing the labs renewing the medications discussing with the patient and documenting     Results for orders placed or performed in visit on 03/15/19  PSA  Result Value Ref Range   Prostate Specific Ag, Serum 0.3 0.0 - 4.0 ng/mL  Lipid panel  Result Value Ref Range   Cholesterol, Total 174 100 - 199 mg/dL   Triglycerides 196 (H) 0 - 149 mg/dL   HDL 45 >22 mg/dL   VLDL Cholesterol Cal 33 5 - 40 mg/dL   LDL Chol Calc (NIH) 96 0 - 99 mg/dL   Chol/HDL Ratio 3.9 0.0 - 5.0 ratio  Hepatic function panel  Result Value Ref Range   Total Protein 6.3 6.0 - 8.5 g/dL   Albumin 4.1 3.8 - 4.9 g/dL   Bilirubin  Total 0.4 0.0 - 1.2 mg/dL   Bilirubin, Direct 0.13 0.00 - 0.40 mg/dL   Alkaline Phosphatase 130 (H) 39 - 117 IU/L   AST 17 0 - 40 IU/L   ALT 23 0 - 44 IU/L  Basic metabolic panel  Result Value Ref Range   Glucose 100 (H) 65 - 99 mg/dL   BUN 14 6 - 24 mg/dL   Creatinine, Ser 1.14 0.76 - 1.27 mg/dL   GFR calc non Af Amer 71 >59 mL/min/1.73   GFR calc Af Amer 82 >59 mL/min/1.73   BUN/Creatinine Ratio 12 9 - 20   Sodium 142 134 - 144 mmol/L   Potassium 4.3 3.5 - 5.2 mmol/L   Chloride 106 96 - 106  mmol/L   CO2 23 20 - 29 mmol/L   Calcium 9.0 8.7 - 10.2 mg/dL  Uric acid  Result Value Ref Range   Uric Acid 6.5 3.8 - 8.4 mg/dL          Review of Systems  Constitutional: Negative for activity change.  HENT: Negative for congestion and rhinorrhea.   Respiratory: Negative for cough and shortness of breath.   Cardiovascular: Negative for chest pain.  Gastrointestinal: Negative for abdominal pain, diarrhea, nausea and vomiting.  Genitourinary: Negative for dysuria and hematuria.  Musculoskeletal: Positive for back pain.  Neurological: Negative for weakness and headaches.  Psychiatric/Behavioral: Negative for behavioral problems and confusion.       Objective:           Assessment & Plan:  Gout under good control continue current medication watch diet minimize purines in the diet  Reflux under good control with current medicine continue this  Chronic pain and discomfort patient states he does a good job of taking his medicines  The patient was seen in followup for chronic pain. A review over at their current pain status was discussed. Drug registry was checked. Prescriptions were given.  Regular follow-up recommended. Discussion was held regarding the importance of compliance with medication as well as pain medication contract.  Patient was informed that medication may cause drowsiness and should not be combined  with other medications/alcohol or street drugs. If the patient feels medication is causing altered alertness then do not drive or operate dangerous equipment.  Patient does state that the pain medicine does take the edge off pain denies any drowsiness with it  Lipid profile reviewed under decent control continue current measures  Glucose slightly elevated patient was cautioned about starches in the diet he was encouraged to exercise and to keep his glucose in good control by minimizing starches sugary drinks

## 2019-12-28 NOTE — Telephone Encounter (Signed)
[name], muscarella are scheduled for a virtual visit with your provider today.    Just as we do with appointments in the office, we must obtain your consent to participate.  Your consent will be active for this visit and any virtual visit you may have with one of our providers in the next 365 days.    If you have a MyChart account, I can also send a copy of this consent to you electronically.  All virtual visits are billed to your insurance company just like a traditional visit in the office.  As this is a virtual visit, video technology does not allow for your provider to perform a traditional examination.  This may limit your provider's ability to fully assess your condition.  If your provider identifies any concerns that need to be evaluated in person or the need to arrange testing such as labs, EKG, etc, we will make arrangements to do so.    Although advances in technology are sophisticated, we cannot ensure that it will always work on either your end or our end.  If the connection with a video visit is poor, we may have to switch to a telephone visit.  With either a video or telephone visit, we are not always able to ensure that we have a secure connection.   I need to obtain your verbal consent now.   Are you willing to proceed with your visit today?   Marcus Hayes has provided verbal consent on 12/28/2019 for a virtual visit (video or telephone).   Marlowe Shores, LPN 6/83/4196  2:22 AM

## 2020-01-03 DIAGNOSIS — A4902 Methicillin resistant Staphylococcus aureus infection, unspecified site: Secondary | ICD-10-CM

## 2020-01-03 HISTORY — DX: Methicillin resistant Staphylococcus aureus infection, unspecified site: A49.02

## 2020-01-31 MED FILL — HYDROCODON-APAP 7.5-325: 7.5-325 | 30 days supply | Qty: 90 | Fill #0

## 2020-02-27 ENCOUNTER — Other Ambulatory Visit: Payer: Self-pay | Admitting: Family Medicine

## 2020-02-27 MED FILL — HYDROCODON-APAP 7.5-325: 7.5-325 | 15 days supply | Qty: 45 | Fill #0

## 2020-03-02 ENCOUNTER — Telehealth: Payer: Self-pay | Admitting: Family Medicine

## 2020-03-02 NOTE — Telephone Encounter (Signed)
Pt's wife calling stating HYDROcodone-acetaminophen (NORCO) 7.5-325 MG tablet   Was sent to pharmacy but only half the script was sent. Wanting to know why and if can be corrected.

## 2020-03-02 NOTE — Telephone Encounter (Signed)
On 5/26 3 scripts sent in. Two for #90 and the one to be filled in July was only for #45. Pt picked up script yesterday for #45 and would like another script sent in for the reminder.

## 2020-03-05 ENCOUNTER — Other Ambulatory Visit: Payer: Self-pay | Admitting: Family Medicine

## 2020-03-05 NOTE — Telephone Encounter (Signed)
May have prescription for 45 tablets to be filled on August 9 Patient should go ahead and set up a follow-up visit for pain management by late August

## 2020-03-06 MED ORDER — HYDROCODONE-ACETAMINOPHEN 7.5-325 MG PO TABS: ORAL_TABLET | ORAL | 0 refills | Status: DC

## 2020-03-06 NOTE — Telephone Encounter (Signed)
Medication was sent in   

## 2020-03-06 NOTE — Telephone Encounter (Signed)
Patient scheduled follow up 03/30/20 with Dr Lorin Picket

## 2020-03-16 ENCOUNTER — Other Ambulatory Visit: Payer: Self-pay

## 2020-03-16 ENCOUNTER — Ambulatory Visit
Admission: EM | Admit: 2020-03-16 | Discharge: 2020-03-16 | Disposition: A | Discharge status: Home / Self Care | Payer: 59 | Attending: Emergency Medicine | Admitting: Emergency Medicine

## 2020-03-16 ENCOUNTER — Encounter: Payer: Self-pay | Admitting: Emergency Medicine

## 2020-03-16 DIAGNOSIS — M542 Cervicalgia: Secondary | ICD-10-CM

## 2020-03-16 DIAGNOSIS — M545 Low back pain: Secondary | ICD-10-CM | POA: Diagnosis not present

## 2020-03-16 DIAGNOSIS — G8929 Other chronic pain: Secondary | ICD-10-CM | POA: Diagnosis not present

## 2020-03-16 MED ORDER — KETOROLAC TROMETHAMINE 60 MG/2ML IM SOLN
60.0000 mg | Freq: Once | INTRAMUSCULAR | Status: AC
  Administered 2020-03-16: 60 mg via INTRAMUSCULAR

## 2020-03-16 MED ORDER — DEXAMETHASONE SODIUM PHOSPHATE 10 MG/ML IJ SOLN
10.0000 mg | Freq: Once | INTRAMUSCULAR | Status: AC
  Administered 2020-03-16: 10 mg via INTRAMUSCULAR

## 2020-03-16 MED ORDER — PREDNISONE 10 MG (21) PO TBPK
ORAL_TABLET | ORAL | 0 refills | Status: DC
Start: 2020-03-16 — End: 2020-03-16

## 2020-03-16 MED ORDER — PREDNISONE 10 MG (21) PO TBPK
ORAL_TABLET | ORAL | 0 refills | Status: DC
Start: 2020-03-16 — End: 2020-03-26

## 2020-03-16 MED ORDER — CYCLOBENZAPRINE HCL 5 MG PO TABS: 5.0000 mg | ORAL_TABLET | Freq: Three times a day (TID) | ORAL | 0 refills | Status: DC | PRN

## 2020-03-16 MED FILL — CYCLOBENZAPRINE HCL 5 MG TA: 5 | 10 days supply | Qty: 30 | Fill #0

## 2020-03-16 MED FILL — predniSONE 10 MG (21) TBPK: 10 | 6 days supply | Qty: 21 | Fill #0
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
## 2020-03-16 NOTE — Discharge Instructions (Addendum)
Rest, ice and heat as needed Ensure adequate range of motion as tolerated. Injuries all appear to be muscular in nature at this time Continue to take hydrocodone as prescribed for pain  Prescribed flexeril as needed at bedtime for muscle spasm.  Do not drive or operate heavy machinery while taking this medication Prescribed prednisone Expect some increased pain in the next 1-3 days.  It may take 3-4 weeks for complete resolution of symptoms Follow-up with PCP if no significant improvement within one week. Return here or go to ER if you have any new or worsening symptoms such as numbness/tingling of the inner thighs, loss of bladder or bowel control, headache/blurry vision, nausea/vomiting, confusion/altered mental status, dizziness, weakness, passing out, imbalance, etc..Marland KitchenMarland Kitchen

## 2020-03-20 MED FILL — HYDROCODON-APAP 7.5-325: 7.5-325 | 15 days supply | Qty: 45 | Fill #0

## 2020-03-26 ENCOUNTER — Encounter: Payer: Self-pay | Admitting: Family Medicine

## 2020-03-26 ENCOUNTER — Ambulatory Visit (HOSPITAL_COMMUNITY)
Admission: RE | Admit: 2020-03-26 | Discharge: 2020-03-26 | Disposition: A | Discharge status: Home / Self Care | Payer: 59 | Source: Ambulatory Visit | Attending: Family Medicine | Admitting: Family Medicine

## 2020-03-26 ENCOUNTER — Other Ambulatory Visit: Payer: Self-pay

## 2020-03-26 ENCOUNTER — Ambulatory Visit: Payer: 59 | Admitting: Family Medicine

## 2020-03-26 ENCOUNTER — Telehealth: Payer: Self-pay | Admitting: *Deleted

## 2020-03-26 VITALS — BP 138/86 | HR 97 | Temp 98.2°F | Ht 72.0 in | Wt 259.0 lb

## 2020-03-26 DIAGNOSIS — M542 Cervicalgia: Secondary | ICD-10-CM

## 2020-03-26 MED ORDER — ETODOLAC 400 MG PO TABS
400.0000 mg | ORAL_TABLET | Freq: Two times a day (BID) | ORAL | 0 refills | Status: DC
Start: 2020-03-26 — End: 2020-04-18

## 2020-03-26 MED ORDER — CHLORZOXAZONE 500 MG PO TABS: 500.0000 mg | ORAL_TABLET | Freq: Three times a day (TID) | ORAL | 0 refills | Status: DC | PRN

## 2020-03-26 NOTE — Telephone Encounter (Signed)
Stat xray results in epic for review Pt's number [phone] Wife's cell if pt does not answer [phone]
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
## 2020-03-27 NOTE — Telephone Encounter (Signed)
See result note thank you 

## 2020-03-27 NOTE — Telephone Encounter (Signed)
Patient was given results- see result note

## 2020-03-30 ENCOUNTER — Ambulatory Visit: Payer: 59 | Admitting: Family Medicine

## 2020-04-04 ENCOUNTER — Encounter: Payer: Self-pay | Admitting: Family Medicine

## 2020-04-04 ENCOUNTER — Other Ambulatory Visit: Payer: Self-pay | Admitting: Family Medicine

## 2020-04-04 ENCOUNTER — Other Ambulatory Visit: Payer: Self-pay

## 2020-04-04 ENCOUNTER — Ambulatory Visit: Payer: 59 | Admitting: Family Medicine

## 2020-04-04 VITALS — BP 128/82 | HR 95 | Temp 97.3°F | Ht 72.0 in | Wt 251.2 lb

## 2020-04-04 DIAGNOSIS — G8929 Other chronic pain: Secondary | ICD-10-CM | POA: Diagnosis not present

## 2020-04-04 DIAGNOSIS — M5440 Lumbago with sciatica, unspecified side: Secondary | ICD-10-CM | POA: Diagnosis not present

## 2020-04-04 DIAGNOSIS — Z79891 Long term (current) use of opiate analgesic: Secondary | ICD-10-CM | POA: Diagnosis not present

## 2020-04-04 MED ORDER — ATORVASTATIN CALCIUM 20 MG PO TABS: 20.0000 mg | ORAL_TABLET | Freq: Every evening | ORAL | 1 refills | Status: DC

## 2020-04-04 MED ORDER — PANTOPRAZOLE SODIUM 40 MG PO TBEC: 40.0000 mg | DELAYED_RELEASE_TABLET | Freq: Two times a day (BID) | ORAL | 1 refills | Status: DC

## 2020-04-04 MED ORDER — HYDROCODONE-ACETAMINOPHEN 7.5-325 MG PO TABS: ORAL_TABLET | ORAL | 0 refills | Status: DC

## 2020-04-04 MED ORDER — ALLOPURINOL 300 MG PO TABS: ORAL_TABLET | ORAL | 1 refills | Status: DC

## 2020-04-04 MED FILL — ALLOPURINOL 300 MG TABS: 300 | 90 days supply | Qty: 90 | Fill #0

## 2020-04-04 MED FILL — PANTOPRAZOLE SOD DR 40 MG T: 40 | 90 days supply | Qty: 180 | Fill #0

## 2020-04-04 MED FILL — HYDROCODON-APAP 7.5-325: 7.5-325 | 30 days supply | Qty: 90 | Fill #0

## 2020-04-04 MED FILL — ATORVASTATIN CALCIUM 20 MG: 20 | 90 days supply | Qty: 90 | Fill #0

## 2020-04-04 NOTE — Progress Notes (Signed)
   Subjective:    Patient ID: Marcus Hayes, male    DOB: [birth year], 59 y.o.   MRN: [id number]  HPI  This patient was seen today for chronic pain  The medication list was reviewed and updated.   -Compliance with medication: yes  - Number patient states they take daily: 3  -when was the last dose patient took? today  The patient was advised the importance of maintaining medication and not using illegal substances with these.  Here for refills and follow up  The patient was educated that we can provide 3 monthly scripts for their medication, it is their responsibility to follow the instructions.  Side effects or complications from medications: none  Patient is aware that pain medications are meant to minimize the severity of the pain to allow their pain levels to improve to allow for better function. They are aware of that pain medications cannot totally remove their pain. Patient relates that the pain medicine does help him get the pain levels down to a reasonable level to where he can function better he denies abusing the medicine Patient would like to discuss medications to help him sleep.   Review of Systems  Constitutional: Negative for diaphoresis and fatigue.  HENT: Negative for congestion and rhinorrhea.   Respiratory: Negative for cough and shortness of breath.   Cardiovascular: Negative for chest pain and leg swelling.  Gastrointestinal: Negative for abdominal pain and diarrhea.  Musculoskeletal: Positive for back pain. Negative for arthralgias.  Skin: Negative for color change and rash.  Neurological: Negative for dizziness and headaches.  Psychiatric/Behavioral: Negative for behavioral problems and confusion.       Objective:   Physical Exam Vitals reviewed.  Constitutional:      General: He is not in acute distress. HENT:     Head: Normocephalic and atraumatic.  Eyes:     General:        Right eye: No discharge.        Left eye: No discharge.  Neck:       Trachea: No tracheal deviation.  Cardiovascular:     Rate and Rhythm: Normal rate and regular rhythm.     Heart sounds: Normal heart sounds. No murmur heard.   Pulmonary:     Effort: Pulmonary effort is normal. No respiratory distress.     Breath sounds: Normal breath sounds.  Lymphadenopathy:     Cervical: No cervical adenopathy.  Skin:    General: Skin is warm and dry.  Neurological:     Mental Status: He is alert.     Coordination: Coordination normal.  Psychiatric:        Behavior: Behavior normal.           Assessment & Plan:  The patient was seen in followup for chronic pain. A review over at their current pain status was discussed. Drug registry was checked. Prescriptions were given.  Regular follow-up recommended. Discussion was held regarding the importance of compliance with medication as well as pain medication contract.  Patient was informed that medication may cause drowsiness and should not be combined  with other medications/alcohol or street drugs. If the patient feels medication is causing altered alertness then do not drive or operate dangerous equipment.  Doing well with medications Scripts were sent in Refills on his other medicines were sent in I do not recommend any medication to help sleep we talked about behavioral techniques  Patient will be due for urine drug screen on his next visit

## 2020-04-04 NOTE — Patient Instructions (Signed)
DASH Eating Plan DASH stands for "Dietary Approaches to Stop Hypertension." The DASH eating plan is a healthy eating plan that has been shown to reduce high blood pressure (hypertension). It may also reduce your risk for type 2 diabetes, heart disease, and stroke. The DASH eating plan may also help with weight loss. What are tips for following this plan?  General guidelines  Avoid eating more than 2,300 mg (milligrams) of salt (sodium) a day. If you have hypertension, you may need to reduce your sodium intake to 1,500 mg a day.  Limit alcohol intake to no more than 1 drink a day for nonpregnant women and 2 drinks a day for men. One drink equals 12 oz of beer, 5 oz of wine, or 1 oz of hard liquor.  Work with your health care provider to maintain a healthy body weight or to lose weight. Ask what an ideal weight is for you.  Get at least 30 minutes of exercise that causes your heart to beat faster (aerobic exercise) most days of the week. Activities may include walking, swimming, or biking.  Work with your health care provider or diet and nutrition specialist (dietitian) to adjust your eating plan to your individual calorie needs. Reading food labels   Check food labels for the amount of sodium per serving. Choose foods with less than 5 percent of the Daily Value of sodium. Generally, foods with less than 300 mg of sodium per serving fit into this eating plan.  To find whole grains, look for the word "whole" as the first word in the ingredient list. Shopping  Buy products labeled as "low-sodium" or "no salt added."  Buy fresh foods. Avoid canned foods and premade or frozen meals. Cooking  Avoid adding salt when cooking. Use salt-free seasonings or herbs instead of table salt or sea salt. Check with your health care provider or pharmacist before using salt substitutes.  Do not fry foods. Cook foods using healthy methods such as baking, boiling, grilling, and broiling instead.  Cook with  heart-healthy oils, such as olive, canola, soybean, or sunflower oil. Meal planning  Eat a balanced diet that includes: ? 5 or more servings of fruits and vegetables each day. At each meal, try to fill half of your plate with fruits and vegetables. ? Up to 6-8 servings of whole grains each day. ? Less than 6 oz of lean meat, poultry, or fish each day. A 3-oz serving of meat is about the same size as a deck of cards. One egg equals 1 oz. ? 2 servings of low-fat dairy each day. ? A serving of nuts, seeds, or beans 5 times each week. ? Heart-healthy fats. Healthy fats called Omega-3 fatty acids are found in foods such as flaxseeds and coldwater fish, like sardines, salmon, and mackerel.  Limit how much you eat of the following: ? Canned or prepackaged foods. ? Food that is high in trans fat, such as fried foods. ? Food that is high in saturated fat, such as fatty meat. ? Sweets, desserts, sugary drinks, and other foods with added sugar. ? Full-fat dairy products.  Do not salt foods before eating.  Try to eat at least 2 vegetarian meals each week.  Eat more home-cooked food and less restaurant, buffet, and fast food.  When eating at a restaurant, ask that your food be prepared with less salt or no salt, if possible. What foods are recommended? The items listed may not be a complete list. Talk with your dietitian about   what dietary choices are best for you. Grains Whole-grain or whole-wheat bread. Whole-grain or whole-wheat pasta. Brown rice. Oatmeal. Quinoa. Bulgur. Whole-grain and low-sodium cereals. Pita bread. Low-fat, low-sodium crackers. Whole-wheat flour tortillas. Vegetables Fresh or frozen vegetables (raw, steamed, roasted, or grilled). Low-sodium or reduced-sodium tomato and vegetable juice. Low-sodium or reduced-sodium tomato sauce and tomato paste. Low-sodium or reduced-sodium canned vegetables. Fruits All fresh, dried, or frozen fruit. Canned fruit in natural juice (without  added sugar). Meat and other protein foods Skinless chicken or turkey. Ground chicken or turkey. Pork with fat trimmed off. Fish and seafood. Egg whites. Dried beans, peas, or lentils. Unsalted nuts, nut butters, and seeds. Unsalted canned beans. Lean cuts of beef with fat trimmed off. Low-sodium, lean deli meat. Dairy Low-fat (1%) or fat-free (skim) milk. Fat-free, low-fat, or reduced-fat cheeses. Nonfat, low-sodium ricotta or cottage cheese. Low-fat or nonfat yogurt. Low-fat, low-sodium cheese. Fats and oils Soft margarine without trans fats. Vegetable oil. Low-fat, reduced-fat, or light mayonnaise and salad dressings (reduced-sodium). Canola, safflower, olive, soybean, and sunflower oils. Avocado. Seasoning and other foods Herbs. Spices. Seasoning mixes without salt. Unsalted popcorn and pretzels. Fat-free sweets. What foods are not recommended? The items listed may not be a complete list. Talk with your dietitian about what dietary choices are best for you. Grains Baked goods made with fat, such as croissants, muffins, or some breads. Dry pasta or rice meal packs. Vegetables Creamed or fried vegetables. Vegetables in a cheese sauce. Regular canned vegetables (not low-sodium or reduced-sodium). Regular canned tomato sauce and paste (not low-sodium or reduced-sodium). Regular tomato and vegetable juice (not low-sodium or reduced-sodium). Pickles. Olives. Fruits Canned fruit in a light or heavy syrup. Fried fruit. Fruit in cream or butter sauce. Meat and other protein foods Fatty cuts of meat. Ribs. Fried meat. Bacon. Sausage. Bologna and other processed lunch meats. Salami. Fatback. Hotdogs. Bratwurst. Salted nuts and seeds. Canned beans with added salt. Canned or smoked fish. Whole eggs or egg yolks. Chicken or turkey with skin. Dairy Whole or 2% milk, cream, and half-and-half. Whole or full-fat cream cheese. Whole-fat or sweetened yogurt. Full-fat cheese. Nondairy creamers. Whipped toppings.  Processed cheese and cheese spreads. Fats and oils Butter. Stick margarine. Lard. Shortening. Ghee. Bacon fat. Tropical oils, such as coconut, palm kernel, or palm oil. Seasoning and other foods Salted popcorn and pretzels. Onion salt, garlic salt, seasoned salt, table salt, and sea salt. Worcestershire sauce. Tartar sauce. Barbecue sauce. Teriyaki sauce. Soy sauce, including reduced-sodium. Steak sauce. Canned and packaged gravies. Fish sauce. Oyster sauce. Cocktail sauce. Horseradish that you find on the shelf. Ketchup. Mustard. Meat flavorings and tenderizers. Bouillon cubes. Hot sauce and Tabasco sauce. Premade or packaged marinades. Premade or packaged taco seasonings. Relishes. Regular salad dressings. Where to find more information:  National Heart, Lung, and Blood Institute: www.nhlbi.nih.gov  American Heart Association: www.heart.org Summary  The DASH eating plan is a healthy eating plan that has been shown to reduce high blood pressure (hypertension). It may also reduce your risk for type 2 diabetes, heart disease, and stroke.  With the DASH eating plan, you should limit salt (sodium) intake to 2,300 mg a day. If you have hypertension, you may need to reduce your sodium intake to 1,500 mg a day.  When on the DASH eating plan, aim to eat more fresh fruits and vegetables, whole grains, lean proteins, low-fat dairy, and heart-healthy fats.  Work with your health care provider or diet and nutrition specialist (dietitian) to adjust your eating plan to your   individual calorie needs. This information is not intended to replace advice given to you by your health care provider. Make sure you discuss any questions you have with your health care provider. Document Revised: 07/03/2017 Document Reviewed: 07/14/2016 Elsevier Patient Education  2020 Elsevier Inc.  

## 2020-04-18 ENCOUNTER — Encounter: Payer: Self-pay | Admitting: Family Medicine

## 2020-04-18 ENCOUNTER — Ambulatory Visit (INDEPENDENT_AMBULATORY_CARE_PROVIDER_SITE_OTHER): Payer: 59 | Admitting: Family Medicine

## 2020-04-18 ENCOUNTER — Other Ambulatory Visit: Payer: Self-pay

## 2020-04-18 ENCOUNTER — Other Ambulatory Visit (HOSPITAL_COMMUNITY)
Admission: RE | Admit: 2020-04-18 | Discharge: 2020-04-18 | Disposition: A | Discharge status: Home / Self Care | Payer: 59 | Source: Ambulatory Visit | Attending: Family Medicine | Admitting: Family Medicine

## 2020-04-18 VITALS — BP 122/78 | HR 114 | Temp 97.1°F | Wt 252.2 lb

## 2020-04-18 DIAGNOSIS — R079 Chest pain, unspecified: Secondary | ICD-10-CM | POA: Diagnosis not present

## 2020-04-18 DIAGNOSIS — K219 Gastro-esophageal reflux disease without esophagitis: Secondary | ICD-10-CM | POA: Diagnosis not present

## 2020-04-18 DIAGNOSIS — L299 Pruritus, unspecified: Secondary | ICD-10-CM

## 2020-04-18 LAB — TROPONIN I (HIGH SENSITIVITY): Troponin I (High Sensitivity): 6 ng/L (ref <18)

## 2020-04-18 LAB — BASIC METABOLIC PANEL
Anion gap: 10 (ref 5–15)
BUN: 22 mg/dL — ABNORMAL HIGH (ref 6–20)
CO2: 22 mmol/L (ref 22–32)
Calcium: 8.9 mg/dL (ref 8.9–10.3)
Chloride: 104 mmol/L (ref 98–111)
Creatinine, Ser: 1.3 mg/dL — ABNORMAL HIGH (ref 0.61–1.24)
GFR calc Af Amer: >60 mL/min (ref >60)
GFR calc non Af Amer: >60 mL/min (ref >60)
Glucose, Bld: 116 mg/dL — ABNORMAL HIGH (ref 70–99)
Potassium: 3.7 mmol/L (ref 3.5–5.1)
Sodium: 136 mmol/L (ref 135–145)

## 2020-04-18 MED ORDER — SUCRALFATE 1 G PO TABS
ORAL_TABLET | ORAL | 1 refills | Status: DC
Start: 2020-04-18 — End: 2020-06-14

## 2020-04-18 MED ORDER — METHYLPREDNISOLONE ACETATE 40 MG/ML IJ SUSP
40.0000 mg | Freq: Once | INTRAMUSCULAR | Status: AC
  Administered 2020-04-18: 40 mg via INTRAMUSCULAR

## 2020-04-18 MED ORDER — HYDROXYZINE HCL 25 MG PO TABS: ORAL_TABLET | ORAL | 2 refills | Status: DC
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
## 2020-04-19 ENCOUNTER — Encounter: Payer: Self-pay | Admitting: Family Medicine

## 2020-04-19 ENCOUNTER — Other Ambulatory Visit: Payer: Self-pay | Admitting: *Deleted

## 2020-04-19 DIAGNOSIS — R079 Chest pain, unspecified: Secondary | ICD-10-CM

## 2020-04-23 ENCOUNTER — Encounter: Payer: Self-pay | Admitting: *Deleted

## 2020-04-23 ENCOUNTER — Other Ambulatory Visit: Payer: Self-pay

## 2020-04-23 ENCOUNTER — Ambulatory Visit: Payer: 59 | Admitting: Cardiology

## 2020-04-23 VITALS — BP 118/84 | HR 80 | Ht 72.0 in | Wt 256.6 lb

## 2020-04-23 DIAGNOSIS — R079 Chest pain, unspecified: Secondary | ICD-10-CM

## 2020-04-23 DIAGNOSIS — R0789 Other chest pain: Secondary | ICD-10-CM
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
## 2020-04-23 NOTE — Patient Instructions (Addendum)
Medication Instructions:    Your physician recommends that you continue on your current medications as directed. Please refer to the Current Medication list given to you today.  Labwork:  None  Testing/Procedures: Your physician has requested that you have a lexiscan myoview. For further information please visit [email or web address]. Please follow instruction sheet, as given.  Follow-Up:  Your physician recommends that you schedule a follow-up appointment in: pending.  Any Other Special Instructions Will Be Listed Below (If Applicable).  If you need a refill on your cardiac medications before your next appointment, please call your pharmacy. 

## 2020-04-30 NOTE — Addendum Note (Signed)
Addended by: Lesle Chris on: 04/30/2020 09:55 AM   Modules accepted: Orders

## 2020-05-01 ENCOUNTER — Encounter (HOSPITAL_BASED_OUTPATIENT_CLINIC_OR_DEPARTMENT_OTHER)
Admission: RE | Admit: 2020-05-01 | Discharge: 2020-05-01 | Disposition: A | Discharge status: Home / Self Care | Payer: 59 | Source: Ambulatory Visit | Attending: Cardiology | Admitting: Cardiology

## 2020-05-01 ENCOUNTER — Encounter (HOSPITAL_COMMUNITY)
Admission: RE | Admit: 2020-05-01 | Discharge: 2020-05-01 | Disposition: A | Discharge status: Home / Self Care | Payer: 59 | Source: Ambulatory Visit | Attending: Dermatology | Admitting: Dermatology

## 2020-05-01 ENCOUNTER — Other Ambulatory Visit: Payer: Self-pay

## 2020-05-01 DIAGNOSIS — R079 Chest pain, unspecified: Secondary | ICD-10-CM

## 2020-05-01 LAB — NM MYOCAR MULTI W/SPECT W/WALL MOTION / EF
LV dias vol: 79 mL (ref 62–150)
LV sys vol: 28 mL
Peak HR: 92 {beats}/min
RATE: 0.41
Rest HR: 75 {beats}/min
SDS: 2
SRS: 2
SSS: 4
TID: 1.25

## 2020-05-01 MED ORDER — SODIUM CHLORIDE FLUSH 0.9 % IV SOLN
INTRAVENOUS | Status: AC
  Administered 2020-05-01: 10 mL via INTRAVENOUS
  Filled 2020-05-01: qty 10

## 2020-05-01 MED ORDER — TECHNETIUM TC 99M TETROFOSMIN IV KIT
30.0000 | PACK | Freq: Once | INTRAVENOUS | Status: AC | PRN
  Administered 2020-05-01: 30 via INTRAVENOUS

## 2020-05-01 MED ORDER — REGADENOSON 0.4 MG/5ML IV SOLN
INTRAVENOUS | Status: AC
  Administered 2020-05-01: 0.4 mg via INTRAVENOUS
  Filled 2020-05-01: qty 5

## 2020-05-01 MED ORDER — TECHNETIUM TC 99M TETROFOSMIN IV KIT
10.0000 | PACK | Freq: Once | INTRAVENOUS | Status: AC | PRN
  Administered 2020-05-01: 9.8 via INTRAVENOUS

## 2020-05-07 MED FILL — HYDROCODON-APAP 7.5-325: 7.5-325 | 30 days supply | Qty: 90 | Fill #0

## 2020-05-08 ENCOUNTER — Telehealth: Payer: Self-pay | Admitting: Cardiology

## 2020-05-08 NOTE — Telephone Encounter (Signed)
Results of nuclear stress test given to patient that were done yesterday.

## 2020-05-08 NOTE — Telephone Encounter (Signed)
Pt and Spouse requesting test results   Please call [phone] or [phone]    Thanks renee

## 2020-05-09 ENCOUNTER — Telehealth: Payer: Self-pay | Admitting: *Deleted

## 2020-05-09 NOTE — Telephone Encounter (Signed)
-----   Message from Antoine Poche, MD sent at 05/09/2020  3:56 PM EDT ----- Normal stress test, no signs of any blockages in the heart as cause of symptoms. Would f/u with pcp, may consider GI evaluation, f/u with [place] just as needed   Dominga Ferry MD

## 2020-05-16 NOTE — Telephone Encounter (Signed)
LM on wife's cell phone

## 2020-05-16 NOTE — Telephone Encounter (Signed)
Pt voiced understanding - routed to pcp  

## 2020-06-11 MED FILL — HYDROCODON-APAP 7.5-325: 7.5-325 | 30 days supply | Qty: 90 | Fill #0

## 2020-06-14 ENCOUNTER — Telehealth: Payer: Self-pay | Admitting: *Deleted

## 2020-06-14 ENCOUNTER — Telehealth: Payer: Self-pay

## 2020-06-14 ENCOUNTER — Ambulatory Visit (INDEPENDENT_AMBULATORY_CARE_PROVIDER_SITE_OTHER): Payer: 59 | Admitting: Gastroenterology

## 2020-06-14 ENCOUNTER — Encounter: Payer: Self-pay | Admitting: Gastroenterology

## 2020-06-14 ENCOUNTER — Other Ambulatory Visit: Payer: Self-pay

## 2020-06-14 DIAGNOSIS — R1310 Dysphagia, unspecified: Secondary | ICD-10-CM | POA: Diagnosis not present
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
## 2020-06-14 NOTE — Telephone Encounter (Signed)
Marcus Hayes, you are scheduled for a virtual visit with your provider today.  Just as we do with appointments in the office, we must obtain your consent to participate.  Your consent will be active for this visit and any virtual visit you may have with one of our providers in the next 365 days.  If you have a MyChart account, I can also send a copy of this consent to you electronically.  All virtual visits are billed to your insurance company just like a traditional visit in the office.  As this is a virtual visit, video technology does not allow for your provider to perform a traditional examination.  This may limit your provider's ability to fully assess your condition.  If your provider identifies any concerns that need to be evaluated in person or the need to arrange testing such as labs, EKG, etc, we will make arrangements to do so.  Although advances in technology are sophisticated, we cannot ensure that it will always work on either your end or our end.  If the connection with a video visit is poor, we may have to switch to a telephone visit.  With either a video or telephone visit, we are not always able to ensure that we have a secure connection.   I need to obtain your verbal consent now.   Are you willing to proceed with your visit today?

## 2020-06-14 NOTE — Telephone Encounter (Signed)
Called pt, EGD/DIL w/Prop w/Dr. Jena Gauss ASA 2 scheduled for 07/05/20 at 10:30am. COVID test 06/16/20 at 2:00pm. Appt letter mailed with procedure instructions. Orders entered.  PA for EGD/DIL submitted via [facility] website. Case ID: [id number].

## 2020-06-14 NOTE — Patient Instructions (Signed)
We are arranging an upper endoscopy with dilation in the near future.  Make sure to take Protonix on an empty stomach, 30 minutes before breakfast and dinner, as it is best absorbed this way.  Please chew very well, take small bites, and stick with very soft foods.   Further recommendations to follow!  I enjoyed talking with you again today! As you know, I value our relationship and want to provide genuine, compassionate, and quality care. I welcome your feedback. If you receive a survey regarding your visit,  I greatly appreciate you taking time to fill this out. See you next time!  Gelene Mink, PhD, ANP-BC [facility]

## 2020-06-14 NOTE — Progress Notes (Signed)
CC'ED TO PCP 

## 2020-06-14 NOTE — Telephone Encounter (Signed)
Pt consented to a virtual visit today.   

## 2020-06-18 ENCOUNTER — Other Ambulatory Visit: Payer: Self-pay

## 2020-06-18 ENCOUNTER — Telehealth: Payer: Self-pay

## 2020-06-18 NOTE — Telephone Encounter (Signed)
Virtual visit later today with me

## 2020-06-18 NOTE — Telephone Encounter (Signed)
No PA required for EGD.

## 2020-06-18 NOTE — Telephone Encounter (Signed)
Patient reports having positive covid test 10 days ago- today is his last day in quarantine, he is supposed to go back to work tomorrow. Patient reports terrible headache, congestion, horrible taste in his mouth with anything he tries to eat or drink and just feeling "blah". He felt better in the last couple of days but today doesn't feel good at all. No fever in the last week and no SOB.

## 2020-06-18 NOTE — Telephone Encounter (Signed)
Pt notified and scheduled.

## 2020-06-18 NOTE — Telephone Encounter (Signed)
Pt has flu like systems had Covid and yesterday was the last day pt is want to know if something can be called in or does he have to be seen.   Pt call back [phone] or [phone]

## 2020-07-03 ENCOUNTER — Other Ambulatory Visit: Payer: Self-pay | Admitting: Family Medicine

## 2020-07-03 ENCOUNTER — Telehealth: Payer: Self-pay | Admitting: Internal Medicine

## 2020-07-03 ENCOUNTER — Other Ambulatory Visit (HOSPITAL_COMMUNITY)
Admission: RE | Admit: 2020-07-03 | Discharge: 2020-07-03 | Disposition: A | Discharge status: Home / Self Care | Payer: 59 | Source: Ambulatory Visit | Attending: Internal Medicine | Admitting: Internal Medicine

## 2020-07-03 MED FILL — PANTOPRAZOLE SOD DR 40 MG T: 40 | 90 days supply | Qty: 180 | Fill #1

## 2020-07-03 MED FILL — ATORVASTATIN CALCIUM 20 MG: 20 | 90 days supply | Qty: 90 | Fill #1

## 2020-07-03 MED FILL — ALLOPURINOL 300 MG TABS: 300 | 90 days supply | Qty: 90 | Fill #1

## 2020-07-03 NOTE — Telephone Encounter (Signed)
Called pt. He has been rescheduled to 1/20 at 9:00am. Aware will mail new prep instructions with new covid test appt. Called endo and LMOVM of change.

## 2020-07-03 NOTE — Telephone Encounter (Signed)
Patient needs to postpone his procedure  Please call [phone]

## 2020-07-04 ENCOUNTER — Ambulatory Visit: Payer: 59 | Admitting: Family Medicine

## 2020-07-05 ENCOUNTER — Other Ambulatory Visit: Payer: Self-pay | Admitting: Family Medicine

## 2020-07-10 MED FILL — HYDROCODON-APAP 7.5-325: 7.5-325 | 30 days supply | Qty: 90 | Fill #0

## 2020-07-11 ENCOUNTER — Telehealth: Payer: Self-pay
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
## 2020-07-13 NOTE — Telephone Encounter (Signed)
Patient states he doesn't really know the reason for these forms he believes it is just for insurance purposes. He doesn't recall missing any days form work and he is back to his standard health - no ongoing issues.

## 2020-07-16 DIAGNOSIS — Z029 Encounter for administrative examinations, unspecified: Secondary | ICD-10-CM

## 2020-08-11 ENCOUNTER — Other Ambulatory Visit: Payer: Self-pay | Admitting: Family Medicine

## 2020-08-14 NOTE — Telephone Encounter (Signed)
Needs pain medicine follow-up please schedule Then I will do the refill

## 2020-08-15 ENCOUNTER — Telehealth: Payer: Self-pay | Admitting: Family Medicine

## 2020-08-15 ENCOUNTER — Other Ambulatory Visit: Payer: Self-pay | Admitting: Family Medicine

## 2020-08-15 MED FILL — HYDROCODON-APAP 7.5-325: 7.5-325 | 30 days supply | Qty: 90 | Fill #0

## 2020-08-15 NOTE — Telephone Encounter (Signed)
Pt inquiring about pain med (Hydrocodone ) that was sent for refill. Informed pt that he would need a pain management visit. Pt transferred up front to set up appt. Please advise. Thank you  [name]

## 2020-08-15 NOTE — Telephone Encounter (Signed)
FYI prescription was signed off on

## 2020-08-15 NOTE — Telephone Encounter (Signed)
Pt made Med Check Virtual Appt for Monday 17 at 9:00

## 2020-08-15 NOTE — Telephone Encounter (Signed)
Patient notified

## 2020-08-20 ENCOUNTER — Other Ambulatory Visit: Payer: Self-pay

## 2020-08-20 ENCOUNTER — Telehealth (INDEPENDENT_AMBULATORY_CARE_PROVIDER_SITE_OTHER): Payer: 59 | Admitting: Family Medicine

## 2020-08-20 ENCOUNTER — Other Ambulatory Visit: Payer: Self-pay | Admitting: Family Medicine

## 2020-08-20 DIAGNOSIS — Z125 Encounter for screening for malignant neoplasm of prostate: Secondary | ICD-10-CM | POA: Diagnosis not present

## 2020-08-20 DIAGNOSIS — Z79899 Other long term (current) drug therapy: Secondary | ICD-10-CM

## 2020-08-20 DIAGNOSIS — E7849 Other hyperlipidemia: Secondary | ICD-10-CM | POA: Diagnosis not present

## 2020-08-20 DIAGNOSIS — Z0000 Encounter for general adult medical examination without abnormal findings: Secondary | ICD-10-CM

## 2020-08-20 DIAGNOSIS — M5440 Lumbago with sciatica, unspecified side: Secondary | ICD-10-CM

## 2020-08-20 DIAGNOSIS — G8929 Other chronic pain: Secondary | ICD-10-CM | POA: Diagnosis not present

## 2020-08-20 DIAGNOSIS — Z79891 Long term (current) use of opiate analgesic: Secondary | ICD-10-CM

## 2020-08-20 MED ORDER — HYDROCODONE-ACETAMINOPHEN 7.5-325 MG PO TABS: ORAL_TABLET | ORAL | 0 refills | Status: DC

## 2020-08-20 NOTE — Progress Notes (Signed)
Subjective:    Patient ID: Marcus Hayes, male    DOB: 28-Sep-1960, 60 y.o.   MRN: [id number] Virtual Visit via Video Note  I connected with Marcus Hayes on 08/20/20 at  1:40 PM EST by a video enabled telemedicine application and verified that I am speaking with the correct person using two identifiers.  Location: Patient: Home Provider: Office   I discussed the limitations of evaluation and management by telemedicine and the availability of in person appointments. The patient expressed understanding and agreed to proceed.  History of Present Illness:    Observations/Objective:   Assessment and Plan:   Follow Up Instructions:    I discussed the assessment and treatment plan with the patient. The patient was provided an opportunity to ask questions and all were answered. The patient agreed with the plan and demonstrated an understanding of the instructions.   The patient was advised to call back or seek an in-person evaluation if the symptoms worsen or if the condition fails to improve as anticipated.  I provided 20 minutes of non-face-to-face time during this encounter.   Lilyan Punt, MD  This patient was seen today for chronic pain  The medication list was reviewed and updated.   -Compliance with medication: Good  - Number patient states they take daily: Approximately 3  -when was the last dose patient took?  Earlier today  The patient was advised the importance of maintaining medication and not using illegal substances with these.  Here for refills and follow up  The patient was educated that we can provide 3 monthly scripts for their medication, it is their responsibility to follow the instructions.  Side effects or complications from medications: Denies side effects states it does not cause drowsiness  Patient is aware that pain medications are meant to minimize the severity of the pain to allow their pain levels to improve to allow for better  function. They are aware of that pain medications cannot totally remove their pain.  Due for UDT ( at least once per year) : By this summer  Scale of 1 to 10 ( 1 is least 10 is most) Your pain level without the medicine: 7 Your pain level with medication 3  Scale 1 to 10 ( 1-helps very little, 10 helps very well) How well does your pain medication reduce your pain so you can function better through out the day?  8      HPI  Patient states he has been doing well lately Denies any dysphagia States he is going to put off his EGD until early spring I encouraged him not to put it off too long Patient trying to maintain a healthy diet regular activity Patient was counseled to quit smoking he is not interested in doing so currently  Review of Systems     Objective:   Physical Exam   Today's visit was via telephone Physical exam was not possible for this visit      Assessment & Plan:  The patient was seen in followup for chronic pain. A review over at their current pain status was discussed. Drug registry was checked. Prescriptions were given.  Regular follow-up recommended. Discussion was held regarding the importance of compliance with medication as well as pain medication contract.  Patient was informed that medication may cause drowsiness and should not be combined  with other medications/alcohol or street drugs. If the patient feels medication is causing altered alertness then do not drive or operate dangerous equipment.  Drug registry checked 3 scripts were sent in Follow-up by 3 months for pain management Wellness in the spring

## 2020-08-21 NOTE — Progress Notes (Signed)
08/21/20- lab orders placed and mailed to patient

## 2020-08-21 NOTE — Addendum Note (Signed)
Addended by: Marlowe Shores on: 08/21/2020 03:59 PM   Modules accepted: Orders

## 2020-08-22 ENCOUNTER — Other Ambulatory Visit (HOSPITAL_COMMUNITY)
Admission: RE | Admit: 2020-08-22 | Discharge: 2020-08-22 | Disposition: A | Discharge status: Home / Self Care | Payer: 59 | Source: Ambulatory Visit | Attending: Internal Medicine | Admitting: Internal Medicine

## 2020-08-22 ENCOUNTER — Telehealth: Payer: Self-pay | Admitting: Internal Medicine

## 2020-08-22 NOTE — Progress Notes (Signed)
Patient's procedure was canceled. Nothing's been said if patient plans to reschedule or not. It could be the weather. Patient will call when able.

## 2020-08-22 NOTE — Telephone Encounter (Signed)
PATIENT WANTS TO CANCEL HIS SURGERY

## 2020-08-22 NOTE — Telephone Encounter (Signed)
Called pt, he wants to cancel EGD for now. Will reschedule later.  Endo scheduler informed.  FYI to Lewie Loron NP.

## 2020-08-23 ENCOUNTER — Ambulatory Visit (HOSPITAL_COMMUNITY): Admission: RE | Admit: 2020-08-23 | Payer: 59 | Source: Home / Self Care | Admitting: Internal Medicine

## 2020-08-23 ENCOUNTER — Encounter (HOSPITAL_COMMUNITY): Admission: RE | Payer: Self-pay | Source: Home / Self Care

## 2020-08-23 SURGERY — ESOPHAGOGASTRODUODENOSCOPY (EGD) WITH PROPOFOL
Anesthesia: Monitor Anesthesia Care

## 2020-08-31 DIAGNOSIS — H524 Presbyopia: Secondary | ICD-10-CM | POA: Diagnosis not present

## 2020-08-31 DIAGNOSIS — H5213 Myopia, bilateral: Secondary | ICD-10-CM | POA: Diagnosis not present

## 2020-08-31 DIAGNOSIS — H52223 Regular astigmatism, bilateral: Secondary | ICD-10-CM | POA: Diagnosis not present

## 2020-09-19 MED FILL — HYDROCODON-APAP 7.5-325: 7.5-325 | 30 days supply | Qty: 90 | Fill #0

## 2020-10-31 MED FILL — PANTOPRAZOLE SOD DR 40 MG T: 40 | 90 days supply | Qty: 180 | Fill #0

## 2020-10-31 MED FILL — ATORVASTATIN CALCIUM 20 MG: 20 | 90 days supply | Qty: 90 | Fill #0

## 2020-10-31 MED FILL — ALLOPURINOL 300 MG TABS: 300 | 90 days supply | Qty: 90 | Fill #1

## 2020-11-06 ENCOUNTER — Other Ambulatory Visit (HOSPITAL_COMMUNITY): Payer: Self-pay

## 2020-11-23 ENCOUNTER — Other Ambulatory Visit (HOSPITAL_COMMUNITY): Payer: Self-pay

## 2020-11-23 ENCOUNTER — Other Ambulatory Visit: Payer: Self-pay | Admitting: Family Medicine

## 2020-11-23 MED ORDER — ALLOPURINOL 300 MG PO TABS
ORAL_TABLET | ORAL | 1 refills | Status: DC
  Filled 2020-11-23 – 2021-02-27 (×2): qty 90, 90d supply, fill #0
  Filled 2021-04-30 – 2021-06-04 (×2): qty 90, 90d supply, fill #1

## 2020-11-23 MED ORDER — ATORVASTATIN CALCIUM 20 MG PO TABS
ORAL_TABLET | Freq: Every evening | ORAL | 1 refills | Status: DC
  Filled 2020-11-23 – 2021-02-07 (×3): qty 90, 90d supply, fill #0
  Filled 2021-04-30: qty 90, 90d supply, fill #1

## 2020-11-23 MED ORDER — PANTOPRAZOLE SODIUM 40 MG PO TBEC
DELAYED_RELEASE_TABLET | Freq: Two times a day (BID) | ORAL | 1 refills | Status: DC
  Filled 2020-11-23 – 2020-12-25 (×2): qty 180, 90d supply, fill #0

## 2020-11-23 MED FILL — Hydrocodone-Acetaminophen Tab 7.5-325 MG: ORAL | 30 days supply | Qty: 90 | Fill #0 | Status: AC

## 2020-12-18 ENCOUNTER — Other Ambulatory Visit (HOSPITAL_COMMUNITY): Payer: Self-pay

## 2020-12-25 ENCOUNTER — Other Ambulatory Visit: Payer: Self-pay | Admitting: Family Medicine

## 2020-12-25 ENCOUNTER — Other Ambulatory Visit (HOSPITAL_COMMUNITY): Payer: Self-pay

## 2020-12-25 NOTE — Telephone Encounter (Signed)
Please contact patient to him set up appt for this week. Thank you!

## 2020-12-25 NOTE — Telephone Encounter (Signed)
Needs pain management visit.  I cannot just refill when the patient does not follow-up every 3 months. I am willing to work him into the schedule next week at 1120 on either Tuesday, Wednesday, Thursday, or Friday but it is specific for pain management only not for other health issues Remind the patient that in the future he needs to schedule every 3 months for pain management

## 2020-12-26 ENCOUNTER — Other Ambulatory Visit (HOSPITAL_COMMUNITY): Payer: Self-pay

## 2020-12-27 ENCOUNTER — Other Ambulatory Visit (HOSPITAL_COMMUNITY): Payer: Self-pay

## 2021-01-01 ENCOUNTER — Other Ambulatory Visit (HOSPITAL_COMMUNITY): Payer: Self-pay

## 2021-01-01 ENCOUNTER — Other Ambulatory Visit: Payer: Self-pay | Admitting: Family Medicine

## 2021-01-01 NOTE — Telephone Encounter (Signed)
Has appointment, but no refills until seen per doctor

## 2021-01-01 NOTE — Telephone Encounter (Signed)
Last visit for pain management 08/20/20

## 2021-01-02 ENCOUNTER — Other Ambulatory Visit: Payer: Self-pay

## 2021-01-02 ENCOUNTER — Encounter: Payer: Self-pay | Admitting: Family Medicine

## 2021-01-02 ENCOUNTER — Ambulatory Visit: Payer: 59 | Admitting: Family Medicine

## 2021-01-02 ENCOUNTER — Other Ambulatory Visit (HOSPITAL_COMMUNITY): Payer: Self-pay

## 2021-01-02 VITALS — BP 121/82 | HR 84 | Temp 98.1°F | Ht 72.0 in | Wt 265.0 lb

## 2021-01-02 DIAGNOSIS — L03115 Cellulitis of right lower limb: Secondary | ICD-10-CM

## 2021-01-02 DIAGNOSIS — G8929 Other chronic pain: Secondary | ICD-10-CM | POA: Diagnosis not present

## 2021-01-02 DIAGNOSIS — E7849 Other hyperlipidemia: Secondary | ICD-10-CM | POA: Diagnosis not present

## 2021-01-02 DIAGNOSIS — Z125 Encounter for screening for malignant neoplasm of prostate: Secondary | ICD-10-CM | POA: Diagnosis not present

## 2021-01-02 DIAGNOSIS — M109 Gout, unspecified: Secondary | ICD-10-CM | POA: Diagnosis not present

## 2021-01-02 DIAGNOSIS — Z79891 Long term (current) use of opiate analgesic: Secondary | ICD-10-CM | POA: Diagnosis not present

## 2021-01-02 DIAGNOSIS — M5440 Lumbago with sciatica, unspecified side: Secondary | ICD-10-CM

## 2021-01-02 MED ORDER — HYDROCODONE-ACETAMINOPHEN 7.5-325 MG PO TABS
ORAL_TABLET | ORAL | 0 refills | Status: DC
  Filled 2021-01-02: qty 90, fill #0
  Filled 2021-02-01: qty 90, 30d supply, fill #0

## 2021-01-02 MED ORDER — HYDROCODONE-ACETAMINOPHEN 7.5-325 MG PO TABS
ORAL_TABLET | ORAL | 0 refills | Status: DC
  Filled 2021-01-02: qty 90, fill #0
  Filled 2021-03-06: qty 90, 30d supply, fill #0

## 2021-01-02 MED ORDER — DOXYCYCLINE HYCLATE 100 MG PO TABS
100.0000 mg | ORAL_TABLET | Freq: Two times a day (BID) | ORAL | 0 refills | Status: DC
  Filled 2021-01-02: qty 20, 10d supply, fill #0

## 2021-01-02 MED ORDER — HYDROCODONE-ACETAMINOPHEN 7.5-325 MG PO TABS
ORAL_TABLET | ORAL | 0 refills | Status: DC
  Filled 2021-01-02: qty 90, 30d supply, fill #0

## 2021-01-02 NOTE — Progress Notes (Signed)
   Subjective:    Patient ID: Marcus Hayes, male    DOB: 05-20-1961, 60 y.o.   MRN: [id number]  HPI This patient was seen today for chronic pain  The medication list was reviewed and updated.  Location of Pain for which the patient has been treated with regarding narcotics: back pain and leg pain   Onset of this pain: years ago   -Compliance with medication: takes 3 a day  - Number patient states they take daily: 3 a day  -when was the last dose patient took? yesterday  The patient was advised the importance of maintaining medication and not using illegal substances with these.  Here for refills and follow up  The patient was educated that we can provide 3 monthly scripts for their medication, it is their responsibility to follow the instructions.  Side effects or complications from medications: none  Patient is aware that pain medications are meant to minimize the severity of the pain to allow their pain levels to improve to allow for better function. They are aware of that pain medications cannot totally remove their pain.  Due for UDT ( at least once per year) : due today   Scale of 1 to 10 ( 1 is least 10 is most) Your pain level without the medicine: 4  Your pain level with medication: 1 or 2  Scale 1 to 10 ( 1-helps very little, 10 helps very well) How well does your pain medication reduce your pain so you can function better through out the day? 1 or 2  Quality of the pain: Burning throbbing pain lower back radiates into the legs intermittently  Persistence of the pain: Present off and on almost every single day worse with standing worse with certain movements  Modifying factors: See above  Cyst on right knee. Came up about 3 days ago.    Patient states that the red area approximately the size of a quarter had a raised area in the middle when he squeezed that it just drained bloody liquid no longer draining but is tender   Review of Systems     Objective:    Physical Exam Lungs clear heart regular subjective low back pain patient able to get up and onto exam table without difficulty Has a small folliculitis no abscess noted       Assessment & Plan:  Cellulitis Warm compresses Doxycycline proper way to take it plus also avoid excessive sun Follow-up if progressive troubles  The patient was seen in followup for chronic pain. A review over at their current pain status was discussed. Drug registry was checked. Prescriptions were given.  Regular follow-up recommended. Discussion was held regarding the importance of compliance with medication as well as pain medication contract.  Patient was informed that medication may cause drowsiness and should not be combined  with other medications/alcohol or street drugs. If the patient feels medication is causing altered alertness then do not drive or operate dangerous equipment.  Patient does benefit for the pain medicine.  Does not abuse it.  Drug registry checked.  He is to follow-up in 3 months.  3 scripts were sent in.  Urine drug screen today.  Preventative labs and follow-up on other health issues later this month with a wellness

## 2021-01-10 LAB — TOXASSURE SELECT 13 (MW), URINE

## 2021-01-11 ENCOUNTER — Other Ambulatory Visit (HOSPITAL_COMMUNITY): Payer: Self-pay

## 2021-01-13 ENCOUNTER — Other Ambulatory Visit: Payer: Self-pay | Admitting: Family Medicine

## 2021-01-22 ENCOUNTER — Other Ambulatory Visit (HOSPITAL_COMMUNITY): Payer: Self-pay

## 2021-01-24 ENCOUNTER — Ambulatory Visit: Payer: 59 | Admitting: Family Medicine

## 2021-01-28 ENCOUNTER — Ambulatory Visit: Payer: 59 | Admitting: Family Medicine

## 2021-02-01 ENCOUNTER — Other Ambulatory Visit (HOSPITAL_COMMUNITY): Payer: Self-pay

## 2021-02-01 ENCOUNTER — Encounter: Payer: Self-pay | Admitting: Family Medicine

## 2021-02-01 ENCOUNTER — Other Ambulatory Visit: Payer: Self-pay

## 2021-02-01 ENCOUNTER — Ambulatory Visit (INDEPENDENT_AMBULATORY_CARE_PROVIDER_SITE_OTHER): Payer: 59 | Admitting: Family Medicine

## 2021-02-01 VITALS — BP 136/88 | HR 91 | Temp 98.2°F | Ht 72.0 in | Wt 261.0 lb

## 2021-02-01 DIAGNOSIS — M25561 Pain in right knee: Secondary | ICD-10-CM

## 2021-02-01 DIAGNOSIS — Z0000 Encounter for general adult medical examination without abnormal findings: Secondary | ICD-10-CM

## 2021-02-01 DIAGNOSIS — Z0001 Encounter for general adult medical examination with abnormal findings: Secondary | ICD-10-CM
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
## 2021-02-01 NOTE — Patient Instructions (Addendum)
Shingrix and shingles prevention: know the facts!   Shingrix is a very effective vaccine to prevent shingles.   Shingles is a reactivation of chickenpox -more than 99% of Americans born before 1980 have had chickenpox even if they do not remember it. One in every 10 people who get shingles have severe long-lasting nerve pain as a result.   33 out of a 100 older adults will get shingles if they are unvaccinated.     This vaccine is very important for your health This vaccine is indicated for anyone 50 years or older. You can get this vaccine even if you have already had shingles because you can get the disease more than once in a lifetime.  Your risk for shingles and its complications increases with age.  This vaccine has 2 doses.  The second dose would be 2 to 6 months after the first dose.  If you had Zostavax vaccine in the past you should still get Shingrix. ( Zostavax is only 70% effective and it loses significant strength over a few years .)  This vaccine is given through the pharmacy.  The cost of the vaccine is through your insurance. The pharmacy can inform you of the total costs.  Common side effects including soreness in the arm, some redness and swelling, also some feel fatigue muscle soreness headache low-grade fever.  Side effects typically go away within 2 to 3 days. Remember-the pain from shingles can last a lifetime but these side effects of the vaccine will only last a few days at most. It is very important to get both doses in order to protect yourself fully.   Please get this vaccine at your earliest convenience at your trusted pharmacy.     Steps to Quit Smoking Smoking tobacco is the leading cause of preventable death. It can affect almost every organ in the body. Smoking puts you and people around you at risk for many serious, long-lasting (chronic) diseases. Quitting smoking can be hard, but it is one of the best things thatyou can do for your health. It is  never too late to quit. How do I get ready to quit? When you decide to quit smoking, make a plan to help you succeed. Before you quit: Pick a date to quit. Set a date within the next 2 weeks to give you time to prepare. Write down the reasons why you are quitting. Keep this list in places where you will see it often. Tell your family, friends, and co-workers that you are quitting. Their support is important. Talk with your doctor about the choices that may help you quit. Find out if your health insurance will pay for these treatments. Know the people, places, things, and activities that make you want to smoke (triggers). Avoid them. What first steps can I take to quit smoking? Throw away all cigarettes at home, at work, and in your car. Throw away the things that you use when you smoke, such as ashtrays and lighters. Clean your car. Make sure to empty the ashtray. Clean your home, including curtains and carpets. What can I do to help me quit smoking? Talk with your doctor about taking medicines and seeing a counselor at the same time. You are more likely to succeed when you do both. If you are pregnant or breastfeeding, talk with your doctor about counseling or other ways to quit smoking. Do not take medicine to help you quit smoking unless your doctor tells you to do so. To quit smoking:  Quit right away Quit smoking totally, instead of slowly cutting back on how much you smoke over a period of time. Go to counseling. You are more likely to quit if you go to counseling sessions regularly. Take medicine You may take medicines to help you quit. Some medicines need a prescription, and some you can buy over-the-counter. Some medicines may contain a drug called nicotine to replace the nicotine in cigarettes. Medicines may: Help you to stop having the desire to smoke (cravings). Help to stop the problems that come when you stop smoking (withdrawal symptoms). Your doctor may ask you to  use: Nicotine patches, gum, or lozenges. Nicotine inhalers or sprays. Non-nicotine medicine that is taken by mouth. Find resources Find resources and other ways to help you quit smoking and remain smoke-free after you quit. These resources are most helpful when you use them often. They include: Online chats with a Veterinary surgeon. Phone quitlines. Printed Materials engineer. Support groups or group counseling. Text messaging programs. Mobile phone apps. Use apps on your mobile phone or tablet that can help you stick to your quit plan. There are many free apps for mobile phones and tablets as well as websites. Examples include Quit Guide from the [employer] and smokefree.gov  What things can I do to make it easier to quit?  Talk to your family and friends. Ask them to support and encourage you. Call a phone quitline (1-800-QUIT-NOW), reach out to support groups, or work with a Veterinary surgeon. Ask people who smoke to not smoke around you. Avoid places that make you want to smoke, such as: Bars. Parties. Smoke-break areas at work. Spend time with people who do not smoke. Lower the stress in your life. Stress can make you want to smoke. Try these things to help your stress: Getting regular exercise. Doing deep-breathing exercises. Doing yoga. Meditating. Doing a body scan. To do this, close your eyes, focus on one area of your body at a time from head to toe. Notice which parts of your body are tense. Try to relax the muscles in those areas. How will I feel when I quit smoking? Day 1 to 3 weeks Within the first 24 hours, you may start to have some problems that come from quitting tobacco. These problems are very bad 2-3 days after you quit, but they do not often last for more than 2-3 weeks. You may get these symptoms: Mood swings. Feeling restless, nervous, angry, or annoyed. Trouble concentrating. Dizziness. Strong desire for high-sugar foods and nicotine. Weight gain. Trouble pooping  (constipation). Feeling like you may vomit (nausea). Coughing or a sore throat. Changes in how the medicines that you take for other issues work in your body. Depression. Trouble sleeping (insomnia). Week 3 and afterward After the first 2-3 weeks of quitting, you may start to notice more positive results, such as: Better sense of smell and taste. Less coughing and sore throat. Slower heart rate. Lower blood pressure. Clearer skin. Better breathing. Fewer sick days. Quitting smoking can be hard. Do not give up if you fail the first time. Some people need to try a few times before they succeed. Do your best to stick to your quit plan, and talk with yourdoctor if you have any questions or concerns. Summary Smoking tobacco is the leading cause of preventable death. Quitting smoking can be hard, but it is one of the best things that you can do for your health. When you decide to quit smoking, make a plan to help you succeed. Quit  smoking right away, not slowly over a period of time. When you start quitting, seek help from your doctor, family, or friends. This information is not intended to replace advice given to you by your health care provider. Make sure you discuss any questions you have with your healthcare provider. Document Revised: 04/15/2019 Document Reviewed: 10/09/2018 Elsevier Patient Education  2022 [employer].

## 2021-02-07 ENCOUNTER — Other Ambulatory Visit (HOSPITAL_COMMUNITY): Payer: Self-pay

## 2021-02-27 ENCOUNTER — Other Ambulatory Visit (HOSPITAL_COMMUNITY): Payer: Self-pay

## 2021-03-06 ENCOUNTER — Other Ambulatory Visit (HOSPITAL_COMMUNITY): Payer: Self-pay

## 2021-04-09 ENCOUNTER — Other Ambulatory Visit: Payer: Self-pay | Admitting: Family Medicine

## 2021-04-09 ENCOUNTER — Other Ambulatory Visit (HOSPITAL_COMMUNITY): Payer: Self-pay

## 2021-04-09 MED ORDER — HYDROCODONE-ACETAMINOPHEN 7.5-325 MG PO TABS
ORAL_TABLET | ORAL | 0 refills | Status: DC
  Filled 2021-04-09: qty 90, 30d supply, fill #0

## 2021-04-10 ENCOUNTER — Other Ambulatory Visit (HOSPITAL_COMMUNITY): Payer: Self-pay

## 2021-04-30 ENCOUNTER — Other Ambulatory Visit (HOSPITAL_COMMUNITY): Payer: Self-pay

## 2021-04-30 ENCOUNTER — Other Ambulatory Visit: Payer: Self-pay | Admitting: Family Medicine

## 2021-05-01 ENCOUNTER — Other Ambulatory Visit (HOSPITAL_COMMUNITY): Payer: Self-pay

## 2021-05-01 MED ORDER — HYDROCODONE-ACETAMINOPHEN 7.5-325 MG PO TABS
ORAL_TABLET | ORAL | 0 refills | Status: DC
  Filled 2021-05-01: qty 45, 15d supply, fill #0

## 2021-05-02 ENCOUNTER — Other Ambulatory Visit (HOSPITAL_COMMUNITY): Payer: Self-pay

## 2021-05-06 ENCOUNTER — Other Ambulatory Visit (HOSPITAL_COMMUNITY): Payer: Self-pay

## 2021-05-06 ENCOUNTER — Encounter: Payer: Self-pay | Admitting: Family Medicine

## 2021-05-06 ENCOUNTER — Other Ambulatory Visit: Payer: Self-pay

## 2021-05-06 ENCOUNTER — Ambulatory Visit: Payer: 59 | Admitting: Family Medicine

## 2021-05-06 VITALS — BP 146/88 | HR 100 | Temp 98.6°F | Ht 72.0 in | Wt 267.0 lb

## 2021-05-06 DIAGNOSIS — M5440 Lumbago with sciatica, unspecified side: Secondary | ICD-10-CM | POA: Diagnosis not present

## 2021-05-06 DIAGNOSIS — R52 Pain, unspecified: Secondary | ICD-10-CM | POA: Diagnosis not present

## 2021-05-06 DIAGNOSIS — G8929 Other chronic pain: Secondary | ICD-10-CM

## 2021-05-06 MED ORDER — HYDROCODONE-ACETAMINOPHEN 7.5-325 MG PO TABS
ORAL_TABLET | ORAL | 0 refills | Status: DC
  Filled 2021-05-06: qty 90, fill #0
  Filled 2021-05-13: qty 90, 30d supply, fill #0

## 2021-05-06 MED ORDER — HYDROCODONE-ACETAMINOPHEN 7.5-325 MG PO TABS
ORAL_TABLET | ORAL | 0 refills | Status: DC
  Filled 2021-05-06: qty 90, fill #0
  Filled 2021-06-13: qty 90, 30d supply, fill #0

## 2021-05-06 MED ORDER — HYDROCODONE-ACETAMINOPHEN 7.5-325 MG PO TABS
ORAL_TABLET | ORAL | 0 refills | Status: DC
  Filled 2021-05-06: qty 90, fill #0
  Filled 2021-07-12: qty 90, 30d supply, fill #0

## 2021-05-06 NOTE — Progress Notes (Addendum)
   Subjective:    Patient ID: Marcus Hayes, male    DOB: Apr 19, 1961, 60 y.o.   MRN: [id number] This verifies that the history review of any tests, physical exam, assessment and plan were conducted by Dr. Lilyan Punt and documented accordingly by him today Lilyan Punt MD primary care [facility]  HPI This patient was seen today for chronic pain  The medication list was reviewed and updated.  Location of Pain for which the patient has been treated with regarding narcotics: lumbar 2, 3 , 4  Onset of this pain: chronic   -Compliance with medication: yes  - Number patient states they take daily: 3 q day  -when was the last dose patient took? This am   The patient was advised the importance of maintaining medication and not using illegal substances with these.  Here for refills and follow up  The patient was educated that we can provide 3 monthly scripts for their medication, it is their responsibility to follow the instructions.  Side effects or complications from medications: no  Patient is aware that pain medications are meant to minimize the severity of the pain to allow their pain levels to improve to allow for better function. They are aware of that pain medications cannot totally remove their pain.  Due for UDT ( at least once per year) : 062022  Scale of 1 to 10 ( 1 is least 10 is most) Your pain level without the medicine: 7/8 Your pain level with medication 3  Scale 1 to 10 ( 1-helps very little, 10 helps very well) How well does your pain medication reduce your pain so you can function better through out the day? 4  Quality of the pain: achy  Persistence of the pain: all times  Modifying factors: none        Review of Systems     Objective:   Physical Exam  General-in no acute distress Eyes-no discharge Lungs-respiratory rate normal, CTA CV-no murmurs,RRR Extremities skin warm dry no edema Neuro grossly normal Behavior normal,  alert       Assessment & Plan:  Blood pressure mild elevation healthier diet avoid smoking work hard on trying to quit smoking recheck blood pressure in November  The patient was seen in followup for chronic pain. A review over at their current pain status was discussed. Drug registry was checked. Prescriptions were given.  Regular follow-up recommended. Discussion was held regarding the importance of compliance with medication as well as pain medication contract.  Patient was informed that medication may cause drowsiness and should not be combined  with other medications/alcohol or street drugs. If the patient feels medication is causing altered alertness then do not drive or operate dangerous equipment.

## 2021-05-06 NOTE — Patient Instructions (Signed)

## 2021-05-13 ENCOUNTER — Other Ambulatory Visit (HOSPITAL_COMMUNITY): Payer: Self-pay

## 2021-06-04 ENCOUNTER — Other Ambulatory Visit (HOSPITAL_COMMUNITY): Payer: Self-pay

## 2021-06-13 ENCOUNTER — Other Ambulatory Visit (HOSPITAL_COMMUNITY): Payer: Self-pay

## 2021-06-17 ENCOUNTER — Other Ambulatory Visit (HOSPITAL_COMMUNITY): Payer: Self-pay

## 2021-06-17 ENCOUNTER — Other Ambulatory Visit: Payer: Self-pay

## 2021-06-17 ENCOUNTER — Ambulatory Visit: Payer: 59 | Admitting: Family Medicine

## 2021-06-17 VITALS — BP 127/83 | HR 85 | Temp 96.8°F | Ht 72.0 in | Wt 262.0 lb

## 2021-06-17 DIAGNOSIS — Z125 Encounter for screening for malignant neoplasm of prostate: Secondary | ICD-10-CM

## 2021-06-17 DIAGNOSIS — Z79891 Long term (current) use of opiate analgesic: Secondary | ICD-10-CM | POA: Diagnosis not present

## 2021-06-17 DIAGNOSIS — M109 Gout, unspecified: Secondary | ICD-10-CM | POA: Diagnosis not present

## 2021-06-17 DIAGNOSIS — E7849 Other hyperlipidemia: Secondary | ICD-10-CM | POA: Diagnosis not present

## 2021-06-17 DIAGNOSIS — Z23 Encounter for immunization: Secondary | ICD-10-CM

## 2021-06-17 MED ORDER — ALLOPURINOL 300 MG PO TABS
ORAL_TABLET | ORAL | 1 refills | Status: DC
  Filled 2021-06-17: qty 90, fill #0
  Filled 2021-08-12: qty 90, 90d supply, fill #0
  Filled 2021-12-13: qty 90, 90d supply, fill #1

## 2021-06-17 MED ORDER — ATORVASTATIN CALCIUM 20 MG PO TABS
ORAL_TABLET | Freq: Every evening | ORAL | 1 refills | Status: DC
  Filled 2021-06-17: qty 90, fill #0
  Filled 2021-08-12: qty 90, 90d supply, fill #0
  Filled 2021-12-13: qty 90, 90d supply, fill #1

## 2021-06-17 MED ORDER — PANTOPRAZOLE SODIUM 40 MG PO TBEC
DELAYED_RELEASE_TABLET | Freq: Two times a day (BID) | ORAL | 1 refills | Status: DC
Start: 2021-06-17 — End: 2021-12-13
  Filled 2021-06-17: qty 180, 90d supply, fill #0
  Filled 2021-09-13: qty 180, 90d supply, fill #1

## 2021-06-17 NOTE — Progress Notes (Signed)
   Subjective:    Patient ID: Marcus Hayes, male    DOB: Apr 19, 1961, 60 y.o.   MRN: [id number]  HPI Patient for follow-up regarding blood pressure Blood pressure is elevated above what we would like to see Dietary measures reviewed Minimize salt Stay physically active Encourage patient to quit smoking    Review of Systems     Objective:   Physical Exam General-in no acute distress Eyes-no discharge Lungs-respiratory rate normal, CTA CV-no murmurs,RRR Extremities skin warm dry no edema Neuro grossly normal Behavior normal, alert        Assessment & Plan:  1. Encounter for long-term opiate analgesic use Prescriptions were issued on the last visit he will do a follow-up visit in approximately 2 months - PSA - Lipid panel - Hepatic function panel - Basic metabolic panel - CBC with Differential/Platelet - Uric acid  2. Other hyperlipidemia Check lab work await results - PSA - Lipid panel - Hepatic function panel - Basic metabolic panel - CBC with Differential/Platelet - Uric acid  3. Gout, unspecified cause, unspecified chronicity, unspecified site Check lab work await results - PSA - Lipid panel - Hepatic function panel - Basic metabolic panel - CBC with Differential/Platelet - Uric acid  4. Screening PSA (prostate specific antigen) Ordered await results - PSA - Lipid panel - Hepatic function panel - Basic metabolic panel - CBC with Differential/Platelet - Uric acid  5. Screening for prostate cancer Await results - PSA - Lipid panel - Hepatic function panel - Basic metabolic panel - CBC with Differential/Platelet - Uric acid  6. Immunization due Pneumonia shot today - Pneumococcal conjugate vaccine 20-valent (Prevnar 20)  Blood pressure good on recheck.  Encourage patient very strongly to quit smoking

## 2021-06-17 NOTE — Patient Instructions (Addendum)
DASH Eating Plan DASH stands for Dietary Approaches to Stop Hypertension. The DASH eating plan is a healthy eating plan that has been shown to: Reduce high blood pressure (hypertension). Reduce your risk for type 2 diabetes, heart disease, and stroke. Help with weight loss. What are tips for following this plan? Reading food labels Check food labels for the amount of salt (sodium) per serving. Choose foods with less than 5 percent of the Daily Value of sodium. Generally, foods with less than 300 milligrams (mg) of sodium per serving fit into this eating plan. To find whole grains, look for the word "whole" as the first word in the ingredient list. Shopping Buy products labeled as "low-sodium" or "no salt added." Buy fresh foods. Avoid canned foods and pre-made or frozen meals. Cooking Avoid adding salt when cooking. Use salt-free seasonings or herbs instead of table salt or sea salt. Check with your health care provider or pharmacist before using salt substitutes. Do not fry foods. Cook foods using healthy methods such as baking, boiling, grilling, roasting, and broiling instead. Cook with heart-healthy oils, such as olive, canola, avocado, soybean, or sunflower oil. Meal planning  Eat a balanced diet that includes: 4 or more servings of fruits and 4 or more servings of vegetables each day. Try to fill one-half of your plate with fruits and vegetables. 6-8 servings of whole grains each day. Less than 6 oz (170 g) of lean meat, poultry, or fish each day. A 3-oz (85-g) serving of meat is about the same size as a deck of cards. One egg equals 1 oz (28 g). 2-3 servings of low-fat dairy each day. One serving is 1 cup (237 mL). 1 serving of nuts, seeds, or beans 5 times each week. 2-3 servings of heart-healthy fats. Healthy fats called omega-3 fatty acids are found in foods such as walnuts, flaxseeds, fortified milks, and eggs. These fats are also found in cold-water fish, such as sardines, salmon,  and mackerel. Limit how much you eat of: Canned or prepackaged foods. Food that is high in trans fat, such as some fried foods. Food that is high in saturated fat, such as fatty meat. Desserts and other sweets, sugary drinks, and other foods with added sugar. Full-fat dairy products. Do not salt foods before eating. Do not eat more than 4 egg yolks a week. Try to eat at least 2 vegetarian meals a week. Eat more home-cooked food and less restaurant, buffet, and fast food. Lifestyle When eating at a restaurant, ask that your food be prepared with less salt or no salt, if possible. If you drink alcohol: Limit how much you use to: 0-1 drink a day for women who are not pregnant. 0-2 drinks a day for men. Be aware of how much alcohol is in your drink. In the [place] one drink equals one 12 oz bottle of beer (355 mL), one 5 oz glass of wine (148 mL), or one 1 oz glass of hard liquor (44 mL). General information Avoid eating more than 2,300 mg of salt a day. If you have hypertension, you may need to reduce your sodium intake to 1,500 mg a day. Work with your health care provider to maintain a healthy body weight or to lose weight. Ask what an ideal weight is for you. Get at least 30 minutes of exercise that causes your heart to beat faster (aerobic exercise) most days of the week. Activities may include walking, swimming, or biking. Work with your health care provider or dietitian to   adjust your eating plan to your individual calorie needs. What foods should I eat? Fruits All fresh, dried, or frozen fruit. Canned fruit in natural juice (without added sugar). Vegetables Fresh or frozen vegetables (raw, steamed, roasted, or grilled). Low-sodium or reduced-sodium tomato and vegetable juice. Low-sodium or reduced-sodium tomato sauce and tomato paste. Low-sodium or reduced-sodium canned vegetables. Grains Whole-grain or whole-wheat bread. Whole-grain or whole-wheat pasta. Brown rice. Oatmeal. Quinoa.  Bulgur. Whole-grain and low-sodium cereals. Pita bread. Low-fat, low-sodium crackers. Whole-wheat flour tortillas. Meats and other proteins Skinless chicken or turkey. Ground chicken or turkey. Pork with fat trimmed off. Fish and seafood. Egg whites. Dried beans, peas, or lentils. Unsalted nuts, nut butters, and seeds. Unsalted canned beans. Lean cuts of beef with fat trimmed off. Low-sodium, lean precooked or cured meat, such as sausages or meat loaves. Dairy Low-fat (1%) or fat-free (skim) milk. Reduced-fat, low-fat, or fat-free cheeses. Nonfat, low-sodium ricotta or cottage cheese. Low-fat or nonfat yogurt. Low-fat, low-sodium cheese. Fats and oils Soft margarine without trans fats. Vegetable oil. Reduced-fat, low-fat, or light mayonnaise and salad dressings (reduced-sodium). Canola, safflower, olive, avocado, soybean, and sunflower oils. Avocado. Seasonings and condiments Herbs. Spices. Seasoning mixes without salt. Other foods Unsalted popcorn and pretzels. Fat-free sweets. The items listed above may not be a complete list of foods and beverages you can eat. Contact a dietitian for more information. What foods should I avoid? Fruits Canned fruit in a light or heavy syrup. Fried fruit. Fruit in cream or butter sauce. Vegetables Creamed or fried vegetables. Vegetables in a cheese sauce. Regular canned vegetables (not low-sodium or reduced-sodium). Regular canned tomato sauce and paste (not low-sodium or reduced-sodium). Regular tomato and vegetable juice (not low-sodium or reduced-sodium). Pickles. Olives. Grains Baked goods made with fat, such as croissants, muffins, or some breads. Dry pasta or rice meal packs. Meats and other proteins Fatty cuts of meat. Ribs. Fried meat. Bacon. Bologna, salami, and other precooked or cured meats, such as sausages or meat loaves. Fat from the back of a pig (fatback). Bratwurst. Salted nuts and seeds. Canned beans with added salt. Canned or smoked fish.  Whole eggs or egg yolks. Chicken or turkey with skin. Dairy Whole or 2% milk, cream, and half-and-half. Whole or full-fat cream cheese. Whole-fat or sweetened yogurt. Full-fat cheese. Nondairy creamers. Whipped toppings. Processed cheese and cheese spreads. Fats and oils Butter. Stick margarine. Lard. Shortening. Ghee. Bacon fat. Tropical oils, such as coconut, palm kernel, or palm oil. Seasonings and condiments Onion salt, garlic salt, seasoned salt, table salt, and sea salt. Worcestershire sauce. Tartar sauce. Barbecue sauce. Teriyaki sauce. Soy sauce, including reduced-sodium. Steak sauce. Canned and packaged gravies. Fish sauce. Oyster sauce. Cocktail sauce. Store-bought horseradish. Ketchup. Mustard. Meat flavorings and tenderizers. Bouillon cubes. Hot sauces. Pre-made or packaged marinades. Pre-made or packaged taco seasonings. Relishes. Regular salad dressings. Other foods Salted popcorn and pretzels. The items listed above may not be a complete list of foods and beverages you should avoid. Contact a dietitian for more information. Where to find more information National Heart, Lung, and Blood Institute: www.nhlbi.nih.gov American Heart Association: www.heart.org Academy of Nutrition and Dietetics: www.eatright.org National Kidney Foundation: www.kidney.org Summary The DASH eating plan is a healthy eating plan that has been shown to reduce high blood pressure (hypertension). It may also reduce your risk for type 2 diabetes, heart disease, and stroke. When on the DASH eating plan, aim to eat more fresh fruits and vegetables, whole grains, lean proteins, low-fat dairy, and heart-healthy fats. With the DASH   eating plan, you should limit salt (sodium) intake to 2,300 mg a day. If you have hypertension, you may need to reduce your sodium intake to 1,500 mg a day. Work with your health care provider or dietitian to adjust your eating plan to your individual calorie needs. This information is not  intended to replace advice given to you by your health care provider. Make sure you discuss any questions you have with your health care provider. Document Revised: 06/24/2019 Document Reviewed: 06/24/2019 Elsevier Patient Education  2022 Elsevier Inc. Steps to Quit Smoking Smoking tobacco is the leading cause of preventable death. It can affect almost every organ in the body. Smoking puts you and those around you at risk for developing many serious chronic diseases. Quitting smoking can be difficult, but it is one of the best things that you can do for your health. It is never too late to quit. How do I get ready to quit? When you decide to quit smoking, create a plan to help you succeed. Before you quit: Pick a date to quit. Set a date within the next 2 weeks to give you time to prepare. Write down the reasons why you are quitting. Keep this list in places where you will see it often. Tell your family, friends, and co-workers that you are quitting. Support from your loved ones can make quitting easier. Talk with your health care provider about your options for quitting smoking. Find out what treatment options are covered by your health insurance. Identify people, places, things, and activities that make you want to smoke (triggers). Avoid them. What first steps can I take to quit smoking? Throw away all cigarettes at home, at work, and in your car. Throw away smoking accessories, such as Set designer. Clean your car. Make sure to empty the ashtray. Clean your home, including curtains and carpets. What strategies can I use to quit smoking? Talk with your health care provider about combining strategies, such as taking medicines while you are also receiving in-person counseling. Using these two strategies together makes you more likely to succeed in quitting than if you used either strategy on its own. If you are pregnant or breastfeeding, talk with your health care provider about  finding counseling or other support strategies to quit smoking. Do not take medicine to help you quit smoking unless your health care provider tells you to do so. To quit smoking: Quit right away Quit smoking completely, instead of gradually reducing how much you smoke over a period of time. Research shows that stopping smoking right away is more successful than gradually quitting. Attend in-person counseling to help you build problem-solving skills. You are more likely to succeed in quitting if you attend counseling sessions regularly. Even short sessions of 10 minutes can be effective. Take medicine You may take medicines to help you quit smoking. Some medicines require a prescription and some you can purchase over-the-counter. Medicines may have nicotine in them to replace the nicotine in cigarettes. Medicines may: Help to stop cravings. Help to relieve withdrawal symptoms. Your health care provider may recommend: Nicotine patches, gum, or lozenges. Nicotine inhalers or sprays. Non-nicotine medicine that is taken by mouth. Find resources Find resources and support systems that can help you to quit smoking and remain smoke-free after you quit. These resources are most helpful when you use them often. They include: Online chats with a Veterinary surgeon. Telephone quitlines. Printed Materials engineer. Support groups or group counseling. Text messaging programs. Mobile phone apps or applications.  Use apps that can help you stick to your quit plan by providing reminders, tips, and encouragement. There are many free apps for mobile devices as well as websites. Examples include Quit Guide from the [employer] and smokefree.gov What things can I do to make it easier to quit?  Reach out to your family and friends for support and encouragement. Call telephone quitlines (1-800-QUIT-NOW), reach out to support groups, or work with a counselor for support. Ask people who smoke to avoid smoking around you. Avoid  places that trigger you to smoke, such as bars, parties, or smoke-break areas at work. Spend time with people who do not smoke. Lessen the stress in your life. Stress can be a smoking trigger for some people. To lessen stress, try: Exercising regularly. Doing deep-breathing exercises. Doing yoga. Meditating. Performing a body scan. This involves closing your eyes, scanning your body from head to toe, and noticing which parts of your body are particularly tense. Try to relax the muscles in those areas. How will I feel when I quit smoking? Day 1 to 3 weeks Within the first 24 hours of quitting smoking, you may start to feel withdrawal symptoms. These symptoms are usually most noticeable 2-3 days after quitting, but they usually do not last for more than 2-3 weeks. You may experience these symptoms: Mood swings. Restlessness, anxiety, or irritability. Trouble concentrating. Dizziness. Strong cravings for sugary foods and nicotine. Mild weight gain. Constipation. Nausea. Coughing or a sore throat. Changes in how the medicines that you take for unrelated issues work in your body. Depression. Trouble sleeping (insomnia). Week 3 and afterward After the first 2-3 weeks of quitting, you may start to notice more positive results, such as: Improved sense of smell and taste. Decreased coughing and sore throat. Slower heart rate. Lower blood pressure. Clearer skin. The ability to breathe more easily. Fewer sick days. Quitting smoking can be very challenging. Do not get discouraged if you are not successful the first time. Some people need to make many attempts to quit before they achieve long-term success. Do your best to stick to your quit plan, and talk with your health care provider if you have any questions or concerns. Summary Smoking tobacco is the leading cause of preventable death. Quitting smoking is one of the best things that you can do for your health. When you decide to quit  smoking, create a plan to help you succeed. Quit smoking right away, not slowly over a period of time. When you start quitting, seek help from your health care provider, family, or friends. This information is not intended to replace advice given to you by your health care provider. Make sure you discuss any questions you have with your health care provider. Document Revised: 03/29/2021 Document Reviewed: 10/09/2018 Elsevier Patient Education  2022 [employer].

## 2021-06-21 DIAGNOSIS — E7849 Other hyperlipidemia: Secondary | ICD-10-CM | POA: Diagnosis not present

## 2021-06-21 DIAGNOSIS — M109 Gout, unspecified: Secondary | ICD-10-CM | POA: Diagnosis not present

## 2021-06-21 DIAGNOSIS — Z79891 Long term (current) use of opiate analgesic: Secondary | ICD-10-CM | POA: Diagnosis not present

## 2021-06-21 DIAGNOSIS — Z125 Encounter for screening for malignant neoplasm of prostate: Secondary | ICD-10-CM | POA: Diagnosis not present

## 2021-06-22 ENCOUNTER — Encounter: Payer: Self-pay | Admitting: Family Medicine

## 2021-06-22 LAB — CBC WITH DIFFERENTIAL/PLATELET
Basophils Absolute: 0 10*3/uL (ref 0.0–0.2)
Basos: 1 %
EOS (ABSOLUTE): 0.2 10*3/uL (ref 0.0–0.4)
Eos: 3 %
Hematocrit: 48.1 % (ref 37.5–51.0)
Hemoglobin: 16.1 g/dL (ref 13.0–17.7)
Immature Grans (Abs): 0 10*3/uL (ref 0.0–0.1)
Immature Granulocytes: 0 %
Lymphocytes Absolute: 1.5 10*3/uL (ref 0.7–3.1)
Lymphs: 21 %
MCH: 30.2 pg (ref 26.6–33.0)
MCHC: 33.5 g/dL (ref 31.5–35.7)
MCV: 90 fL (ref 79–97)
Monocytes Absolute: 0.8 10*3/uL (ref 0.1–0.9)
Monocytes: 11 %
Neutrophils Absolute: 4.7 10*3/uL (ref 1.4–7.0)
Neutrophils: 64 %
Platelets: 235 10*3/uL (ref 150–450)
RBC: 5.33 x10E6/uL (ref 4.14–5.80)
RDW: 13.1 % (ref 11.6–15.4)
WBC: 7.3 10*3/uL (ref 3.4–10.8)

## 2021-06-22 LAB — BASIC METABOLIC PANEL
BUN/Creatinine Ratio: 15 (ref 9–20)
BUN: 14 mg/dL (ref 6–24)
CO2: 20 mmol/L (ref 20–29)
Calcium: 9.6 mg/dL (ref 8.7–10.2)
Chloride: 103 mmol/L (ref 96–106)
Creatinine, Ser: 0.93 mg/dL (ref 0.76–1.27)
Glucose: 101 mg/dL — ABNORMAL HIGH (ref 70–99)
Potassium: 4.8 mmol/L (ref 3.5–5.2)
Sodium: 139 mmol/L (ref 134–144)
eGFR: 95 mL/min/{1.73_m2} (ref >59)

## 2021-06-22 LAB — HEPATIC FUNCTION PANEL
ALT: 19 IU/L (ref 0–44)
AST: 12 IU/L (ref 0–40)
Albumin: 4.1 g/dL (ref 3.8–4.9)
Alkaline Phosphatase: 126 IU/L — ABNORMAL HIGH (ref 44–121)
Bilirubin Total: 0.2 mg/dL (ref 0.0–1.2)
Bilirubin, Direct: <0.1 mg/dL (ref 0.00–0.40)
Total Protein: 6.2 g/dL (ref 6.0–8.5)

## 2021-06-22 LAB — URIC ACID: Uric Acid: 4.6 mg/dL (ref 3.8–8.4)

## 2021-06-22 LAB — LIPID PANEL
Chol/HDL Ratio: 4 ratio (ref 0.0–5.0)
Cholesterol, Total: 171 mg/dL (ref 100–199)
HDL: 43 mg/dL (ref >39)
LDL Chol Calc (NIH): 84 mg/dL (ref 0–99)
Triglycerides: 267 mg/dL — ABNORMAL HIGH (ref 0–149)
VLDL Cholesterol Cal: 44 mg/dL — ABNORMAL HIGH (ref 5–40)

## 2021-06-22 LAB — PSA: Prostate Specific Ag, Serum: 0.3 ng/mL (ref 0.0–4.0)

## 2021-07-12 ENCOUNTER — Other Ambulatory Visit (HOSPITAL_COMMUNITY): Payer: Self-pay

## 2021-08-12 ENCOUNTER — Other Ambulatory Visit: Payer: Self-pay | Admitting: Family Medicine

## 2021-08-12 ENCOUNTER — Other Ambulatory Visit (HOSPITAL_COMMUNITY): Payer: Self-pay

## 2021-08-12 MED ORDER — HYDROCODONE-ACETAMINOPHEN 7.5-325 MG PO TABS
ORAL_TABLET | ORAL | 0 refills | Status: DC
  Filled 2021-08-12: qty 90, 30d supply, fill #0

## 2021-08-15 ENCOUNTER — Other Ambulatory Visit (HOSPITAL_COMMUNITY): Payer: Self-pay

## 2021-08-16 ENCOUNTER — Other Ambulatory Visit (HOSPITAL_COMMUNITY): Payer: Self-pay

## 2021-09-13 ENCOUNTER — Other Ambulatory Visit: Payer: Self-pay | Admitting: Family Medicine

## 2021-09-13 ENCOUNTER — Other Ambulatory Visit (HOSPITAL_COMMUNITY): Payer: Self-pay

## 2021-09-13 DIAGNOSIS — M542 Cervicalgia: Secondary | ICD-10-CM | POA: Diagnosis not present

## 2021-09-13 DIAGNOSIS — M25562 Pain in left knee: Secondary | ICD-10-CM | POA: Diagnosis not present

## 2021-09-13 MED ORDER — METHYLPREDNISOLONE 4 MG PO TBPK
ORAL_TABLET | ORAL | 0 refills | Status: DC
  Filled 2021-09-13: qty 21, 6d supply, fill #0

## 2021-09-13 MED ORDER — HYDROCODONE-ACETAMINOPHEN 7.5-325 MG PO TABS
ORAL_TABLET | ORAL | 0 refills | Status: DC
  Filled 2021-09-13: qty 90, 30d supply, fill #0

## 2021-09-13 NOTE — Telephone Encounter (Signed)
Pt has a

## 2021-09-13 NOTE — Telephone Encounter (Signed)
Marcus Hayes with [facility] said pt is at their office and waiting on his hydrocodone to be refilled. FYI.

## 2021-09-13 NOTE — Telephone Encounter (Signed)
Pt needs a refill of pain meds

## 2021-09-26 DIAGNOSIS — M25562 Pain in left knee: Secondary | ICD-10-CM | POA: Diagnosis not present

## 2021-09-27 ENCOUNTER — Other Ambulatory Visit: Payer: Self-pay

## 2021-09-27 ENCOUNTER — Ambulatory Visit: Payer: 59 | Admitting: Family Medicine

## 2021-09-27 ENCOUNTER — Encounter: Payer: Self-pay | Admitting: Family Medicine

## 2021-09-27 ENCOUNTER — Other Ambulatory Visit (HOSPITAL_COMMUNITY): Payer: Self-pay

## 2021-09-27 VITALS — BP 138/80 | Temp 98.4°F | Wt 267.2 lb

## 2021-09-27 DIAGNOSIS — Z79891 Long term (current) use of opiate analgesic: Secondary | ICD-10-CM

## 2021-09-27 MED ORDER — HYDROCODONE-ACETAMINOPHEN 7.5-325 MG PO TABS
1.0000 | ORAL_TABLET | Freq: Three times a day (TID) | ORAL | 0 refills | Status: DC | PRN
  Filled 2021-09-27: qty 90, fill #0
  Filled 2021-10-19: qty 90, 30d supply, fill #0

## 2021-09-27 MED ORDER — HYDROCODONE-ACETAMINOPHEN 7.5-325 MG PO TABS
ORAL_TABLET | ORAL | 0 refills | Status: DC
  Filled 2021-09-27: qty 90, fill #0
  Filled 2021-11-18: qty 90, 30d supply, fill #0

## 2021-09-27 MED ORDER — HYDROCODONE-ACETAMINOPHEN 7.5-325 MG PO TABS
ORAL_TABLET | ORAL | 0 refills | Status: DC
  Filled 2021-09-27 – 2021-12-24 (×2): qty 90, 30d supply, fill #0

## 2021-09-27 MED ORDER — IBUPROFEN 600 MG PO TABS
600.0000 mg | ORAL_TABLET | Freq: Three times a day (TID) | ORAL | 3 refills | Status: DC | PRN
  Filled 2021-09-27: qty 60, 20d supply, fill #0

## 2021-09-27 NOTE — Progress Notes (Signed)
° °  Subjective:    Patient ID: Marcus Hayes, male    DOB: [birth year], 61 y.o.   MRN: [id number]  HPI This patient was seen today for chronic pain  The medication list was reviewed and updated.  Location of Pain for which the patient has been treated with regarding narcotics:   Onset of this pain:    -Compliance with medication: Hydrocodone 7.5-325 mg  - Number patient states they take daily: 3  -when was the last dose patient took? This morning  The patient was advised the importance of maintaining medication and not using illegal substances with these.  Here for refills and follow up  The patient was educated that we can provide 3 monthly scripts for their medication, it is their responsibility to follow the instructions.  Side effects or complications from medications: none  Patient is aware that pain medications are meant to minimize the severity of the pain to allow their pain levels to improve to allow for better function. They are aware of that pain medications cannot totally remove their pain.  Due for UDT ( at least once per year) : completed 01/2021  Scale of 1 to 10 ( 1 is least 10 is most) Your pain level without the medicine: 7 Your pain level with medication: 4  Scale 1 to 10 ( 1-helps very little, 10 helps very well) How well does your pain medication reduce your pain so you can function better through out the day? 10  Quality of the pain: Throbbing aching pain in his back now present in his knee as well Present daily Persistence of the pain:   Modifying factors: Worse with activity  Pt would like a shot in his knee.  We did discuss this he will get this through his orthopedist      Review of Systems     Objective:   Physical Exam General-in no acute distress Eyes-no discharge Lungs-respiratory rate normal, CTA CV-no murmurs,RRR Extremities skin warm dry no edema Neuro grossly normal Behavior normal, alert  Left knee pain       Assessment & Plan:  The patient was seen in followup for chronic pain. A review over at their current pain status was discussed. Drug registry was checked. Prescriptions were given.  Regular follow-up recommended. Discussion was held regarding the importance of compliance with medication as well as pain medication contract.  Patient was informed that medication may cause drowsiness and should not be combined  with other medications/alcohol or street drugs. If the patient feels medication is causing altered alertness then do not drive or operate dangerous equipment.  Should be noted that the patient appears to be meeting appropriate use of opioids and response.  Evidenced by improved function and decent pain control without significant side effects and no evidence of overt aberrancy issues.  Upon discussion with the patient today they understand that opioid therapy is optional and they feel that the pain has been refractory to reasonable conservative measures and is significant and affecting quality of life enough to warrant ongoing therapy and wishes to continue opioids.  Refills were provided.  No lab work today pain medicine does make his day go better.  Did not get proper relief with anti-inflammatories.  Continue current medicine

## 2021-09-28 ENCOUNTER — Other Ambulatory Visit (HOSPITAL_COMMUNITY): Payer: Self-pay

## 2021-09-30 ENCOUNTER — Telehealth: Payer: Self-pay | Admitting: Family Medicine

## 2021-09-30 DIAGNOSIS — M25562 Pain in left knee: Secondary | ICD-10-CM | POA: Diagnosis not present

## 2021-09-30 NOTE — Telephone Encounter (Signed)
Fax from [facility] for patient faxed over. Pt having knee surgery on 10/11/2021. Form in provider office. Please advise. Thank you.
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
## 2021-10-01 NOTE — Telephone Encounter (Signed)
We have received a notice from his orthopedic surgeon for left knee scope meniscus ectomy This is considered low risk From a overall health standpoint he would be able to go ahead with this I have filled out the form please fax it back to his orthopedic specialist thank you

## 2021-10-03 ENCOUNTER — Other Ambulatory Visit: Payer: Self-pay

## 2021-10-03 ENCOUNTER — Encounter (HOSPITAL_BASED_OUTPATIENT_CLINIC_OR_DEPARTMENT_OTHER): Payer: Self-pay | Admitting: Orthopedic Surgery

## 2021-10-07 NOTE — Progress Notes (Signed)

## 2021-10-11 ENCOUNTER — Other Ambulatory Visit: Payer: Self-pay

## 2021-10-11 ENCOUNTER — Encounter (HOSPITAL_BASED_OUTPATIENT_CLINIC_OR_DEPARTMENT_OTHER)
Admission: RE | Disposition: A | Discharge status: Home / Self Care | Payer: Self-pay | Source: Home / Self Care | Attending: Orthopedic Surgery

## 2021-10-11 ENCOUNTER — Ambulatory Visit (HOSPITAL_BASED_OUTPATIENT_CLINIC_OR_DEPARTMENT_OTHER)
Admission: RE | Admit: 2021-10-11 | Discharge: 2021-10-11 | Disposition: A | Discharge status: Home / Self Care | Payer: 59 | Attending: Orthopedic Surgery | Admitting: Orthopedic Surgery

## 2021-10-11 ENCOUNTER — Encounter (HOSPITAL_BASED_OUTPATIENT_CLINIC_OR_DEPARTMENT_OTHER): Payer: Self-pay | Admitting: Orthopedic Surgery

## 2021-10-11 ENCOUNTER — Ambulatory Visit (HOSPITAL_BASED_OUTPATIENT_CLINIC_OR_DEPARTMENT_OTHER): Payer: 59 | Admitting: Anesthesiology

## 2021-10-11 DIAGNOSIS — S83242A Other tear of medial meniscus, current injury, left knee, initial encounter: Secondary | ICD-10-CM | POA: Insufficient documentation

## 2021-10-11 DIAGNOSIS — X58XXXA Exposure to other specified factors, initial encounter: Secondary | ICD-10-CM | POA: Insufficient documentation

## 2021-10-11 DIAGNOSIS — K219 Gastro-esophageal reflux disease without esophagitis: Secondary | ICD-10-CM | POA: Diagnosis not present

## 2021-10-11 DIAGNOSIS — F17210 Nicotine dependence, cigarettes, uncomplicated: Secondary | ICD-10-CM | POA: Insufficient documentation

## 2021-10-11 DIAGNOSIS — Z9889 Other specified postprocedural states: Secondary | ICD-10-CM

## 2021-10-11 DIAGNOSIS — G8918 Other acute postprocedural pain: Secondary | ICD-10-CM | POA: Diagnosis not present

## 2021-10-11 HISTORY — PX: KNEE ARTHROSCOPY WITH MEDIAL MENISECTOMY: SHX5651

## 2021-10-11 SURGERY — ARTHROSCOPY, KNEE, WITH MEDIAL MENISCECTOMY
Anesthesia: General | Site: Knee | Laterality: Left

## 2021-10-11 MED ORDER — FENTANYL CITRATE (PF) 100 MCG/2ML IJ SOLN
25.0000 ug | INTRAMUSCULAR | Status: DC | PRN
  Administered 2021-10-11 (×2): 50 ug via INTRAVENOUS

## 2021-10-11 MED ORDER — TRAMADOL HCL 50 MG PO TABS: 50.0000 mg | ORAL_TABLET | Freq: Three times a day (TID) | ORAL | 0 refills | Status: DC | PRN

## 2021-10-11 MED ORDER — CELECOXIB 200 MG PO CAPS: 200.0000 mg | ORAL_CAPSULE | Freq: Two times a day (BID) | ORAL | 0 refills | Status: AC

## 2021-10-11 MED ORDER — CEFAZOLIN IN SODIUM CHLORIDE 3-0.9 GM/100ML-% IV SOLN
3.0000 g | INTRAVENOUS | Status: AC
  Administered 2021-10-11: 2 g via INTRAVENOUS

## 2021-10-11 MED ORDER — MIDAZOLAM HCL 2 MG/2ML IJ SOLN
INTRAMUSCULAR | Status: AC
  Filled 2021-10-11: qty 2

## 2021-10-11 MED ORDER — PHENYLEPHRINE 40 MCG/ML (10ML) SYRINGE FOR IV PUSH (FOR BLOOD PRESSURE SUPPORT)
PREFILLED_SYRINGE | INTRAVENOUS | Status: AC
Start: 2021-10-11 — End: ?
  Filled 2021-10-11: qty 10

## 2021-10-11 MED ORDER — PROPOFOL 10 MG/ML IV BOLUS
INTRAVENOUS | Status: AC
  Filled 2021-10-11: qty 20

## 2021-10-11 MED ORDER — BUPIVACAINE HCL (PF) 0.5 % IJ SOLN
INTRAMUSCULAR | Status: AC
  Filled 2021-10-11: qty 30

## 2021-10-11 MED ORDER — LIDOCAINE 2% (20 MG/ML) 5 ML SYRINGE
INTRAMUSCULAR | Status: AC
  Filled 2021-10-11: qty 5

## 2021-10-11 MED ORDER — BUPIVACAINE-EPINEPHRINE (PF) 0.5% -1:200000 IJ SOLN
INTRAMUSCULAR | Status: DC | PRN
  Administered 2021-10-11: 20 mL via PERINEURAL

## 2021-10-11 MED ORDER — FENTANYL CITRATE (PF) 100 MCG/2ML IJ SOLN
INTRAMUSCULAR | Status: AC
  Filled 2021-10-11: qty 2

## 2021-10-11 MED ORDER — FENTANYL CITRATE (PF) 100 MCG/2ML IJ SOLN
INTRAMUSCULAR | Status: DC | PRN
Start: 2021-10-11 — End: 2021-10-11
  Administered 2021-10-11 (×3): 50 ug via INTRAVENOUS

## 2021-10-11 MED ORDER — ASPIRIN EC 81 MG PO TBEC: 81.0000 mg | DELAYED_RELEASE_TABLET | Freq: Two times a day (BID) | ORAL | 0 refills | Status: DC

## 2021-10-11 MED ORDER — CELECOXIB 200 MG PO CAPS
200.0000 mg | ORAL_CAPSULE | Freq: Once | ORAL | Status: AC
  Administered 2021-10-11: 200 mg via ORAL

## 2021-10-11 MED ORDER — FENTANYL CITRATE (PF) 100 MCG/2ML IJ SOLN
INTRAMUSCULAR | Status: AC
Start: 2021-10-11 — End: ?
  Filled 2021-10-11: qty 2

## 2021-10-11 MED ORDER — CELECOXIB 200 MG PO CAPS
ORAL_CAPSULE | ORAL | Status: AC
  Filled 2021-10-11: qty 1

## 2021-10-11 MED ORDER — DEXAMETHASONE SODIUM PHOSPHATE 10 MG/ML IJ SOLN
8.0000 mg | Freq: Once | INTRAMUSCULAR | Status: AC
  Administered 2021-10-11: 8 mg via INTRAVENOUS

## 2021-10-11 MED ORDER — BUPIVACAINE HCL (PF) 0.25 % IJ SOLN
INTRAMUSCULAR | Status: AC
  Filled 2021-10-11: qty 30

## 2021-10-11 MED ORDER — DEXAMETHASONE SODIUM PHOSPHATE 10 MG/ML IJ SOLN
INTRAMUSCULAR | Status: AC
  Filled 2021-10-11: qty 1

## 2021-10-11 MED ORDER — ONDANSETRON HCL 4 MG/2ML IJ SOLN
INTRAMUSCULAR | Status: DC | PRN
  Administered 2021-10-11: 4 mg via INTRAVENOUS

## 2021-10-11 MED ORDER — MIDAZOLAM HCL 5 MG/5ML IJ SOLN
INTRAMUSCULAR | Status: DC | PRN
Start: 2021-10-11 — End: 2021-10-11
  Administered 2021-10-11: 2 mg via INTRAVENOUS

## 2021-10-11 MED ORDER — METHOCARBAMOL 1000 MG/10ML IJ SOLN
1000.0000 mg | Freq: Once | INTRAVENOUS | Status: DC
  Administered 2021-10-11: 500 mg via INTRAVENOUS
  Filled 2021-10-11: qty 1000

## 2021-10-11 MED ORDER — ATROPINE SULFATE 0.4 MG/ML IV SOLN
INTRAVENOUS | Status: AC
  Filled 2021-10-11: qty 1

## 2021-10-11 MED ORDER — FENTANYL CITRATE (PF) 100 MCG/2ML IJ SOLN
100.0000 ug | Freq: Once | INTRAMUSCULAR | Status: AC
  Administered 2021-10-11: 50 ug via INTRAVENOUS

## 2021-10-11 MED ORDER — PROPOFOL 10 MG/ML IV BOLUS
INTRAVENOUS | Status: DC | PRN
  Administered 2021-10-11: 200 mg via INTRAVENOUS

## 2021-10-11 MED ORDER — EPHEDRINE 5 MG/ML INJ
INTRAVENOUS | Status: AC
  Filled 2021-10-11: qty 5

## 2021-10-11 MED ORDER — METHYLPREDNISOLONE ACETATE 80 MG/ML IJ SUSP
INTRAMUSCULAR | Status: DC | PRN
  Administered 2021-10-11: 4 mL via INTRA_ARTICULAR

## 2021-10-11 MED ORDER — ACETAMINOPHEN 500 MG PO TABS
1000.0000 mg | ORAL_TABLET | Freq: Once | ORAL | Status: AC
  Administered 2021-10-11: 1000 mg via ORAL

## 2021-10-11 MED ORDER — METHYLPREDNISOLONE ACETATE 80 MG/ML IJ SUSP
INTRAMUSCULAR | Status: AC
  Filled 2021-10-11: qty 1

## 2021-10-11 MED ORDER — MIDAZOLAM HCL 2 MG/2ML IJ SOLN
2.0000 mg | Freq: Once | INTRAMUSCULAR | Status: AC
  Administered 2021-10-11: 2 mg via INTRAVENOUS

## 2021-10-11 MED ORDER — POVIDONE-IODINE 10 % EX SWAB
2.0000 "application " | Freq: Once | CUTANEOUS | Status: AC
  Administered 2021-10-11: 2 via TOPICAL

## 2021-10-11 MED ORDER — LIDOCAINE 2% (20 MG/ML) 5 ML SYRINGE
INTRAMUSCULAR | Status: DC | PRN
  Administered 2021-10-11: 60 mg via INTRAVENOUS

## 2021-10-11 MED ORDER — LACTATED RINGERS IV SOLN: INTRAVENOUS | Status: DC

## 2021-10-11 MED ORDER — SODIUM CHLORIDE 0.9 % IR SOLN
Status: DC | PRN
  Administered 2021-10-11: 3000 mL

## 2021-10-11 MED ORDER — SUCCINYLCHOLINE CHLORIDE 200 MG/10ML IV SOSY
PREFILLED_SYRINGE | INTRAVENOUS | Status: AC
  Filled 2021-10-11: qty 10

## 2021-10-11 MED ORDER — CEFAZOLIN IN SODIUM CHLORIDE 3-0.9 GM/100ML-% IV SOLN
INTRAVENOUS | Status: AC
Start: 2021-10-11 — End: ?
  Filled 2021-10-11: qty 100

## 2021-10-11 MED ORDER — METHOCARBAMOL 1000 MG/10ML IJ SOLN
500.0000 mg | Freq: Once | INTRAVENOUS | Status: DC
  Filled 2021-10-11: qty 5

## 2021-10-11 MED ORDER — ACETAMINOPHEN 500 MG PO TABS
ORAL_TABLET | ORAL | Status: AC
  Filled 2021-10-11: qty 2

## 2021-10-11 MED ORDER — SODIUM CHLORIDE 0.9 % IV BOLUS
500.0000 mL | Freq: Once | INTRAVENOUS | Status: AC
  Administered 2021-10-11: 500 mL via INTRAVENOUS

## 2021-10-11 MED ORDER — ONDANSETRON 4 MG PO TBDP: 4.0000 mg | ORAL_TABLET | Freq: Two times a day (BID) | ORAL | 0 refills | Status: DC | PRN

## 2021-10-11 SURGICAL SUPPLY — 36 items
APL PRP STRL LF DISP 70% ISPRP (MISCELLANEOUS) ×1
BNDG ELASTIC 6X5.8 VLCR STR LF (GAUZE/BANDAGES/DRESSINGS) ×4 IMPLANT
CHLORAPREP W/TINT 26 (MISCELLANEOUS) ×4 IMPLANT
CUFF TOURN SGL QUICK 34 (TOURNIQUET CUFF) ×3
CUFF TRNQT CYL 34X4.125X (TOURNIQUET CUFF) ×2 IMPLANT
DISSECTOR  3.8MM X 13CM (MISCELLANEOUS) ×3
DISSECTOR 3.8MM X 13CM (MISCELLANEOUS) ×2 IMPLANT
DISSECTOR 4.0MM X 13CM (MISCELLANEOUS) IMPLANT
DRAPE ARTHROSCOPY W/POUCH 90 (DRAPES) ×4 IMPLANT
DRAPE IMP U-DRAPE 54X76 (DRAPES) ×4 IMPLANT
DRAPE U-SHAPE 47X51 STRL (DRAPES) ×4 IMPLANT
DRSG EMULSION OIL 3X3 NADH (GAUZE/BANDAGES/DRESSINGS) ×4 IMPLANT
EXCALIBUR 3.8MM X 13CM (MISCELLANEOUS) ×2 IMPLANT
GAUZE SPONGE 4X4 12PLY STRL (GAUZE/BANDAGES/DRESSINGS) ×4 IMPLANT
GLOVE SRG 8 PF TXTR STRL LF DI (GLOVE) ×2 IMPLANT
GLOVE SURG ENC MOIS LTX SZ7.5 (GLOVE) ×8 IMPLANT
GLOVE SURG POLYISO LF SZ7 (GLOVE) ×2 IMPLANT
GLOVE SURG UNDER POLY LF SZ6.5 (GLOVE) ×2 IMPLANT
GLOVE SURG UNDER POLY LF SZ7 (GLOVE) ×2 IMPLANT
GLOVE SURG UNDER POLY LF SZ7.5 (GLOVE) ×8 IMPLANT
GLOVE SURG UNDER POLY LF SZ8 (GLOVE) ×3
GLOVE SURG UNDER POLY LF SZ8.5 (GLOVE) IMPLANT
GOWN STRL REUS W/ TWL LRG LVL3 (GOWN DISPOSABLE) ×6 IMPLANT
GOWN STRL REUS W/ TWL XL LVL3 (GOWN DISPOSABLE) ×2 IMPLANT
GOWN STRL REUS W/TWL LRG LVL3 (GOWN DISPOSABLE) ×9
GOWN STRL REUS W/TWL XL LVL3 (GOWN DISPOSABLE) ×3
MANIFOLD NEPTUNE II (INSTRUMENTS) ×4 IMPLANT
PACK ARTHROSCOPY DSU (CUSTOM PROCEDURE TRAY) ×4 IMPLANT
PACK BASIN DAY SURGERY FS (CUSTOM PROCEDURE TRAY) ×4 IMPLANT
PORT APPOLLO RF 90DEGREE MULTI (SURGICAL WAND) IMPLANT
SUT ETHILON 3 0 PS 1 (SUTURE) ×4 IMPLANT
TAPE CLOTH 3X10 TAN LF (GAUZE/BANDAGES/DRESSINGS) IMPLANT
TOWEL GREEN STERILE FF (TOWEL DISPOSABLE) ×4 IMPLANT
TUBING ARTHROSCOPY IRRIG 16FT (MISCELLANEOUS) ×4 IMPLANT
WATER STERILE IRR 1000ML POUR (IV SOLUTION) ×4 IMPLANT
WRAP KNEE MAXI GEL POST OP (GAUZE/BANDAGES/DRESSINGS) ×4 IMPLANT

## 2021-10-11 NOTE — Transfer of Care (Signed)
Immediate Anesthesia Transfer of Care Note ? ?Patient: Marcus Hayes ? ?Procedure(s) Performed: KNEE ARTHROSCOPY WITH MEDIAL MENISECTOMY AND CHONDROPLASTY (Left: Knee) ? ?Patient Location: PACU ? ?Anesthesia Type:GA combined with regional for post-op pain ? ?Level of Consciousness: awake and alert  ? ?Airway & Oxygen Therapy: Patient Spontanous Breathing and Patient connected to face mask oxygen ? ?Post-op Assessment: Report given to RN and Post -op Vital signs reviewed and stable ? ?Post vital signs: Reviewed and stable  ? ? ?Last Vitals:  ?Vitals Value Taken Time  ?BP    ?Temp    ?Pulse    ?Resp    ?SpO2    ? ? ?Last Pain:  ?Vitals:  ? 10/11/21 1102  ?TempSrc: Oral  ?PainSc: 0-No pain  ?   ? ?Patients Stated Pain Goal: 4 (10/11/21 1102) ? ?Complications: No notable events documented. ?

## 2021-10-11 NOTE — Anesthesia Preprocedure Evaluation (Addendum)
Anesthesia Evaluation  ?Patient identified by MRN, date of birth, ID band ?Patient awake ? ? ? ?Reviewed: ?Allergy & Precautions, H&P , NPO status , Patient's Chart, lab work & pertinent test results ? ?Airway ?Mallampati: III ? ?TM Distance: >3 FB ?Neck ROM: Full ? ? ? Dental ?no notable dental hx. ?(+) Teeth Intact, Dental Advisory Given ?  ?Pulmonary ?Current Smoker and Patient abstained from smoking.,  ?  ?Pulmonary exam normal ?breath sounds clear to auscultation ? ? ? ? ? ? Cardiovascular ?negative cardio ROS ? ? ?Rhythm:Regular Rate:Normal ? ? ?  ?Neuro/Psych ?negative neurological ROS ? negative psych ROS  ? GI/Hepatic ?Neg liver ROS, GERD  Medicated,  ?Endo/Other  ?Morbid obesity ? Renal/GU ?negative Renal ROS  ?negative genitourinary ?  ?Musculoskeletal ? ?(+) Arthritis , Osteoarthritis,   ? Abdominal ?  ?Peds ? Hematology ?negative hematology ROS ?(+)   ?Anesthesia Other Findings ? ? Reproductive/Obstetrics ?negative OB ROS ? ?  ? ? ? ? ? ? ? ? ? ? ? ? ? ?  ?  ? ? ? ? ? ? ? ?Anesthesia Physical ?Anesthesia Plan ? ?ASA: 2 ? ?Anesthesia Plan: General  ? ?Post-op Pain Management: Tylenol PO (pre-op)* and Celebrex PO (pre-op)*  ? ?Induction: Intravenous ? ?PONV Risk Score and Plan: 2 and Ondansetron, Dexamethasone and Midazolam ? ?Airway Management Planned: LMA ? ?Additional Equipment:  ? ?Intra-op Plan:  ? ?Post-operative Plan: Extubation in OR ? ?Informed Consent: I have reviewed the patients History and Physical, chart, labs and discussed the procedure including the risks, benefits and alternatives for the proposed anesthesia with the patient or authorized representative who has indicated his/her understanding and acceptance.  ? ? ? ?Dental advisory given ? ?Plan Discussed with: CRNA ? ?Anesthesia Plan Comments:   ? ? ? ? ? ? ?Anesthesia Quick Evaluation ? ?

## 2021-10-11 NOTE — Anesthesia Postprocedure Evaluation (Signed)
Anesthesia Post Note ? ?Patient: Marcus Hayes ? ?Procedure(s) Performed: KNEE ARTHROSCOPY WITH MEDIAL MENISECTOMY AND CHONDROPLASTY (Left: Knee) ? ?  ? ?Patient location during evaluation: PACU ?Anesthesia Type: General and Regional ?Level of consciousness: awake and alert ?Pain management: pain level controlled ?Vital Signs Assessment: post-procedure vital signs reviewed and stable ?Respiratory status: spontaneous breathing, nonlabored ventilation and respiratory function stable ?Cardiovascular status: blood pressure returned to baseline and stable ?Postop Assessment: no apparent nausea or vomiting ?Anesthetic complications: no ? ? ?No notable events documented. ? ?Last Vitals:  ?Vitals:  ? 10/11/21 1456 10/11/21 1506  ?BP: (!) 147/96   ?Pulse: 76 81  ?Resp: 16 16  ?Temp:  36.4 ?C  ?SpO2: 96%   ?  ?Last Pain:  ?Vitals:  ? 10/11/21 1506  ?TempSrc: Oral  ?PainSc: 4   ? ? ?  ?  ?  ?  ?  ?  ? ?Vallery Mcdade,W. EDMOND ? ? ? ? ?

## 2021-10-11 NOTE — Anesthesia Procedure Notes (Signed)
Procedure Name: LMA Insertion ?Date/Time: 10/11/2021 12:50 PM ?Performed by: Ronnette Hila, CRNA ?Pre-anesthesia Checklist: Patient identified, Emergency Drugs available, Suction available and Patient being monitored ?Patient Re-evaluated:Patient Re-evaluated prior to induction ?Oxygen Delivery Method: Circle system utilized ?Preoxygenation: Pre-oxygenation with 100% oxygen ?Induction Type: IV induction ?Ventilation: Mask ventilation without difficulty ?LMA: LMA inserted ?LMA Size: 5.0 ?Number of attempts: 1 ?Airway Equipment and Method: Bite block ?Placement Confirmation: positive ETCO2 ?Tube secured with: Tape ?Dental Injury: Teeth and Oropharynx as per pre-operative assessment  ? ? ? ? ?

## 2021-10-11 NOTE — Discharge Instructions (Addendum)
Post Anesthesia Home Care Instructions ? ?Activity: ?Get plenty of rest for the remainder of the day. A responsible individual must stay with you for 24 hours following the procedure.  ?For the next 24 hours, DO NOT: ?-Drive a car ?-Paediatric nurse ?-Drink alcoholic beverages ?-Take any medication unless instructed by your physician ?-Make any legal decisions or sign important papers. ? ?Meals: ?Start with liquid foods such as gelatin or soup. Progress to regular foods as tolerated. Avoid greasy, spicy, heavy foods. If nausea and/or vomiting occur, drink only clear liquids until the nausea and/or vomiting subsides. Call your physician if vomiting continues. ? ?Special Instructions/Symptoms: ?Your throat may feel dry or sore from the anesthesia or the breathing tube placed in your throat during surgery. If this causes discomfort, gargle with warm salt water. The discomfort should disappear within 24 hours. ? ?If you had a scopolamine patch placed behind your ear for the management of post- operative nausea and/or vomiting: ? ?1. The medication in the patch is effective for 72 hours, after which it should be removed.  Wrap patch in a tissue and discard in the trash. Wash hands thoroughly with soap and water. ?2. You may remove the patch earlier than 72 hours if you experience unpleasant side effects which may include dry mouth, dizziness or visual disturbances. ?3. Avoid touching the patch. Wash your hands with soap and water after contact with the patch. ?    ? ? ?Regional Anesthesia Blocks ? ?1. Numbness or the inability to move the "blocked" extremity may last from 3-48 hours after placement. The length of time depends on the medication injected and your individual response to the medication. If the numbness is not going away after 48 hours, call your surgeon. ? ?2. The extremity that is blocked will need to be protected until the numbness is gone and the  Strength has returned. Because you cannot feel it, you  will need to take extra care to avoid injury. Because it may be weak, you may have difficulty moving it or using it. You may not know what position it is in without looking at it while the block is in effect. ? ?3. For blocks in the legs and feet, returning to weight bearing and walking needs to be done carefully. You will need to wait until the numbness is entirely gone and the strength has returned. You should be able to move your leg and foot normally before you try and bear weight or walk. You will need someone to be with you when you first try to ensure you do not fall and possibly risk injury. ? ?4. Bruising and tenderness at the needle site are common side effects and will resolve in a few days. ? ?5. Persistent numbness or new problems with movement should be communicated to the surgeon or the [facility] [phone] Jess[place] [phone]).  ? ? ?Next dose of Tylenol can be given at 5:15pm if needed. ?Next dose of NSAID (Ibuprofen/Motrin/Aleve) can be given at 7:15pm if needed. ? ? ?POST-OPERATIVE OPIOID TAPER INSTRUCTIONS: ?It is important to wean off of your opioid medication as soon as possible. If you do not need pain medication after your surgery it is ok to stop day one. ?Opioids include: ?Codeine, Hydrocodone(Norco, Vicodin), Oxycodone(Percocet, oxycontin) and hydromorphone amongst others.  ?Long term and even short term use of opiods can cause: ?Increased pain response ?Dependence ?Constipation ?Depression ?Respiratory depression ?And more.  ?Withdrawal symptoms can include ?Flu like symptoms ?Nausea, vomiting ?  And more ?Techniques to manage these symptoms ?Hydrate well ?Eat regular healthy meals ?Stay active ?Use relaxation techniques(deep breathing, meditating, yoga) ?Do Not substitute Alcohol to help with tapering ?If you have been on opioids for less than two weeks and do not have pain than it is ok to stop all together.  ?Plan to wean off of opioids ?This plan  should start within one week post op of your joint replacement. ?Maintain the same interval or time between taking each dose and first decrease the dose.  ?Cut the total daily intake of opioids by one tablet each day ?Next start to increase the time between doses. ?The last dose that should be eliminated is the evening dose.   ?

## 2021-10-11 NOTE — Progress Notes (Signed)
Assisted Dr. Edmond Fitzgerald with left, ultrasound guided, adductor canal block. Side rails up, monitors on throughout procedure. See vital signs in flow sheet. Tolerated Procedure well. 

## 2021-10-11 NOTE — Interval H&P Note (Signed)
History and Physical Interval Note: ? ?10/11/2021 ?12:33 PM ? ?Marcus Hayes  has presented today for surgery, with the diagnosis of LEFT KNEE MEDIAL MENISCUS TEAR.  The various methods of treatment have been discussed with the patient and family. After consideration of risks, benefits and other options for treatment, the patient has consented to  Procedure(s): ?KNEE ARTHROSCOPY WITH MEDIAL MENISECTOMY (Left) as a surgical intervention.  The patient's history has been reviewed, patient examined, no change in status, stable for surgery.  I have reviewed the patient's chart and labs.  Questions were answered to the patient's satisfaction.   ? ? ?Renette Butters ? ? ?

## 2021-10-11 NOTE — Anesthesia Procedure Notes (Signed)
Anesthesia Regional Block: Adductor canal block  ? ?Pre-Anesthetic Checklist: , timeout performed,  Correct Patient, Correct Site, Correct Laterality,  Correct Procedure, Correct Position, site marked,  Risks and benefits discussed,  Pre-op evaluation,  At surgeon's request and post-op pain management ? ?Laterality: Left ? ?Prep: Maximum Sterile Barrier Precautions used, chloraprep     ?  ?Needles:  ?Injection technique: Single-shot ? ?Needle Type: Echogenic Stimulator Needle   ? ? ?Needle Length: 9cm  ?Needle Gauge: 21  ? ? ? ?Additional Needles: ? ? ?Procedures:,,,, ultrasound used (permanent image in chart),,    ?Narrative:  ?Start time: 10/11/2021 11:31 AM ?End time: 10/11/2021 11:41 AM ?Injection made incrementally with aspirations every 5 mL. ? ?Performed by: Personally  ?Anesthesiologist: Gaynelle Adu, MD ? ? ? ? ?

## 2021-10-11 NOTE — Op Note (Signed)
10/11/2021 ? ?1:12 PM ? ?PATIENT:  Marcus Hayes   ? ?PRE-OPERATIVE DIAGNOSIS:  LEFT KNEE MEDIAL MENISCUS TEAR ? ?POST-OPERATIVE DIAGNOSIS:  Same ? ?PROCEDURE:  KNEE ARTHROSCOPY WITH MEDIAL MENISECTOMY AND CHONDROPLASTY ? ?SURGEON:  Renette Butters, MD ? ?ASSISTANT: Aggie Moats, PA-C, he was present and scrubbed throughout the case, critical for completion in a timely fashion, and for retraction, instrumentation, and closure. ? ? ?ANESTHESIA:   General ? ?BLOOD LOSS: min ? ?COMPLICATIONS: None ? ? ?PREOPERATIVE INDICATIONS:  Marcus Hayes is a  61 y.o. male with a diagnosis of [place] who failed conservative measures and elected for surgical management.   ? ?The risks benefits and alternatives were discussed with the patient preoperatively including but not limited to the risks of infection, bleeding, nerve injury, cardiopulmonary complications, the need for revision surgery, among others, and the patient was willing to proceed. ? ?OPERATIVE IMPLANTS: none ? ?OPERATIVE FINDINGS: ?Examination under anesthesia: stable ?Diagnostic Arthroscopy:  ?articular cartilage:PF grade 1 ?Medial meniscus:radial tear ?Lateral meniscus:stable ?Anterior cruciate ligament/PCL: stable ?Loose bodies: none ? ? ? ?OPERATIVE PROCEDURE:  Patient was identified in the preoperative holding area and site was marked by me male was transported to the operating theater and placed on the table in supine position taking care to pad all bony prominences. After a preincinduction time out anesthesia was induced.  male received ancef for preoperative antibiotics. The left lower extremity was prepped and draped in normal sterile fashion and a pre-incision timeout was performed.  ? ?A small stab incision was made in the anterolateral portal position. The arthroscope was introduced in the joint. A medial portal was then established under direct visualization just above the anterior horn of the medial meniscus. Diagnostic  arthroscopy was then carried out with findings as described above. ? ?I debrdied the medial meniscal tear with a shaver ? ?I performed a chondroplasty at the PF space ? ?The arthroscopic equipment was removed from the joint and the portals were closed with 3-0 nylon in an interrupted fashion. The knee was infiltrated with depomedrol.  Sterile dressings were then applied including Xeroform 4 x 4's ABDs an ACE bandage.  The patient was then allowed to awaken from general anesthesia, transferred to the stretcher and taken to the recovery room in stable condition. ? ?POSTOPERATIVE PLAN: The patient will be discharged home today and will followup in one week for suture removal and wound check.  ?VTE prophylaxis: mobilize and chemical px ? ? ? ? ?

## 2021-10-14 ENCOUNTER — Encounter (HOSPITAL_BASED_OUTPATIENT_CLINIC_OR_DEPARTMENT_OTHER): Payer: Self-pay | Admitting: Orthopedic Surgery

## 2021-10-19 ENCOUNTER — Other Ambulatory Visit (HOSPITAL_COMMUNITY): Payer: Self-pay

## 2021-10-21 ENCOUNTER — Other Ambulatory Visit (HOSPITAL_COMMUNITY): Payer: Self-pay

## 2021-10-21 MED ORDER — TRAMADOL HCL 50 MG PO TABS
50.0000 mg | ORAL_TABLET | Freq: Two times a day (BID) | ORAL | 0 refills | Status: DC | PRN
  Filled 2021-10-21: qty 14, 7d supply, fill #0

## 2021-10-21 MED ORDER — METHYLPREDNISOLONE 4 MG PO TBPK
ORAL_TABLET | ORAL | 0 refills | Status: DC
  Filled 2021-10-21: qty 21, 6d supply, fill #0

## 2021-10-24 ENCOUNTER — Ambulatory Visit (HOSPITAL_COMMUNITY): Payer: 59 | Attending: Orthopedic Surgery | Admitting: Physical Therapy

## 2021-10-24 ENCOUNTER — Other Ambulatory Visit: Payer: Self-pay

## 2021-10-24 ENCOUNTER — Encounter (HOSPITAL_COMMUNITY): Payer: Self-pay | Admitting: Physical Therapy

## 2021-10-24 DIAGNOSIS — R29898 Other symptoms and signs involving the musculoskeletal system: Secondary | ICD-10-CM | POA: Diagnosis not present

## 2021-10-24 DIAGNOSIS — Z789 Other specified health status: Secondary | ICD-10-CM | POA: Diagnosis not present

## 2021-10-24 DIAGNOSIS — R262 Difficulty in walking, not elsewhere classified: Secondary | ICD-10-CM

## 2021-10-24 NOTE — Patient Instructions (Signed)
?  Access Code: [email or web address] ?URL: [email or web address] ?Date: 10/24/2021 ?Prepared by: Creig Hines ? ?Exercises ?- Heel Raises with Counter Support  - 1-2 x daily - 7 x weekly - 3 sets - 12 reps ?- Ankle Inversion Eversion Towel Slide  - 1-2 x daily - 7 x weekly - 2 sets - 14 reps ?- Supine Straight Leg Raises  - 1-2 x daily - 7 x weekly - 3 sets - 8 reps ?- Sidelying Hip Abduction  - 1-2 x daily - 7 x weekly - 3 sets - 8 reps ?- Seated Knee Extension AROM  - 1-2 x daily - 7 x weekly - 3 sets - 10 reps - 2s hold ?- Standing Knee Flexion  - 1-2 x daily - 7 x weekly - 3 sets - 8 reps ? ?
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
## 2021-10-30 ENCOUNTER — Ambulatory Visit (HOSPITAL_COMMUNITY): Payer: 59 | Admitting: Physical Therapy

## 2021-10-31 ENCOUNTER — Encounter (HOSPITAL_COMMUNITY): Payer: Self-pay | Admitting: Physical Therapy

## 2021-10-31 ENCOUNTER — Ambulatory Visit (HOSPITAL_COMMUNITY): Payer: 59 | Admitting: Physical Therapy

## 2021-10-31 DIAGNOSIS — Z789 Other specified health status: Secondary | ICD-10-CM

## 2021-10-31 DIAGNOSIS — R29898 Other symptoms and signs involving the musculoskeletal system: Secondary | ICD-10-CM

## 2021-10-31 DIAGNOSIS — R262 Difficulty in walking, not elsewhere classified: Secondary | ICD-10-CM
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
## 2021-11-04 ENCOUNTER — Ambulatory Visit (HOSPITAL_COMMUNITY): Payer: 59 | Attending: Orthopedic Surgery | Admitting: Physical Therapy

## 2021-11-04 ENCOUNTER — Encounter (HOSPITAL_COMMUNITY): Payer: Self-pay | Admitting: Physical Therapy

## 2021-11-04 DIAGNOSIS — R262 Difficulty in walking, not elsewhere classified: Secondary | ICD-10-CM | POA: Diagnosis not present

## 2021-11-04 DIAGNOSIS — R29898 Other symptoms and signs involving the musculoskeletal system: Secondary | ICD-10-CM | POA: Insufficient documentation

## 2021-11-04 DIAGNOSIS — Z789 Other specified health status: Secondary | ICD-10-CM | POA: Insufficient documentation
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
## 2021-11-06 ENCOUNTER — Ambulatory Visit (HOSPITAL_COMMUNITY): Payer: 59 | Admitting: Physical Therapy

## 2021-11-06 ENCOUNTER — Encounter (HOSPITAL_COMMUNITY): Payer: Self-pay | Admitting: Physical Therapy

## 2021-11-06 DIAGNOSIS — R29898 Other symptoms and signs involving the musculoskeletal system: Secondary | ICD-10-CM

## 2021-11-06 DIAGNOSIS — Z789 Other specified health status: Secondary | ICD-10-CM | POA: Diagnosis not present

## 2021-11-06 DIAGNOSIS — R262 Difficulty in walking, not elsewhere classified: Secondary | ICD-10-CM | POA: Diagnosis not present
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
## 2021-11-12 ENCOUNTER — Encounter (HOSPITAL_COMMUNITY): Payer: 59

## 2021-11-14 ENCOUNTER — Encounter (HOSPITAL_COMMUNITY): Payer: 59 | Admitting: Physical Therapy

## 2021-11-18 ENCOUNTER — Other Ambulatory Visit (HOSPITAL_COMMUNITY): Payer: Self-pay

## 2021-11-18 DIAGNOSIS — S83242D Other tear of medial meniscus, current injury, left knee, subsequent encounter: Secondary | ICD-10-CM | POA: Diagnosis not present

## 2021-11-18 MED ORDER — LIDOCAINE 5 % EX PTCH
MEDICATED_PATCH | CUTANEOUS | 0 refills | Status: DC
  Filled 2021-11-18: qty 15, 15d supply, fill #0

## 2021-12-03 DIAGNOSIS — M25562 Pain in left knee: Secondary | ICD-10-CM | POA: Diagnosis not present

## 2021-12-06 ENCOUNTER — Other Ambulatory Visit (HOSPITAL_COMMUNITY): Payer: Self-pay

## 2021-12-06 MED ORDER — LIDOCAINE 4 % EX PTCH: MEDICATED_PATCH | CUTANEOUS | 2 refills | Status: DC

## 2021-12-13 ENCOUNTER — Other Ambulatory Visit: Payer: Self-pay | Admitting: Family Medicine

## 2021-12-13 ENCOUNTER — Other Ambulatory Visit (HOSPITAL_COMMUNITY): Payer: Self-pay

## 2021-12-13 MED ORDER — PANTOPRAZOLE SODIUM 40 MG PO TBEC
DELAYED_RELEASE_TABLET | Freq: Two times a day (BID) | ORAL | 1 refills | Status: DC
  Filled 2021-12-13: qty 180, 90d supply, fill #0
  Filled 2022-03-14: qty 180, 90d supply, fill #1

## 2021-12-13 MED ORDER — LIDOCAINE 5 % EX PTCH
MEDICATED_PATCH | CUTANEOUS | 0 refills | Status: DC
  Filled 2021-12-13: qty 30, 30d supply, fill #0

## 2021-12-14 ENCOUNTER — Other Ambulatory Visit (HOSPITAL_COMMUNITY): Payer: Self-pay

## 2021-12-24 ENCOUNTER — Other Ambulatory Visit: Payer: Self-pay | Admitting: Family Medicine

## 2021-12-24 ENCOUNTER — Other Ambulatory Visit (HOSPITAL_COMMUNITY): Payer: Self-pay

## 2021-12-24 MED ORDER — ALLOPURINOL 300 MG PO TABS
ORAL_TABLET | ORAL | 1 refills | Status: DC
  Filled 2021-12-24: qty 90, fill #0
  Filled 2022-03-05: qty 90, 90d supply, fill #0
  Filled 2022-05-26: qty 90, 90d supply, fill #1

## 2021-12-25 ENCOUNTER — Other Ambulatory Visit (HOSPITAL_COMMUNITY): Payer: Self-pay

## 2021-12-31 ENCOUNTER — Ambulatory Visit: Payer: 59 | Admitting: Family Medicine

## 2021-12-31 ENCOUNTER — Ambulatory Visit (HOSPITAL_COMMUNITY)
Admission: RE | Admit: 2021-12-31 | Discharge: 2021-12-31 | Disposition: A | Discharge status: Home / Self Care | Payer: 59 | Source: Ambulatory Visit | Attending: Family Medicine | Admitting: Family Medicine

## 2021-12-31 VITALS — BP 157/103 | HR 107 | Temp 98.4°F | Ht 70.0 in | Wt 268.2 lb

## 2021-12-31 DIAGNOSIS — M542 Cervicalgia: Secondary | ICD-10-CM | POA: Diagnosis not present

## 2021-12-31 DIAGNOSIS — M5137 Other intervertebral disc degeneration, lumbosacral region: Secondary | ICD-10-CM | POA: Diagnosis not present

## 2021-12-31 DIAGNOSIS — M545 Low back pain, unspecified: Secondary | ICD-10-CM

## 2021-12-31 DIAGNOSIS — M50322 Other cervical disc degeneration at C5-C6 level: Secondary | ICD-10-CM | POA: Diagnosis not present

## 2021-12-31 DIAGNOSIS — Z79891 Long term (current) use of opiate analgesic: Secondary | ICD-10-CM

## 2021-12-31 DIAGNOSIS — G542 Cervical root disorders, not elsewhere classified: Secondary | ICD-10-CM | POA: Diagnosis not present

## 2021-12-31 DIAGNOSIS — R222 Localized swelling, mass and lump, trunk: Secondary | ICD-10-CM | POA: Diagnosis not present

## 2021-12-31 DIAGNOSIS — M50323 Other cervical disc degeneration at C6-C7 level: Secondary | ICD-10-CM | POA: Diagnosis not present

## 2021-12-31 MED ORDER — HYDROCODONE-ACETAMINOPHEN 10-325 MG PO TABS
1.0000 | ORAL_TABLET | Freq: Four times a day (QID) | ORAL | 0 refills | Status: DC | PRN
  Filled 2021-12-31: qty 30, fill #0
  Filled 2022-02-12 – 2022-02-15 (×2): qty 30, 8d supply, fill #0

## 2021-12-31 MED ORDER — HYDROCODONE-ACETAMINOPHEN 10-325 MG PO TABS
1.0000 | ORAL_TABLET | Freq: Four times a day (QID) | ORAL | 0 refills | Status: DC | PRN
  Filled 2021-12-31: qty 120, fill #0
  Filled 2022-01-16: qty 120, 30d supply, fill #0

## 2021-12-31 MED ORDER — HYDROCODONE-ACETAMINOPHEN 10-325 MG PO TABS
1.0000 | ORAL_TABLET | Freq: Four times a day (QID) | ORAL | 0 refills | Status: DC | PRN
  Filled 2021-12-31: qty 30, fill #0
  Filled 2022-02-15: qty 30, 8d supply, fill #0

## 2021-12-31 NOTE — Patient Instructions (Signed)
For now you may utilize your current pain medicine 1 tablet 4 times per day  The stronger dose will be available to you on June 19th Hydrocodone 10 mg / 325 mg 1 taken 4 times daily  Please do your plain x-rays  We will schedule the MRIs  We will work on getting you an appointment with Dr. Danielle Dess  Follow-up in July thanks-Dr. Lorin Picket

## 2021-12-31 NOTE — Progress Notes (Signed)
Subjective:    Patient ID: Marcus Hayes, male    DOB: [birth year], 61 y.o.   MRN: [id number]  HPI  Patient here for 3 month pain management follow up This patient was seen today for chronic pain Larey Seat earlier this year Tore meniscus Saw ortho had surgery Started PT Having trouble Had another MRI  Now with shoulder neck pain been going on for  Shoulder for 4 months Neck since the fall Hurts to look up Turning to the left caiuses poain Moving ar fair Numbenss and tingling was going on before the fall but pain in neck since fall  Lower back severe since the fall Jarring causes pain  Very restricted in what he does Unable to do activity to take care of himself  Larey Seat down ramp At his porch Afterwards left knee pain And that evening and the next day noticed increase pain in knee And over the next few days neck and back pain  Out from work since march 10-knee surgery  The medication list was reviewed and updated.  Location of Pain for which the patient has been treated with regarding narcotics: Cervical spine left knee as well as lower back  Onset of this pain: Lower back has been present for years cervical spine to some degree present but worse since the fall He also relates increased severity of his back pain since fall that occurred when he injured his knee   -Compliance with medication: Relates good compliance  - Number patient states they take daily: 3 daily but he states it does not really help him much at all  -when was the last dose patient took?  Recently today  The patient was advised the importance of maintaining medication and not using illegal substances with these.  Here for refills and follow up  The patient was educated that we can provide 3 monthly scripts for their medication, it is their responsibility to follow the instructions.  Side effects or complications from medications: Denies side effects  Patient is aware that pain medications are  meant to minimize the severity of the pain to allow their pain levels to improve to allow for better function. They are aware of that pain medications cannot totally remove their pain.  Due for UDT ( at least once per year) : He is due on his next visit  Scale of 1 to 10 ( 1 is least 10 is most) Your pain level without the medicine: 8 Your pain level with medication7  Scale 1 to 10 ( 1-helps very little, 10 helps very well) How well does your pain medication reduce your pain so you can function better through out the day? 5  Quality of the pain: Aching throbbing burning pain especially in the neck throbbing pain in the lower back stabbing pain in the knee  Persistence of the pain: Present all the time  Modifying factors: Worse with activity      Review of Systems     Objective:   Physical Exam General-in no acute distress Eyes-no discharge Lungs-respiratory rate normal, CTA CV-no murmurs,RRR Extremities skin warm dry no edema Neuro grossly normal Behavior normal, alert Patient with significant pain in the cervical spine with limited range of motion with rotating to the left tilting to the left difficult time with the trapezius and pain and tingling going down the left arm Low back pain and discomfort with increased pain with extension and flexion of rotation difficult time standing then sitting Strength in legs are good  Assessment & Plan:  1. Encounter for long-term opiate analgesic use The patient was seen in followup for chronic pain. A review over at their current pain status was discussed. Drug registry was checked. Prescriptions were given.  Regular follow-up recommended. Discussion was held regarding the importance of compliance with medication as well as pain medication contract.  Patient was informed that medication may cause drowsiness and should not be combined  with other medications/alcohol or street drugs. If the patient feels medication is causing  altered alertness then do not drive or operate dangerous equipment.  Should be noted that the patient appears to be meeting appropriate use of opioids and response.  Evidenced by improved function and decent pain control without significant side effects and no evidence of overt aberrancy issues.  Upon discussion with the patient today they understand that opioid therapy is optional and they feel that the pain has been refractory to reasonable conservative measures and is significant and affecting quality of life enough to warrant ongoing therapy and wishes to continue opioids.  Refills were provided.  Current pain medicine not doing enough Will change dose to 10 mg / 325 mg of hydrocodone 1.  120 1 taken 4 times daily   2. Cervical pain (neck) Range of motion exercises cervical spine x-rays more than likely will need to have MRI as well - DG Cervical Spine Complete - Ambulatory referral to Neurosurgery  3. Lumbar pain Plain x-rays ordered.  Will need to have MRI as well - DG Lumbar Spine Complete - Ambulatory referral to Neurosurgery  4. Cervical nerve root impingement MRI as well Patient not able to work currently He is currently disabled with this Hopefully his visit with neurosurgery will go well  - Ambulatory referral to Neurosurgery Should be noted that we did increase the dose of his pain medicine as stated above patient to do follow-up visit within 3 months currently scheduled for 6 weeks from now

## 2022-01-01 ENCOUNTER — Other Ambulatory Visit (HOSPITAL_COMMUNITY): Payer: Self-pay

## 2022-01-01 NOTE — Addendum Note (Signed)
Addended by: Margaretha Sheffield on: 01/01/2022 08:46 AM   Modules accepted: Orders

## 2022-01-03 ENCOUNTER — Other Ambulatory Visit (HOSPITAL_COMMUNITY): Payer: Self-pay

## 2022-01-03 DIAGNOSIS — S83242D Other tear of medial meniscus, current injury, left knee, subsequent encounter: Secondary | ICD-10-CM | POA: Diagnosis not present

## 2022-01-03 MED ORDER — MELOXICAM 15 MG PO TABS
ORAL_TABLET | ORAL | 1 refills | Status: DC
  Filled 2022-01-03: qty 30, 30d supply, fill #0
  Filled 2022-02-12: qty 30, 30d supply, fill #1

## 2022-01-03 MED ORDER — LIDOCAINE 5 % EX PTCH
MEDICATED_PATCH | CUTANEOUS | 0 refills | Status: DC
  Filled 2022-01-03: qty 30, 30d supply, fill #0

## 2022-01-06 ENCOUNTER — Other Ambulatory Visit (HOSPITAL_COMMUNITY): Payer: Self-pay

## 2022-01-13 ENCOUNTER — Other Ambulatory Visit (HOSPITAL_COMMUNITY): Payer: Self-pay

## 2022-01-16 ENCOUNTER — Other Ambulatory Visit (HOSPITAL_COMMUNITY): Payer: Self-pay

## 2022-01-24 ENCOUNTER — Other Ambulatory Visit (HOSPITAL_COMMUNITY): Payer: Self-pay

## 2022-01-24 ENCOUNTER — Ambulatory Visit (HOSPITAL_COMMUNITY): Payer: 59 | Attending: Family Medicine

## 2022-01-24 ENCOUNTER — Ambulatory Visit (HOSPITAL_COMMUNITY): Admission: RE | Admit: 2022-01-24 | Payer: 59 | Source: Ambulatory Visit

## 2022-01-31 ENCOUNTER — Other Ambulatory Visit (HOSPITAL_COMMUNITY): Payer: Self-pay

## 2022-01-31 DIAGNOSIS — S83242D Other tear of medial meniscus, current injury, left knee, subsequent encounter: Secondary | ICD-10-CM | POA: Diagnosis not present

## 2022-01-31 MED ORDER — D-5000 125 MCG (5000 UT) PO TABS: 1.0000 | ORAL_TABLET | Freq: Every day | ORAL | 0 refills | Status: DC

## 2022-01-31 MED ORDER — LIDOCAINE 5 % EX PTCH
MEDICATED_PATCH | CUTANEOUS | 0 refills | Status: DC
  Filled 2022-01-31: qty 30, 30d supply, fill #0

## 2022-01-31 MED ORDER — CALCIUM 500 + D3 500-15 MG-MCG PO TABS: ORAL_TABLET | ORAL | 0 refills | Status: DC

## 2022-02-03 ENCOUNTER — Other Ambulatory Visit (HOSPITAL_COMMUNITY): Payer: Self-pay

## 2022-02-06 DIAGNOSIS — M25562 Pain in left knee: Secondary | ICD-10-CM | POA: Diagnosis not present

## 2022-02-10 ENCOUNTER — Other Ambulatory Visit (HOSPITAL_COMMUNITY): Payer: Self-pay

## 2022-02-10 DIAGNOSIS — M25562 Pain in left knee: Secondary | ICD-10-CM | POA: Diagnosis not present

## 2022-02-10 MED ORDER — LIDOCAINE 5 % EX PTCH
MEDICATED_PATCH | CUTANEOUS | 0 refills | Status: DC
  Filled 2022-02-10 – 2022-02-24 (×2): qty 30, 30d supply, fill #0

## 2022-02-12 ENCOUNTER — Other Ambulatory Visit (HOSPITAL_COMMUNITY): Payer: Self-pay

## 2022-02-15 ENCOUNTER — Ambulatory Visit (HOSPITAL_BASED_OUTPATIENT_CLINIC_OR_DEPARTMENT_OTHER)
Admission: RE | Admit: 2022-02-15 | Discharge: 2022-02-15 | Disposition: A | Discharge status: Home / Self Care | Payer: 59 | Source: Ambulatory Visit | Attending: Family Medicine | Admitting: Family Medicine

## 2022-02-15 ENCOUNTER — Other Ambulatory Visit (HOSPITAL_COMMUNITY): Payer: Self-pay

## 2022-02-15 DIAGNOSIS — G542 Cervical root disorders, not elsewhere classified: Secondary | ICD-10-CM | POA: Insufficient documentation

## 2022-02-15 DIAGNOSIS — M542 Cervicalgia: Secondary | ICD-10-CM | POA: Insufficient documentation

## 2022-02-15 DIAGNOSIS — M47812 Spondylosis without myelopathy or radiculopathy, cervical region: Secondary | ICD-10-CM | POA: Diagnosis not present

## 2022-02-15 DIAGNOSIS — M545 Low back pain, unspecified: Secondary | ICD-10-CM | POA: Diagnosis not present

## 2022-02-15 DIAGNOSIS — R202 Paresthesia of skin: Secondary | ICD-10-CM | POA: Diagnosis not present

## 2022-02-17 ENCOUNTER — Other Ambulatory Visit (HOSPITAL_COMMUNITY): Payer: Self-pay

## 2022-02-17 ENCOUNTER — Ambulatory Visit: Payer: 59 | Admitting: Family Medicine

## 2022-02-17 VITALS — BP 162/103 | HR 94 | Temp 98.4°F | Ht 70.0 in | Wt 265.0 lb

## 2022-02-17 DIAGNOSIS — M5440 Lumbago with sciatica, unspecified side: Secondary | ICD-10-CM

## 2022-02-17 DIAGNOSIS — G8929 Other chronic pain: Secondary | ICD-10-CM | POA: Diagnosis not present

## 2022-02-17 DIAGNOSIS — M545 Low back pain, unspecified: Secondary | ICD-10-CM

## 2022-02-17 DIAGNOSIS — G542 Cervical root disorders, not elsewhere classified: Secondary | ICD-10-CM

## 2022-02-17 MED ORDER — HYDROCODONE-ACETAMINOPHEN 10-325 MG PO TABS
1.0000 | ORAL_TABLET | Freq: Four times a day (QID) | ORAL | 0 refills | Status: DC | PRN
  Filled 2022-02-17 – 2022-02-24 (×2): qty 120, 30d supply, fill #0

## 2022-02-17 MED ORDER — GABAPENTIN 100 MG PO CAPS
ORAL_CAPSULE | ORAL | 3 refills | Status: DC
  Filled 2022-02-17: qty 180, 30d supply, fill #0
  Filled 2022-03-14: qty 180, 30d supply, fill #1

## 2022-02-17 MED ORDER — HYDROCODONE-ACETAMINOPHEN 10-325 MG PO TABS
1.0000 | ORAL_TABLET | Freq: Four times a day (QID) | ORAL | 0 refills | Status: DC | PRN
  Filled 2022-02-17: qty 120, 30d supply, fill #0

## 2022-02-17 MED ORDER — HYDROCODONE-ACETAMINOPHEN 10-325 MG PO TABS
1.0000 | ORAL_TABLET | Freq: Four times a day (QID) | ORAL | 0 refills | Status: DC | PRN
  Filled 2022-02-17 – 2022-03-26 (×2): qty 120, 30d supply, fill #0

## 2022-02-17 NOTE — Progress Notes (Signed)
   Subjective:    Patient ID: Marcus Hayes, male    DOB: 11/01/1960, 61 y.o.   MRN: [id number]  HPI  Discuss pain and MRI results C/o arm itching due to tingling and numbness of L the arm Received 30 pain pills on Saturday  Patient relates he has been under a lot of pain and discomfort and stress not feeling good because of this.  Denies any type of chest tightness pressure pain shortness of breath Denies any pedal edema.  He does relate a lot of pain and discomfort in his neck radiates down the left arm.  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  He will need to see neurosurgery they will have to do further evaluation and then decide if he can go back to work or have to have surgery he is to follow-up with any particular problems otherwise  We will see him back in 6 weeks to recheck pressure    Assessment & Plan:  1. Cervical nerve root impingement Referral to neurosurgery MRI reviewed with patient Pain medicine as needed Gabapentin to help with left arm numbness pain and discomfort caution drowsiness proper way to titrate up was discussed if side effects notify us - Ambulatory referral to Neurosurgery He was cautioned not to operate machinery if feeling drowsy 2. Lumbar pain Pain medicine caution drowsiness new  prescriptions were sent in follow-up in 3 months - Ambulatory referral to Neurosurgery  3. Chronic midline low back pain with sciatica, sciatica laterality unspecified The patient was seen in followup for chronic pain. A review over at their current pain status was discussed. Drug registry was checked. Prescriptions were given.  Regular follow-up recommended. Discussion was held regarding the importance of compliance with medication as well as pain medication contract.  Patient was informed that medication may cause drowsiness and should not be combined  with other medications/alcohol or street drugs. If the patient feels medication is causing altered alertness then do not drive or operate dangerous equipment.  Should be noted that the patient appears to be meeting appropriate use of opioids and response.  Evidenced by improved function and decent pain control without significant side effects and no evidence of overt aberrancy issues.  Upon discussion with the patient today they understand that opioid therapy is optional and they feel that the pain has been refractory to reasonable conservative measures and is significant and affecting quality of life enough to warrant ongoing therapy and wishes to continue opioids.  Refills were provided.  - Ambulatory referral to Neurosurgery

## 2022-02-17 NOTE — Progress Notes (Signed)
Note placed on appt note for 03/31/22

## 2022-02-18 ENCOUNTER — Other Ambulatory Visit (HOSPITAL_COMMUNITY): Payer: Self-pay

## 2022-02-18 NOTE — Progress Notes (Signed)
Pt contacted and verbalized understanding. Nurse visit scheduled for Aug 15. (Pt states he will be available after Aug 11)

## 2022-02-24 ENCOUNTER — Other Ambulatory Visit (HOSPITAL_COMMUNITY): Payer: Self-pay

## 2022-03-05 ENCOUNTER — Other Ambulatory Visit (HOSPITAL_COMMUNITY): Payer: Self-pay

## 2022-03-05 ENCOUNTER — Other Ambulatory Visit: Payer: Self-pay | Admitting: Family Medicine

## 2022-03-05 DIAGNOSIS — M5412 Radiculopathy, cervical region: Secondary | ICD-10-CM | POA: Diagnosis not present

## 2022-03-05 MED ORDER — ATORVASTATIN CALCIUM 20 MG PO TABS
ORAL_TABLET | Freq: Every evening | ORAL | 1 refills | Status: DC
  Filled 2022-03-05 – 2022-03-14 (×2): qty 90, 90d supply, fill #0
  Filled 2022-06-11: qty 90, 90d supply, fill #1

## 2022-03-13 ENCOUNTER — Other Ambulatory Visit (HOSPITAL_COMMUNITY): Payer: Self-pay

## 2022-03-14 ENCOUNTER — Other Ambulatory Visit (HOSPITAL_COMMUNITY): Payer: Self-pay

## 2022-03-18 ENCOUNTER — Other Ambulatory Visit: Payer: Self-pay | Admitting: Neurological Surgery

## 2022-03-18 ENCOUNTER — Ambulatory Visit: Payer: 59

## 2022-03-21 ENCOUNTER — Other Ambulatory Visit (HOSPITAL_COMMUNITY): Payer: Self-pay

## 2022-03-23 NOTE — Telephone Encounter (Signed)
I will recheck blood pressure on his follow-up visit at the end of the month thank you

## 2022-03-26 ENCOUNTER — Other Ambulatory Visit: Payer: Self-pay | Admitting: Family Medicine

## 2022-03-26 ENCOUNTER — Other Ambulatory Visit (HOSPITAL_COMMUNITY): Payer: Self-pay
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
## 2022-03-27 MED ORDER — MELOXICAM 15 MG PO TABS
ORAL_TABLET | ORAL | 1 refills | Status: DC
  Filled 2022-03-27: qty 30, 30d supply, fill #0

## 2022-03-28 ENCOUNTER — Other Ambulatory Visit (HOSPITAL_COMMUNITY): Payer: Self-pay

## 2022-03-31 ENCOUNTER — Encounter: Payer: Self-pay | Admitting: Family Medicine

## 2022-03-31 ENCOUNTER — Other Ambulatory Visit (HOSPITAL_COMMUNITY): Payer: Self-pay

## 2022-03-31 ENCOUNTER — Ambulatory Visit: Payer: 59 | Admitting: Family Medicine

## 2022-03-31 VITALS — BP 132/88 | Wt 252.2 lb

## 2022-03-31 DIAGNOSIS — Z125 Encounter for screening for malignant neoplasm of prostate: Secondary | ICD-10-CM | POA: Diagnosis not present

## 2022-03-31 DIAGNOSIS — M25562 Pain in left knee: Secondary | ICD-10-CM | POA: Diagnosis not present

## 2022-03-31 DIAGNOSIS — G8929 Other chronic pain: Secondary | ICD-10-CM | POA: Diagnosis not present

## 2022-03-31 DIAGNOSIS — M1000 Idiopathic gout, unspecified site: Secondary | ICD-10-CM

## 2022-03-31 DIAGNOSIS — Z79899 Other long term (current) drug therapy: Secondary | ICD-10-CM | POA: Diagnosis not present

## 2022-03-31 DIAGNOSIS — Z79891 Long term (current) use of opiate analgesic: Secondary | ICD-10-CM | POA: Diagnosis not present

## 2022-03-31 DIAGNOSIS — M25552 Pain in left hip: Secondary | ICD-10-CM

## 2022-03-31 DIAGNOSIS — E7849 Other hyperlipidemia: Secondary | ICD-10-CM

## 2022-03-31 DIAGNOSIS — M5442 Lumbago with sciatica, left side: Secondary | ICD-10-CM

## 2022-03-31 DIAGNOSIS — M5441 Lumbago with sciatica, right side: Secondary | ICD-10-CM | POA: Diagnosis not present

## 2022-03-31 MED ORDER — HYDROCODONE-ACETAMINOPHEN 10-325 MG PO TABS: ORAL_TABLET | ORAL | 0 refills | Status: DC

## 2022-03-31 MED ORDER — HYDROCODONE-ACETAMINOPHEN 10-325 MG PO TABS
ORAL_TABLET | ORAL | 0 refills | Status: DC
  Filled 2022-03-31: qty 150, fill #0

## 2022-03-31 MED ORDER — HYDROCODONE-ACETAMINOPHEN 10-325 MG PO TABS
ORAL_TABLET | ORAL | 0 refills | Status: DC
  Filled 2022-03-31: qty 150, fill #0
  Filled 2022-04-21: qty 150, 30d supply, fill #0

## 2022-03-31 NOTE — Progress Notes (Signed)
Subjective:    Patient ID: Marcus Hayes, male    DOB: 05-06-1961, 61 y.o.   MRN: [id number]  HPI This patient was seen today for chronic pain  The medication list was reviewed and updated.  Location of Pain for which the patient has been treated with regarding narcotics:   Onset of this pain:    -Compliance with medication: hydrocodone 10-325 mg  - Number patient states they take daily: 4  -when was the last dose patient took? Last night  The patient was advised the importance of maintaining medication and not using illegal substances with these.  Here for refills and follow up  The patient was educated that we can provide 3 monthly scripts for their medication, it is their responsibility to follow the instructions.  Side effects or complications from medications: none  Patient is aware that pain medications are meant to minimize the severity of the pain to allow their pain levels to improve to allow for better function. They are aware of that pain medications cannot totally remove their pain.  Due for UDT ( at least once per year) : will complete today  Scale of 1 to 10 ( 1 is least 10 is most) Your pain level without the medicine: 10 Your pain level with medication: 8  Scale 1 to 10 ( 1-helps very little, 10 helps very well) How well does your pain medication reduce your pain so you can function better through out the day? 3  Quality of the pain: Having throbbing aching pain and discomfort that is worse in his neck as well as lower back but also in his knee  Persistence of the pain: It is present all the time  Modifying factors: Worse with activity  Pt states he has persistent back pain and neck pain. Pt also having left hip pain.     Lumbar Injections did not help  Using hosp bed he relates he uses the bed to help prop himself in various positions but does not sleep well at all  Not sleeping well  Pain meds not helping Taking 2 in the morning 2 in the  evening  Review of Systems     Objective:   Physical Exam  General-in no acute distress Eyes-no discharge Lungs-respiratory rate normal, CTA CV-no murmurs,RRR Extremities skin warm dry no edema Neuro grossly normal Behavior normal, alert Subjective discomfort in his cervical spine lumbar spine as well as his left knee      Assessment & Plan:   Weight loss patient having weight loss because he is doing a great job watching his diet  1. Encounter for long-term opiate analgesic use The patient was seen in followup for chronic pain. A review over at their current pain status was discussed. Drug registry was checked. Prescriptions were given.  Regular follow-up recommended. Discussion was held regarding the importance of compliance with medication as well as pain medication contract.  Patient was informed that medication may cause drowsiness and should not be combined  with other medications/alcohol or street drugs. If the patient feels medication is causing altered alertness then do not drive or operate dangerous equipment.  Should be noted that the patient appears to be meeting appropriate use of opioids and response.  Evidenced by improved function and decent pain control without significant side effects and no evidence of overt aberrancy issues.  Upon discussion with the patient today they understand that opioid therapy is optional and they feel that the pain has been refractory to reasonable  conservative measures and is significant and affecting quality of life enough to warrant ongoing therapy and wishes to continue opioids.  Refills were provided. We will allow him to have 5/day maximum if this does not do well enough he will need to be switched over to oxycodone and followed up closely - ToxASSURE Select 13 (MW), Urine  2. Acute pain of left knee Has had knee surgery not recovering well they have tried physical therapy but that made it worse hopefully it will get better over  time  3. Left hip pain Possibly due to compensation and how he is walking is triggering this if progressive troubles follow-up with orthopedics  4. Other hyperlipidemia Labs ordered await results - Uric Acid - CBC with Differential - Basic metabolic panel - Lipid panel - Hepatic function panel - PSA  5. Idiopathic gout, unspecified chronicity, unspecified site Lab as ordered await results - Uric Acid - CBC with Differential - Basic metabolic panel - Lipid panel - Hepatic function panel - PSA  6. Chronic low back pain with bilateral sciatica, unspecified back pain laterality May use pain medicine stretching exercises recommended he is holding off on any type of injections or surgery  He does have upcoming surgery of his neck and unfortunately cannot work with the way his knee is and cannot work with the way his back is more than likely will be out of work for period of time after his surgery  7. Screening PSA (prostate specific antigen) Screening PSA - Uric Acid - CBC with Differential - Basic metabolic panel - Lipid panel - Hepatic function panel - PSA  8. High risk medication use Labs ordered - Uric Acid - CBC with Differential - Basic metabolic panel - Lipid panel - Hepatic function panel - PSA Healthy diet recommended

## 2022-04-04 LAB — TOXASSURE SELECT 13 (MW), URINE

## 2022-04-09 ENCOUNTER — Other Ambulatory Visit (HOSPITAL_COMMUNITY): Payer: Self-pay

## 2022-04-09 DIAGNOSIS — M25562 Pain in left knee: Secondary | ICD-10-CM | POA: Diagnosis not present

## 2022-04-09 MED ORDER — MELOXICAM 15 MG PO TABS
ORAL_TABLET | ORAL | 1 refills | Status: DC
  Filled 2022-04-09 – 2022-08-28 (×3): qty 30, 30d supply, fill #0
  Filled 2022-10-31: qty 30, 30d supply, fill #1

## 2022-04-09 MED ORDER — LIDOCAINE 5 % EX PTCH
MEDICATED_PATCH | CUTANEOUS | 0 refills | Status: DC
  Filled 2022-04-09: qty 30, 30d supply, fill #0

## 2022-04-11 ENCOUNTER — Other Ambulatory Visit (HOSPITAL_COMMUNITY): Payer: Self-pay

## 2022-04-14 NOTE — Pre-Procedure Instructions (Signed)
Surgical Instructions    Your procedure is scheduled on April 24, 2022.  Report to [facility] Main Entrance "A" at 5:30 A.M., then check in with the Admitting office.  Call this number if you have problems the morning of surgery:  [phone]   If you have any questions prior to your surgery date call [phone]: Open Monday-Friday 8am-4pm    Remember:  Do not eat after midnight the night before your surgery  You may drink clear liquids until 4:30 AM the morning of your surgery.   Clear liquids allowed are: Water, Non-Citrus Juices (without pulp), Carbonated Beverages, Clear Tea, Black Coffee Only (NO MILK, CREAM OR POWDERED CREAMER of any kind), and Gatorade.     Take these medicines the morning of surgery with A SIP OF WATER:  allopurinol (ZYLOPRIM)   gabapentin (NEURONTIN)  pantoprazole (PROTONIX)  Take these medicines the morning of surgery AS NEEDED: colchicine HYDROcodone-acetaminophen (NORCO)    As of today, STOP taking any Aspirin (unless specific instructions given by your surgeon), Aleve, Naproxen, Ibuprofen, Motrin, Advil, Goody's, BC's, all herbal medications, fish oil, and all vitamins. This includes your medication: meloxicam (MOBIC)                     Do NOT Smoke (Tobacco/Vaping) for 24 hours prior to your procedure.  If you use a CPAP at night, you may bring your mask/headgear for your overnight stay.   Contacts, glasses, piercing's, hearing aid's, dentures or partials may not be worn into surgery, please bring cases for these belongings.    For patients admitted to the hospital, discharge time will be determined by your treatment team.   Patients discharged the day of surgery will not be allowed to drive home, and someone needs to stay with them for 24 hours.  SURGICAL WAITING ROOM VISITATION Patients having surgery or a procedure may have no more than 2 support people in the waiting area - these visitors may rotate.   Children under the age of 25  must have an adult with them who is not the patient. If the patient needs to stay at the hospital during part of their recovery, the visitor guidelines for inpatient rooms apply. Pre-op nurse will coordinate an appropriate time for 1 support person to accompany patient in pre-op.  This support person may not rotate.   Please refer to the [facility] website for the visitor guidelines for Inpatients (after your surgery is over and you are in a regular room).    Special instructions:   [place]- Preparing For Surgery  Before surgery, you can play an important role. Because skin is not sterile, your skin needs to be as free of germs as possible. You can reduce the number of germs on your skin by washing with CHG (chlorahexidine gluconate) Soap before surgery.  CHG is an antiseptic cleaner which kills germs and bonds with the skin to continue killing germs even after washing.    Oral Hygiene is also important to reduce your risk of infection.  Remember - BRUSH YOUR TEETH THE MORNING OF SURGERY WITH YOUR REGULAR TOOTHPASTE  Please do not use if you have an allergy to CHG or antibacterial soaps. If your skin becomes reddened/irritated stop using the CHG.  Do not shave (including legs and underarms) for at least 48 hours prior to first CHG shower. It is OK to shave your face.  Please follow these instructions carefully.   Shower the NIGHT BEFORE SURGERY and the MORNING OF SURGERY  If you chose to wash your hair, wash your hair first as usual with your normal shampoo.  After you shampoo, rinse your hair and body thoroughly to remove the shampoo.  Use CHG Soap as you would any other liquid soap. You can apply CHG directly to the skin and wash gently with a scrungie or a clean washcloth.   Apply the CHG Soap to your body ONLY FROM THE NECK DOWN.  Do not use on open wounds or open sores. Avoid contact with your eyes, ears, mouth and genitals (private parts). Wash Face and genitals (private parts)   with your normal soap.   Wash thoroughly, paying special attention to the area where your surgery will be performed.  Thoroughly rinse your body with warm water from the neck down.  DO NOT shower/wash with your normal soap after using and rinsing off the CHG Soap.  Pat yourself dry with a CLEAN TOWEL.  Wear CLEAN PAJAMAS to bed the night before surgery  Place CLEAN SHEETS on your bed the night before your surgery  DO NOT SLEEP WITH PETS.   Day of Surgery: Take a shower with CHG soap. Do not wear jewelry or makeup Do not wear lotions, powders, colognes, or deodorant. Do not shave 48 hours prior to surgery.  Men may shave face and neck. Do not bring valuables to the hospital.  [facility] is not responsible for any belongings or valuables. Do not wear nail polish, gel polish, artificial nails, or any other type of covering on natural nails (fingers and toes) If you have artificial nails or gel coating that need to be removed by a nail salon, please have this removed prior to surgery. Artificial nails or gel coating may interfere with anesthesia's ability to adequately monitor your vital signs.  Wear Clean/Comfortable clothing the morning of surgery Remember to brush your teeth WITH YOUR REGULAR TOOTHPASTE.   Please read over the following fact sheets that you were given.    If you received a COVID test during your pre-op visit  it is requested that you wear a mask when out in public, stay away from anyone that may not be feeling well and notify your surgeon if you develop symptoms. If you have been in contact with anyone that has tested positive in the last 10 days please notify you surgeon.

## 2022-04-15 ENCOUNTER — Encounter (HOSPITAL_COMMUNITY): Payer: Self-pay

## 2022-04-15 ENCOUNTER — Encounter (HOSPITAL_COMMUNITY): Payer: Self-pay | Admitting: Physician Assistant

## 2022-04-15 ENCOUNTER — Encounter (HOSPITAL_COMMUNITY)
Admission: RE | Admit: 2022-04-15 | Discharge: 2022-04-15 | Disposition: A | Discharge status: Home / Self Care | Payer: 59 | Source: Ambulatory Visit | Attending: Neurological Surgery | Admitting: Neurological Surgery

## 2022-04-15 ENCOUNTER — Other Ambulatory Visit: Payer: Self-pay

## 2022-04-15 DIAGNOSIS — N289 Disorder of kidney and ureter, unspecified: Secondary | ICD-10-CM | POA: Diagnosis not present

## 2022-04-15 DIAGNOSIS — Z01818 Encounter for other preprocedural examination: Secondary | ICD-10-CM

## 2022-04-15 DIAGNOSIS — Z01812 Encounter for preprocedural laboratory examination: Secondary | ICD-10-CM | POA: Diagnosis not present

## 2022-04-15 LAB — CBC
HCT: 47.5 % (ref 39.0–52.0)
Hemoglobin: 16 g/dL (ref 13.0–17.0)
MCH: 31.4 pg (ref 26.0–34.0)
MCHC: 33.7 g/dL (ref 30.0–36.0)
MCV: 93.1 fL (ref 80.0–100.0)
Platelets: 245 10*3/uL (ref 150–400)
RBC: 5.1 MIL/uL (ref 4.22–5.81)
RDW: 14.1 % (ref 11.5–15.5)
WBC: 9.5 10*3/uL (ref 4.0–10.5)
nRBC: 0 % (ref 0.0–0.2)

## 2022-04-15 LAB — BASIC METABOLIC PANEL
Anion gap: 9 (ref 5–15)
BUN: 10 mg/dL (ref 6–20)
CO2: 22 mmol/L (ref 22–32)
Calcium: 9.6 mg/dL (ref 8.9–10.3)
Chloride: 109 mmol/L (ref 98–111)
Creatinine, Ser: 0.95 mg/dL (ref 0.61–1.24)
GFR, Estimated: >60 mL/min (ref >60)
Glucose, Bld: 129 mg/dL — ABNORMAL HIGH (ref 70–99)
Potassium: 4.3 mmol/L (ref 3.5–5.1)
Sodium: 140 mmol/L (ref 135–145)

## 2022-04-15 LAB — TYPE AND SCREEN
ABO/RH(D): B POS
Antibody Screen: NEGATIVE

## 2022-04-15 LAB — SURGICAL PCR SCREEN
MRSA, PCR: NEGATIVE
Staphylococcus aureus: NEGATIVE

## 2022-04-15 NOTE — Progress Notes (Signed)
PCP - Dr. Lilyan Punt Cardiologist - Dr. Dina Rich (once time in 2021)  PPM/ICD - Denies Device Orders - n/a Rep Notified - n/a  Chest x-ray - 10/25/2017 EKG - 04/23/2020 Stress Test - 04/23/2020 ECHO - Denies Cardiac Cath - Denies  Sleep Study - Denies CPAP - n/a  No DM  Blood Thinner Instructions: n/a Aspirin Instructions: n/a  ERAS Protcol - Clear liquids until 0430 morning of surgery PRE-SURGERY Ensure or G2- none ordered  COVID TEST- n/a   Anesthesia review: No.   Patient denies shortness of breath, fever, cough and chest pain at PAT appointment   All instructions explained to the patient, with a verbal understanding of the material. Patient agrees to go over the instructions while at home for a better understanding. Patient also instructed to self quarantine after being tested for COVID-19. The opportunity to ask questions was provided.

## 2022-04-19 ENCOUNTER — Other Ambulatory Visit (HOSPITAL_COMMUNITY): Payer: Self-pay

## 2022-04-21 ENCOUNTER — Other Ambulatory Visit (HOSPITAL_COMMUNITY): Payer: Self-pay

## 2022-04-24 ENCOUNTER — Ambulatory Visit (HOSPITAL_COMMUNITY): Admission: RE | Admit: 2022-04-24 | Payer: 59 | Source: Home / Self Care | Admitting: Neurological Surgery

## 2022-04-24 ENCOUNTER — Encounter (HOSPITAL_COMMUNITY): Admission: RE | Payer: Self-pay | Source: Home / Self Care

## 2022-04-24 SURGERY — ANTERIOR CERVICAL DECOMPRESSION/DISCECTOMY FUSION 1 LEVEL
Anesthesia: General

## 2022-04-28 ENCOUNTER — Other Ambulatory Visit: Payer: Self-pay | Admitting: Neurological Surgery

## 2022-05-05 ENCOUNTER — Ambulatory Visit (INDEPENDENT_AMBULATORY_CARE_PROVIDER_SITE_OTHER): Payer: Self-pay | Admitting: Family Medicine

## 2022-05-05 VITALS — BP 126/81 | HR 114 | Temp 98.6°F | Wt 250.0 lb

## 2022-05-05 DIAGNOSIS — M5441 Lumbago with sciatica, right side: Secondary | ICD-10-CM

## 2022-05-05 DIAGNOSIS — M5442 Lumbago with sciatica, left side: Secondary | ICD-10-CM

## 2022-05-05 DIAGNOSIS — Z79891 Long term (current) use of opiate analgesic: Secondary | ICD-10-CM

## 2022-05-05 DIAGNOSIS — G8929 Other chronic pain: Secondary | ICD-10-CM

## 2022-05-05 NOTE — Progress Notes (Signed)
-------------  SDW INSTRUCTIONS given:  Your procedure is scheduled on 05/06/22.  Report to [facility] Main Entrance "A" at 0530 A.M., and check in at the Admitting office.  Call this number if you have problems the morning of surgery:  [phone]   Remember:  Do not eat after midnight the night before your surgery  You may drink clear liquids until 0430 the morning of your surgery.   Clear liquids allowed are: Water, Non-Citrus Juices (without pulp), Carbonated Beverages, Clear Tea, Black Coffee Only, and Gatorade    Take these medicines the morning of surgery with A SIP OF WATER  allopurinol (ZYLOPRIM) gabapentin (NEURONTIN) pantoprazole (PROTONIX)  Colchicine-if needed HYDROcodone-acetaminophen (NORCO)-if needed  As of today, STOP taking any Aspirin (unless otherwise instructed by your surgeon) Aleve, Naproxen, Ibuprofen, Motrin, Advil, Goody's, BC's, all herbal medications, fish oil, and all vitamins.                      Do not wear jewelry, make up, or nail polish            Do not wear lotions, powders, perfumes/colognes, or deodorant.            Do not shave 48 hours prior to surgery.  Men may shave face and neck.            Do not bring valuables to the hospital.            [facility] is not responsible for any belongings or valuables.  Do NOT Smoke (Tobacco/Vaping) 24 hours prior to your procedure If you use a CPAP at night, you may bring all equipment for your overnight stay.   Contacts, glasses, dentures or bridgework may not be worn into surgery.      For patients admitted to the hospital, discharge time will be determined by your treatment team.   Patients discharged the day of surgery will not be allowed to drive home, and someone needs to stay with them for 24 hours.    Special instructions:   [place]- Preparing For Surgery  Before surgery, you can play an important role. Because skin is not sterile, your skin needs to be as free of germs as possible.  You can reduce the number of germs on your skin by washing with CHG (chlorahexidine gluconate) Soap before surgery.  CHG is an antiseptic cleaner which kills germs and bonds with the skin to continue killing germs even after washing.    Oral Hygiene is also important to reduce your risk of infection.  Remember - BRUSH YOUR TEETH THE MORNING OF SURGERY WITH YOUR REGULAR TOOTHPASTE  Please do not use if you have an allergy to CHG or antibacterial soaps. If your skin becomes reddened/irritated stop using the CHG.  Do not shave (including legs and underarms) for at least 48 hours prior to first CHG shower. It is OK to shave your face.  Please follow these instructions carefully.   Shower the NIGHT BEFORE SURGERY and the MORNING OF SURGERY with DIAL Soap.   Pat yourself dry with a CLEAN TOWEL.  Wear CLEAN PAJAMAS to bed the night before surgery  Place CLEAN SHEETS on your bed the night of your first shower and DO NOT SLEEP WITH PETS.   Day of Surgery: Please shower morning of surgery  Wear Clean/Comfortable clothing the morning of surgery Do not apply any deodorants/lotions.   Remember to brush your teeth WITH YOUR REGULAR TOOTHPASTE.   Questions were answered.  Patient verbalized understanding of instructions.

## 2022-05-05 NOTE — Progress Notes (Signed)
   Subjective:    Patient ID: Marcus Hayes, male    DOB: 08/21/1960, 61 y.o.   MRN: [id number]  HPI Pain management has neck surgery tomorrow This patient was seen today for chronic pain  The medication list was reviewed and updated.  Location of Pain for which the patient has been treated with regarding narcotics: neck , back and knee  Onset of this pain: chronic   -Compliance with medication: daily  - Number patient states they take daily: 5 per day  -when was the last dose patient took? today  The patient was advised the importance of maintaining medication and not using illegal substances with these.  Here for refills and follow up  The patient was educated that we can provide 3 monthly scripts for their medication, it is their responsibility to follow the instructions.  Side effects or complications from medications: none  Patient is aware that pain medications are meant to minimize the severity of the pain to allow their pain levels to improve to allow for better function. They are aware of that pain medications cannot totally remove their pain.  Due for UDT ( at least once per year) : 03/31/22  Scale of 1 to 10 ( 1 is least 10 is most) Your pain level without the medicine: 10 Your pain level with medication 7 to 8  Scale 1 to 10 ( 1-helps very little, 10 helps very well) How well does your pain medication reduce your pain so you can function better through out the day? A little  Quality of the pain: achy/ sharp  Persistence of the pain: constant  Modifying factors:        Review of Systems     Objective:   Physical Exam  General-in no acute distress Eyes-no discharge Lungs-respiratory rate normal, CTA CV-no murmurs,RRR Extremities skin warm dry no edema Neuro grossly normal Behavior normal, alert       Assessment & Plan:  Patient with significant cervical spine problem and pain will be seen specialist to have surgery tomorrow.  He typically  takes hydrocodone 5 times per day it is quite possible he will need some stronger after surgery for a few days.  I told the patient that if the surgeon prescribed any type of oxycodone he is absolutely not to take his hydrocodone while taking the oxycodone.  Also it would be to his benefit to transition from any stronger pain medicine back to his original pain medicine within 5 days if possible  Patient also with chronic knee pain despite having surgery physical therapy has ongoing knee pain which prohibits any type of regular walking.  Patient also has a history of lumbar pain and discomfort.  Currently right now based upon his neck his knee low back in accumulation of health problems patient unable to work.  He is currently in the process of looking into filing for Social Security.  He is on long-term disability currently with his work.  Patient was made aware that governmental disability is a legal decision based on medical history patient's age and occupation and educational background

## 2022-05-06 ENCOUNTER — Ambulatory Visit (HOSPITAL_BASED_OUTPATIENT_CLINIC_OR_DEPARTMENT_OTHER): Payer: 59 | Admitting: Anesthesiology

## 2022-05-06 ENCOUNTER — Other Ambulatory Visit (HOSPITAL_COMMUNITY): Payer: Self-pay

## 2022-05-06 ENCOUNTER — Other Ambulatory Visit: Payer: Self-pay

## 2022-05-06 ENCOUNTER — Encounter (HOSPITAL_COMMUNITY): Payer: Self-pay | Admitting: Neurological Surgery

## 2022-05-06 ENCOUNTER — Encounter (HOSPITAL_COMMUNITY)
Admission: RE | Disposition: A | Discharge status: Home / Self Care | Payer: Self-pay | Source: Home / Self Care | Attending: Neurological Surgery

## 2022-05-06 ENCOUNTER — Ambulatory Visit (HOSPITAL_COMMUNITY): Payer: 59

## 2022-05-06 ENCOUNTER — Observation Stay (HOSPITAL_COMMUNITY)
Admission: RE | Admit: 2022-05-06 | Discharge: 2022-05-07 | Disposition: A | Discharge status: Home / Self Care | Payer: 59 | Attending: Neurological Surgery | Admitting: Neurological Surgery

## 2022-05-06 ENCOUNTER — Ambulatory Visit (HOSPITAL_COMMUNITY): Payer: 59 | Admitting: Anesthesiology

## 2022-05-06 DIAGNOSIS — M50822 Other cervical disc disorders at C5-C6 level: Secondary | ICD-10-CM | POA: Diagnosis not present

## 2022-05-06 DIAGNOSIS — M4722 Other spondylosis with radiculopathy, cervical region: Principal | ICD-10-CM | POA: Insufficient documentation

## 2022-05-06 DIAGNOSIS — M50122 Cervical disc disorder at C5-C6 level with radiculopathy: Secondary | ICD-10-CM | POA: Diagnosis not present

## 2022-05-06 DIAGNOSIS — M4322 Fusion of spine, cervical region: Secondary | ICD-10-CM | POA: Diagnosis not present

## 2022-05-06 HISTORY — PX: ANTERIOR CERVICAL DECOMP/DISCECTOMY FUSION: SHX1161

## 2022-05-06 SURGERY — ANTERIOR CERVICAL DECOMPRESSION/DISCECTOMY FUSION 1 LEVEL
Anesthesia: General | Site: Spine Cervical

## 2022-05-06 MED ORDER — LIDOCAINE-EPINEPHRINE 1 %-1:100000 IJ SOLN
INTRAMUSCULAR | Status: DC | PRN
  Administered 2022-05-06: 4 mL

## 2022-05-06 MED ORDER — ONDANSETRON HCL 4 MG/2ML IJ SOLN
INTRAMUSCULAR | Status: AC
  Filled 2022-05-06: qty 2

## 2022-05-06 MED ORDER — CHLORHEXIDINE GLUCONATE 0.12 % MT SOLN
15.0000 mL | Freq: Once | OROMUCOSAL | Status: AC
  Administered 2022-05-06: 15 mL via OROMUCOSAL
  Filled 2022-05-06: qty 15

## 2022-05-06 MED ORDER — HYDROCODONE-ACETAMINOPHEN 10-325 MG PO TABS
1.0000 | ORAL_TABLET | ORAL | Status: DC | PRN
  Administered 2022-05-06: 1 via ORAL
  Administered 2022-05-06 – 2022-05-07 (×6): 2 via ORAL
  Filled 2022-05-06 (×6): qty 2
  Filled 2022-05-06: qty 1

## 2022-05-06 MED ORDER — KETAMINE HCL 50 MG/5ML IJ SOSY
PREFILLED_SYRINGE | INTRAMUSCULAR | Status: AC
  Filled 2022-05-06: qty 5

## 2022-05-06 MED ORDER — MIDAZOLAM HCL 2 MG/2ML IJ SOLN
INTRAMUSCULAR | Status: DC | PRN
  Administered 2022-05-06: 2 mg via INTRAVENOUS

## 2022-05-06 MED ORDER — PROPOFOL 10 MG/ML IV BOLUS
INTRAVENOUS | Status: AC
  Filled 2022-05-06: qty 20

## 2022-05-06 MED ORDER — ACETAMINOPHEN 10 MG/ML IV SOLN
INTRAVENOUS | Status: DC | PRN
  Administered 2022-05-06: 1000 mg via INTRAVENOUS

## 2022-05-06 MED ORDER — SENNA 8.6 MG PO TABS
1.0000 | ORAL_TABLET | Freq: Two times a day (BID) | ORAL | Status: DC
  Administered 2022-05-06 – 2022-05-07 (×3): 8.6 mg via ORAL
  Filled 2022-05-06 (×3): qty 1

## 2022-05-06 MED ORDER — KETAMINE HCL 10 MG/ML IJ SOLN
INTRAMUSCULAR | Status: DC | PRN
  Administered 2022-05-06: 40 mg via INTRAVENOUS
  Administered 2022-05-06: 10 mg via INTRAVENOUS

## 2022-05-06 MED ORDER — MEPERIDINE HCL 25 MG/ML IJ SOLN: 6.2500 mg | INTRAMUSCULAR | Status: DC | PRN

## 2022-05-06 MED ORDER — LIDOCAINE 2% (20 MG/ML) 5 ML SYRINGE
INTRAMUSCULAR | Status: DC | PRN
  Administered 2022-05-06: 40 mg via INTRAVENOUS

## 2022-05-06 MED ORDER — ACETAMINOPHEN 160 MG/5ML PO SOLN: 325.0000 mg | Freq: Once | ORAL | Status: DC | PRN

## 2022-05-06 MED ORDER — HYDROMORPHONE HCL 1 MG/ML IJ SOLN
0.2500 mg | INTRAMUSCULAR | Status: DC | PRN
  Administered 2022-05-06 (×2): 0.5 mg via INTRAVENOUS

## 2022-05-06 MED ORDER — LIDOCAINE-EPINEPHRINE 1 %-1:100000 IJ SOLN
INTRAMUSCULAR | Status: AC
  Filled 2022-05-06: qty 1

## 2022-05-06 MED ORDER — ROCURONIUM BROMIDE 10 MG/ML (PF) SYRINGE
PREFILLED_SYRINGE | INTRAVENOUS | Status: DC | PRN
  Administered 2022-05-06: 60 mg via INTRAVENOUS
  Administered 2022-05-06: 10 mg via INTRAVENOUS
  Administered 2022-05-06: 20 mg via INTRAVENOUS

## 2022-05-06 MED ORDER — BISACODYL 10 MG RE SUPP: 10.0000 mg | Freq: Every day | RECTAL | Status: DC | PRN

## 2022-05-06 MED ORDER — POLYETHYLENE GLYCOL 3350 17 G PO PACK: 17.0000 g | PACK | Freq: Every day | ORAL | Status: DC | PRN

## 2022-05-06 MED ORDER — FLEET ENEMA 7-19 GM/118ML RE ENEM: 1.0000 | ENEMA | Freq: Once | RECTAL | Status: DC | PRN

## 2022-05-06 MED ORDER — ACETAMINOPHEN 10 MG/ML IV SOLN: 1000.0000 mg | Freq: Once | INTRAVENOUS | Status: DC | PRN

## 2022-05-06 MED ORDER — ACETAMINOPHEN 325 MG PO TABS: 650.0000 mg | ORAL_TABLET | ORAL | Status: DC | PRN

## 2022-05-06 MED ORDER — MORPHINE SULFATE (PF) 2 MG/ML IV SOLN
2.0000 mg | INTRAVENOUS | Status: DC | PRN
  Administered 2022-05-06: 2 mg via INTRAVENOUS
  Filled 2022-05-06: qty 1

## 2022-05-06 MED ORDER — LIDOCAINE 2% (20 MG/ML) 5 ML SYRINGE
INTRAMUSCULAR | Status: AC
  Filled 2022-05-06: qty 5

## 2022-05-06 MED ORDER — DOCUSATE SODIUM 100 MG PO CAPS
100.0000 mg | ORAL_CAPSULE | Freq: Two times a day (BID) | ORAL | Status: DC
  Administered 2022-05-06 – 2022-05-07 (×3): 100 mg via ORAL
  Filled 2022-05-06 (×3): qty 1

## 2022-05-06 MED ORDER — THROMBIN 5000 UNITS EX SOLR
CUTANEOUS | Status: AC
  Filled 2022-05-06: qty 5000

## 2022-05-06 MED ORDER — ORAL CARE MOUTH RINSE: 15.0000 mL | Freq: Once | OROMUCOSAL | Status: AC

## 2022-05-06 MED ORDER — SODIUM CHLORIDE 0.9% FLUSH: 3.0000 mL | INTRAVENOUS | Status: DC | PRN

## 2022-05-06 MED ORDER — FENTANYL CITRATE (PF) 250 MCG/5ML IJ SOLN
INTRAMUSCULAR | Status: DC | PRN
  Administered 2022-05-06: 50 ug via INTRAVENOUS
  Administered 2022-05-06: 100 ug via INTRAVENOUS
  Administered 2022-05-06 (×2): 50 ug via INTRAVENOUS
  Administered 2022-05-06: 100 ug via INTRAVENOUS
  Administered 2022-05-06 (×3): 50 ug via INTRAVENOUS

## 2022-05-06 MED ORDER — ROCURONIUM BROMIDE 10 MG/ML (PF) SYRINGE
PREFILLED_SYRINGE | INTRAVENOUS | Status: AC
  Filled 2022-05-06: qty 10

## 2022-05-06 MED ORDER — SODIUM CHLORIDE 0.9 % IV SOLN: 250.0000 mL | INTRAVENOUS | Status: DC

## 2022-05-06 MED ORDER — LACTATED RINGERS IV SOLN: INTRAVENOUS | Status: DC

## 2022-05-06 MED ORDER — AMISULPRIDE (ANTIEMETIC) 5 MG/2ML IV SOLN: 10.0000 mg | Freq: Once | INTRAVENOUS | Status: DC | PRN

## 2022-05-06 MED ORDER — PROMETHAZINE HCL 25 MG/ML IJ SOLN: 6.2500 mg | INTRAMUSCULAR | Status: DC | PRN

## 2022-05-06 MED ORDER — DEXAMETHASONE SODIUM PHOSPHATE 10 MG/ML IJ SOLN
INTRAMUSCULAR | Status: AC
  Filled 2022-05-06: qty 1

## 2022-05-06 MED ORDER — ACETAMINOPHEN 325 MG PO TABS: 325.0000 mg | ORAL_TABLET | Freq: Once | ORAL | Status: DC | PRN

## 2022-05-06 MED ORDER — MENTHOL 3 MG MT LOZG
1.0000 | LOZENGE | OROMUCOSAL | Status: DC | PRN
  Administered 2022-05-06: 3 mg via ORAL
  Filled 2022-05-06: qty 9

## 2022-05-06 MED ORDER — SODIUM CHLORIDE 0.9% FLUSH
3.0000 mL | Freq: Two times a day (BID) | INTRAVENOUS | Status: DC
  Administered 2022-05-06: 3 mL via INTRAVENOUS

## 2022-05-06 MED ORDER — ALLOPURINOL 300 MG PO TABS
300.0000 mg | ORAL_TABLET | Freq: Every day | ORAL | Status: DC
  Administered 2022-05-06 – 2022-05-07 (×2): 300 mg via ORAL
  Filled 2022-05-06 (×2): qty 1

## 2022-05-06 MED ORDER — BUPIVACAINE HCL (PF) 0.5 % IJ SOLN
INTRAMUSCULAR | Status: DC | PRN
  Administered 2022-05-06: 4 mL

## 2022-05-06 MED ORDER — SUGAMMADEX SODIUM 200 MG/2ML IV SOLN
INTRAVENOUS | Status: DC | PRN
  Administered 2022-05-06: 300 mg via INTRAVENOUS

## 2022-05-06 MED ORDER — DEXAMETHASONE SODIUM PHOSPHATE 10 MG/ML IJ SOLN
INTRAMUSCULAR | Status: DC | PRN
  Administered 2022-05-06: 10 mg via INTRAVENOUS

## 2022-05-06 MED ORDER — PROPOFOL 10 MG/ML IV BOLUS
INTRAVENOUS | Status: DC | PRN
  Administered 2022-05-06: 50 mg via INTRAVENOUS
  Administered 2022-05-06: 150 mg via INTRAVENOUS

## 2022-05-06 MED ORDER — PHENOL 1.4 % MT LIQD: 1.0000 | OROMUCOSAL | Status: DC | PRN

## 2022-05-06 MED ORDER — METHOCARBAMOL 1000 MG/10ML IJ SOLN: 500.0000 mg | Freq: Four times a day (QID) | INTRAVENOUS | Status: DC | PRN

## 2022-05-06 MED ORDER — ALUM & MAG HYDROXIDE-SIMETH 200-200-20 MG/5ML PO SUSP: 30.0000 mL | Freq: Four times a day (QID) | ORAL | Status: DC | PRN

## 2022-05-06 MED ORDER — FENTANYL CITRATE (PF) 250 MCG/5ML IJ SOLN
INTRAMUSCULAR | Status: AC
  Filled 2022-05-06: qty 5

## 2022-05-06 MED ORDER — MIDAZOLAM HCL 2 MG/2ML IJ SOLN
INTRAMUSCULAR | Status: AC
  Filled 2022-05-06: qty 2

## 2022-05-06 MED ORDER — ONDANSETRON HCL 4 MG/2ML IJ SOLN: 4.0000 mg | Freq: Four times a day (QID) | INTRAMUSCULAR | Status: DC | PRN

## 2022-05-06 MED ORDER — ACETAMINOPHEN 10 MG/ML IV SOLN
INTRAVENOUS | Status: AC
  Filled 2022-05-06: qty 100

## 2022-05-06 MED ORDER — HYDROMORPHONE HCL 1 MG/ML IJ SOLN
INTRAMUSCULAR | Status: AC
  Filled 2022-05-06: qty 1

## 2022-05-06 MED ORDER — BUPIVACAINE HCL (PF) 0.5 % IJ SOLN
INTRAMUSCULAR | Status: AC
  Filled 2022-05-06: qty 30

## 2022-05-06 MED ORDER — METHOCARBAMOL 500 MG PO TABS
500.0000 mg | ORAL_TABLET | Freq: Four times a day (QID) | ORAL | Status: DC | PRN
  Administered 2022-05-06 – 2022-05-07 (×4): 500 mg via ORAL
  Filled 2022-05-06 (×4): qty 1

## 2022-05-06 MED ORDER — PHENYLEPHRINE HCL-NACL 20-0.9 MG/250ML-% IV SOLN
INTRAVENOUS | Status: DC | PRN
  Administered 2022-05-06: 40 ug/min via INTRAVENOUS

## 2022-05-06 MED ORDER — ONDANSETRON HCL 4 MG/2ML IJ SOLN
INTRAMUSCULAR | Status: DC | PRN
  Administered 2022-05-06: 4 mg via INTRAVENOUS

## 2022-05-06 MED ORDER — COLCHICINE 0.6 MG PO TABS: 0.6000 mg | ORAL_TABLET | Freq: Two times a day (BID) | ORAL | Status: DC

## 2022-05-06 MED ORDER — CEFAZOLIN SODIUM-DEXTROSE 2-3 GM-%(50ML) IV SOLR
INTRAVENOUS | Status: DC | PRN
  Administered 2022-05-06: 2 g via INTRAVENOUS

## 2022-05-06 MED ORDER — THROMBIN 5000 UNITS EX SOLR
OROMUCOSAL | Status: DC | PRN
  Administered 2022-05-06: 5 mL

## 2022-05-06 MED ORDER — 0.9 % SODIUM CHLORIDE (POUR BTL) OPTIME
TOPICAL | Status: DC | PRN
  Administered 2022-05-06: 1000 mL

## 2022-05-06 MED ORDER — GLYCOPYRROLATE PF 0.2 MG/ML IJ SOSY
PREFILLED_SYRINGE | INTRAMUSCULAR | Status: DC | PRN
  Administered 2022-05-06: .1 mg via INTRAVENOUS

## 2022-05-06 MED ORDER — ACETAMINOPHEN 650 MG RE SUPP: 650.0000 mg | RECTAL | Status: DC | PRN

## 2022-05-06 MED ORDER — CEFAZOLIN SODIUM-DEXTROSE 2-4 GM/100ML-% IV SOLN
INTRAVENOUS | Status: AC
  Filled 2022-05-06: qty 100

## 2022-05-06 MED ORDER — ONDANSETRON HCL 4 MG PO TABS: 4.0000 mg | ORAL_TABLET | Freq: Four times a day (QID) | ORAL | Status: DC | PRN

## 2022-05-06 MED ORDER — ATORVASTATIN CALCIUM 10 MG PO TABS
20.0000 mg | ORAL_TABLET | Freq: Every evening | ORAL | Status: DC
  Administered 2022-05-06: 20 mg via ORAL
  Filled 2022-05-06: qty 2

## 2022-05-06 MED ORDER — PANTOPRAZOLE SODIUM 40 MG PO TBEC
40.0000 mg | DELAYED_RELEASE_TABLET | Freq: Two times a day (BID) | ORAL | Status: DC
  Administered 2022-05-06 – 2022-05-07 (×3): 40 mg via ORAL
  Filled 2022-05-06 (×3): qty 1

## 2022-05-06 SURGICAL SUPPLY — 54 items
ADH SKN CLS APL DERMABOND .7 (GAUZE/BANDAGES/DRESSINGS) ×1
BAG COUNTER SPONGE SURGICOUNT (BAG) ×2 IMPLANT
BAG SPNG CNTER NS LX DISP (BAG) ×1
BAND INSRT 18 STRL LF DISP RB (MISCELLANEOUS)
BAND RUBBER #18 3X1/16 STRL (MISCELLANEOUS) IMPLANT
BIT DRILL ACP 15 (DRILL) IMPLANT
BIT DRILL NEURO 2X3.1 SFT TUCH (MISCELLANEOUS) ×2 IMPLANT
BNDG GAUZE DERMACEA FLUFF 4 (GAUZE/BANDAGES/DRESSINGS) IMPLANT
BNDG GZE DERMACEA 4 6PLY (GAUZE/BANDAGES/DRESSINGS)
BUR BARREL STRAIGHT FLUTE 4.0 (BURR) IMPLANT
CAGE CERV MOD 7X17X14 7D (Cage) IMPLANT
CANISTER SUCT 3000ML PPV (MISCELLANEOUS) ×2 IMPLANT
COVERAGE SUPPORT O-ARM STEALTH (MISCELLANEOUS) IMPLANT
DERMABOND ADVANCED .7 DNX12 (GAUZE/BANDAGES/DRESSINGS) ×2 IMPLANT
DRAPE LAPAROTOMY 100X72 PEDS (DRAPES) ×2 IMPLANT
DRAPE MICROSCOPE SLANT 54X150 (MISCELLANEOUS) IMPLANT
DRILL ACP 15 (DRILL) ×1
DRILL NEURO 2X3.1 SOFT TOUCH (MISCELLANEOUS) ×1
DRSG OPSITE 4X5.5 SM (GAUZE/BANDAGES/DRESSINGS) IMPLANT
DURAPREP 6ML APPLICATOR 50/CS (WOUND CARE) ×2 IMPLANT
ELECT COATED BLADE 2.86 ST (ELECTRODE) ×2 IMPLANT
ELECT REM PT RETURN 9FT ADLT (ELECTROSURGICAL) ×1
ELECTRODE REM PT RTRN 9FT ADLT (ELECTROSURGICAL) ×2 IMPLANT
FEE COVERAGE SUPPORT O-ARM (MISCELLANEOUS) IMPLANT
GAUZE 4X4 16PLY ~~LOC~~+RFID DBL (SPONGE) IMPLANT
GLOVE BIOGEL PI IND STRL 8.5 (GLOVE) ×2 IMPLANT
GLOVE ECLIPSE 7.5 STRL STRAW (GLOVE) IMPLANT
GLOVE ECLIPSE 8.5 STRL (GLOVE) ×2 IMPLANT
GLOVE SURG SS PI 6.5 STRL IVOR (GLOVE) IMPLANT
GOWN STRL REUS W/ TWL LRG LVL3 (GOWN DISPOSABLE) IMPLANT
GOWN STRL REUS W/ TWL XL LVL3 (GOWN DISPOSABLE) ×2 IMPLANT
GOWN STRL REUS W/TWL 2XL LVL3 (GOWN DISPOSABLE) ×2 IMPLANT
GOWN STRL REUS W/TWL LRG LVL3 (GOWN DISPOSABLE)
GOWN STRL REUS W/TWL XL LVL3 (GOWN DISPOSABLE) ×1
HALTER HD/CHIN CERV TRACTION D (MISCELLANEOUS) ×2 IMPLANT
HEMOSTAT POWDER KIT SURGIFOAM (HEMOSTASIS) ×2 IMPLANT
KIT BASIN OR (CUSTOM PROCEDURE TRAY) ×2 IMPLANT
NDL SPNL 22GX3.5 QUINCKE BK (NEEDLE) ×2 IMPLANT
NEEDLE HYPO 22GX1.5 SAFETY (NEEDLE) ×2 IMPLANT
NEEDLE SPNL 22GX3.5 QUINCKE BK (NEEDLE) ×1 IMPLANT
NS IRRIG 1000ML POUR BTL (IV SOLUTION) ×2 IMPLANT
PACK LAMINECTOMY NEURO (CUSTOM PROCEDURE TRAY) ×2 IMPLANT
PAD ARMBOARD 7.5X6 YLW CONV (MISCELLANEOUS) ×6 IMPLANT
PATTIES SURGICAL .5 X1 (DISPOSABLE) ×2 IMPLANT
PLATE ACP 1-LEVEL 1.6V20 (Plate) IMPLANT
PUTTY DBM PROPEL SM (Putty) IMPLANT
SCREW ACP VA ST 3.5X15 (Screw) IMPLANT
SET WALTER ACTIVATION W/DRAPE (SET/KITS/TRAYS/PACK) ×2 IMPLANT
SPIKE FLUID TRANSFER (MISCELLANEOUS) ×2 IMPLANT
SPONGE INTESTINAL PEANUT (DISPOSABLE) ×2 IMPLANT
SUT VIC AB 4-0 RB1 18 (SUTURE) ×4 IMPLANT
TOWEL GREEN STERILE (TOWEL DISPOSABLE) ×2 IMPLANT
TOWEL GREEN STERILE FF (TOWEL DISPOSABLE) ×2 IMPLANT
WATER STERILE IRR 1000ML POUR (IV SOLUTION) ×2 IMPLANT

## 2022-05-06 NOTE — Op Note (Signed)
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 Postoperative diagnosis: Same Procedure: Anterior cervical decompression C5-C6 arthrodesis with titanium structural spacer and demineralized bone matrix allograft.  Anterior plate fixation Y1-E5 with ACP plate. Surgeon: Kristeen Miss First Assistant: Duffy Rhody, MD Anesthesia: General endotracheal Indications: Siddarth Hsiung is a 61 year old individual who has had significant neck shoulder and left arm pain for about 6 months time he has evidence of significant disc degeneration at the C5-C6 level with foraminal stenosis and the subligamentous disc herniation.  He has been advised regarding the need for surgical decompression arthrodesis having failed extensive efforts at conservative management.  Procedure the patient was brought to the operating room supine on the stretcher.  After the smooth induction of general tracheal anesthesia 5 pounds of halter traction were applied and the neck was prepped with alcohol DuraPrep and draped in a sterile fashion.  The shoulders were taped inferiorly.  A transverse incision was made in the midportion of the neck.  Dissection was carried down through the platysma to expose the omohyoid muscle and just cephalad to the omohyoid muscle the prevertebral space was entered and the first disc space was identified at C5-6 positively on a radiograph.  Then by applying a self-retaining Caspari type retractor under the longus coli muscle on either side the disc space was opened with a 15 blade combination of curettes and rongeurs were used to evacuate a substantial quantity of severely degenerated desiccated disc material ventral osteophytes were drilled down with a 1.5 mm drill bit.  The disc space was then opened further by using a self-retaining disc spreader and by working on the contralateral side were able to evacuate disc down to the  uncinate process on the right and ultimately on the left.  The uncinate process was noted to be moderately enlarged and this was drilled down with that 1.5 mm drill bit the area then was explored and there was noted to be subligamentous disc material at the left side at C5-6 this was carefully removed and the ligament was opened and the path for the C6 nerve root was cleared way out into the foramen.  Hemostasis was easily achieved in this area.  The same process was then completed on the right side.  Once a good decompression was obtained the endplates were smoothed using a high-speed barrel bit and the space was sized for an appropriate size titanium spacer.  A modulus 14 x 7 teen by 7 mm tall spacer with 7 degrees lordosis fit best into this interval this was filled with demineralized bone matrix and placed into the interval.  An anterior plate was then affixed placing a 20 mm plate with 631 mm variable angle screws during all this procedure Dr. Marcello Moores help with retraction and exposure while I worked in the disc base.  Once this spacer was placed and the screws were tightened to final radiograph identified good position of surgical construct.  Then the prevertebral tissues were checked for hemostasis and when verified the platysma was closed with 4-0 Vicryl in interrupted fashion and 4-0 Vicryl was used in the subcuticular skin.  Dermabond was used on the skin.  Blood loss is estimated at less than 25 cc.  Patient was returned to recovery room in stable condition.

## 2022-05-06 NOTE — Anesthesia Preprocedure Evaluation (Addendum)
Anesthesia Evaluation  Patient identified by MRN, date of birth, ID band Patient awake    Reviewed: Allergy & Precautions, NPO status , Patient's Chart, lab work & pertinent test results  Airway Mallampati: II  TM Distance: >3 FB Neck ROM: Full    Dental  (+) Teeth Intact, Dental Advisory Given   Pulmonary Current Smoker and Patient abstained from smoking.,    breath sounds clear to auscultation       Cardiovascular  Rhythm:Regular Rate:Normal     Neuro/Psych    GI/Hepatic GERD  Medicated,  Endo/Other    Renal/GU      Musculoskeletal   Abdominal Normal abdominal exam  (+)   Peds  Hematology   Anesthesia Other Findings   Reproductive/Obstetrics                            Anesthesia Physical Anesthesia Plan  ASA: 2  Anesthesia Plan: General   Post-op Pain Management:    Induction: Intravenous  PONV Risk Score and Plan: 2 and Ondansetron, Dexamethasone and Midazolam  Airway Management Planned: Oral ETT and Video Laryngoscope Planned  Additional Equipment: None  Intra-op Plan:   Post-operative Plan: Extubation in OR  Informed Consent: I have reviewed the patients History and Physical, chart, labs and discussed the procedure including the risks, benefits and alternatives for the proposed anesthesia with the patient or authorized representative who has indicated his/her understanding and acceptance.     Dental advisory given  Plan Discussed with: CRNA  Anesthesia Plan Comments:        Anesthesia Quick Evaluation

## 2022-05-06 NOTE — Transfer of Care (Signed)
Immediate Anesthesia Transfer of Care Note  Patient: Marcus Hayes  Procedure(s) Performed: C5-6 ACDF (Spine Cervical)  Patient Location: PACU  Anesthesia Type:General  Level of Consciousness: awake and alert   Airway & Oxygen Therapy: Patient Spontanous Breathing  Post-op Assessment: Report given to RN, Post -op Vital signs reviewed and stable, and Patient moving all extremities X 4  Post vital signs: Reviewed and stable  Last Vitals:  Vitals Value Taken Time  BP 156/94 05/06/22 0943  Temp    Pulse 93 05/06/22 0945  Resp 15 05/06/22 0945  SpO2 93 % 05/06/22 0945  Vitals shown include unvalidated device data.  Last Pain:  Vitals:   05/06/22 0630  TempSrc:   PainSc: 0-No pain      Patients Stated Pain Goal: 3 (65/[id number])  Complications: No notable events documented.

## 2022-05-06 NOTE — Anesthesia Procedure Notes (Signed)
Procedure Name: Intubation Date/Time: 05/06/2022 8:05 AM  Performed by: Erick Colace, CRNAPre-anesthesia Checklist: Patient identified, Emergency Drugs available, Suction available and Patient being monitored Patient Re-evaluated:Patient Re-evaluated prior to induction Oxygen Delivery Method: Circle system utilized Preoxygenation: Pre-oxygenation with 100% oxygen Induction Type: IV induction Ventilation: Mask ventilation without difficulty Laryngoscope Size: Glidescope and 4 Grade View: Grade I Tube type: Oral Tube size: 7.5 mm Number of attempts: 1 Airway Equipment and Method: Stylet and Oral airway Placement Confirmation: ETT inserted through vocal cords under direct vision, positive ETCO2 and breath sounds checked- equal and bilateral Secured at: 22 cm Tube secured with: Tape Dental Injury: Teeth and Oropharynx as per pre-operative assessment

## 2022-05-06 NOTE — Anesthesia Postprocedure Evaluation (Signed)
Anesthesia Post Note  Patient: Marcus Hayes  Procedure(s) Performed: C5-6 ACDF (Spine Cervical)     Patient location during evaluation: PACU Anesthesia Type: General Level of consciousness: awake and alert Pain management: pain level controlled Vital Signs Assessment: post-procedure vital signs reviewed and stable Respiratory status: spontaneous breathing, nonlabored ventilation, respiratory function stable and patient connected to nasal cannula oxygen Cardiovascular status: blood pressure returned to baseline and stable Postop Assessment: no apparent nausea or vomiting Anesthetic complications: no   No notable events documented.  Last Vitals:  Vitals:   05/06/22 1047 05/06/22 1553  BP: (!) 157/88 (!) 150/85  Pulse: 73 78  Resp: 18 18  Temp: 37 C 36.5 C  SpO2: 97% 97%    Last Pain:  Vitals:   05/06/22 1555  TempSrc:   PainSc: 10-Worst pain ever                 Effie Berkshire

## 2022-05-06 NOTE — H&P (Signed)
CHIEF COMPLAINT: Neck, shoulder, left arm pain and weakness, and low back pain with neurogenic claudication.  HISTORY OF PRESENT ILLNESS: Marcus Hayes is a 61 year old right-handed individual whom I have seen in the past.  I treated him with some lumbar epidural steroid injections a few years ago, but he notes that he did not feel much relief from the injections and did not come back.  He has been having chronic back pain since that time and continues to do so.  Here more recently, he is having some knee problems on the left side, and he tells me that he had a fall in February of this year.  Since the time of the fall, he has had worsening back pain, leg pain, in addition to neck and shoulder pain with radiation of pain into the left arm and numbness into the hand on that left side including particularly the thumb.  He feels that his arm may not be quite as strong as it was, but he notes that he is having all these problems and he has been seen and followed by Dr. Fredonia Highland downstairs.  Dr. Percell Miller has been doctoring on his knee particularly, and Dr. Wolfgang Phoenix has ordered some MRIs of the cervical and lumbar spines.  He is seen now for further workup and evaluation of those problems.  PHYSICAL EXAMINATION: I note that the patient has a fairly limited range of motion, turning 45 degrees left and right.  There is some tenderness in the supraclavicular fossa.  His motor strength is good in the deltoids, the triceps, the biceps, but the wrist extensor and grip on the left hand is weak to 4/5 compared to the right side.  Intrinsic muscle function appears to be intact bilaterally.  There is a positive Spurling test on the patient when he flexes forward.  In his lower lumbar spine, I note that his tone and bulk in the major muscle groups are intact.  His station and gait are also intact.  IMAGING: Review of the MRI of the cervical spine demonstrates first that he has a herniated nucleus pulposus at C5-6 eccentric to  the left side.  The foramen is markedly narrowed on the left side for the C6 nerve root.  Clinically, the patient is having C6 nerve root symptoms.  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  This appears to have worsened some from his studies that he had done in 2018.  The stenosis still, though, appears to be moderate, albeit the radiologist reads it out as severe, particularly at L4-5.  There is a moderate amount of facet hypertrophy.  The alignment is good in the neutral position.  IMPRESSION: My impression about [name] is that he has a left C6 radiculopathy that has now been ongoing for nearly 6 months' time.  In light of the fact that it is not really getting any better and he does have some weakness on that left side, I discussed with him an anterior decompression arthrodesis at the C5-6 level.  I explained that this surgery typically is done on an outpatient basis.  Patients typically will feel significant improvement fairly early on from the left cervical radiculopathy.  They can feel as though they have some swallowing issues and a lump in their throat, but generally the surgery does alleviate the problem very nicely.  I believe that this would be an appropriate consideration for his  condition.   As regards his lumbar spine, I noted to [name] that the condition there is a little bit more complicated.  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  I noted that consideration of surgical decompression and/or arthrodesis may come to pass.  However, I am concerned about 2 other issues that are significant in his overall well-being.  One is his weight status.  He does have a BMI over 30.  It is noted to be 36 today in the office.  I discussed the importance of weight control in terms of mediating the back.  The 2nd issue that I am concerned about is that he likely has some sleep  apnea, and this can affect his overall ability to relax his back muscles when he is trying to sleep.  I have suggested that we have him in contact with Dr. Gerda Diss to explore this further.  His wife feels that he probably does have some sleep apnea, and this may be, in fact, the case.  It is interesting to note the number of times that patients will have their sleep apnea treated and note significant improvement in the status of their back functionally.  I would certainly suggest that we have him look at this in the meantime.  Otherwise, we can plan on scheduling his cervical intervention when he feels he is ready.

## 2022-05-07 ENCOUNTER — Ambulatory Visit: Payer: 59 | Admitting: Family Medicine

## 2022-05-07 ENCOUNTER — Other Ambulatory Visit: Payer: Self-pay

## 2022-05-07 ENCOUNTER — Other Ambulatory Visit (HOSPITAL_COMMUNITY): Payer: Self-pay

## 2022-05-07 DIAGNOSIS — M4722 Other spondylosis with radiculopathy, cervical region: Secondary | ICD-10-CM | POA: Diagnosis not present

## 2022-05-07 MED ORDER — HYDROCODONE-ACETAMINOPHEN 10-325 MG PO TABS
1.0000 | ORAL_TABLET | ORAL | 0 refills | Status: DC | PRN
  Filled 2022-05-07 (×2): qty 60, 5d supply, fill #0

## 2022-05-07 MED ORDER — METHOCARBAMOL 500 MG PO TABS
500.0000 mg | ORAL_TABLET | Freq: Four times a day (QID) | ORAL | 3 refills | Status: DC | PRN
  Filled 2022-05-07: qty 40, 10d supply, fill #0

## 2022-05-07 NOTE — Progress Notes (Signed)
Pt doing well. Pt given D/C instructions with verbal understanding. Rx's were sent to the pharmacy by MD. Pt's incision is clean and dry with no sign of infection. Pt's IV was removed prior to D/C. Pt D/C'd home via wheelchair per MD order. Pt is stable @ D/C and has no other needs at this time. Ramina Hulet, RN  

## 2022-05-07 NOTE — Evaluation (Signed)
Occupational Therapy Evaluation Patient Details Name: Marcus Hayes MRN: [id number] DOB: [birth year] Today's Date: 05/07/2022   History of Present Illness 61 yo male s/p 10/3 ACDF C5-6 PMH L knee surg   Clinical Impression   Patient evaluated by Occupational Therapy with no further acute OT needs identified. All education has been completed and the patient has no further questions. See below for any follow-up Occupational Therapy or equipment needs. OT to sign off. Thank you for referral.        Recommendations for follow up therapy are one component of a multi-disciplinary discharge planning process, led by the attending physician.  Recommendations may be updated based on patient status, additional functional criteria and insurance authorization.   Follow Up Recommendations  No OT follow up    Assistance Recommended at Discharge None  Patient can return home with the following Assist for transportation    Functional Status Assessment  Patient has had a recent decline in their functional status and demonstrates the ability to make significant improvements in function in a reasonable and predictable amount of time.  Equipment Recommendations  None recommended by OT    Recommendations for Other Services       Precautions / Restrictions Precautions Precautions: Cervical Precaution Comments: handout provided for cervical precautions Required Braces or Orthoses: Cervical Brace Cervical Brace: Soft collar;For comfort      Mobility Bed Mobility Overal bed mobility: Independent                  Transfers Overall transfer level: Independent                        Balance                                           ADL either performed or assessed with clinical judgement   ADL Overall ADL's : Modified independent                                       General ADL Comments: don doff soft collar delivered to room   Cervical precautions ( handout provided): Educated patient on don doff brace with return demonstration, educated on oral care using cups, washing face with cloth, never to wash directly on incision site, avoid neck rotation flexion and extension, positioning with pillows in chair for bil UE, sleeping positioning, avoiding pushing / pulling with bil UE, Pt educated on need to notify doctor / RN of swallowing changes or choking..     Vision Baseline Vision/History: 0 No visual deficits Patient Visual Report: No change from baseline       Perception     Praxis      Pertinent Vitals/Pain Pain Assessment Pain Assessment: 0-10 Pain Score: 5  Pain Descriptors / Indicators: Operative site guarding Pain Intervention(s): Monitored during session, Repositioned, Limited activity within patient's tolerance, Patient requesting pain meds-RN notified     Hand Dominance Right   Extremity/Trunk Assessment Upper Extremity Assessment Upper Extremity Assessment: LUE deficits/detail LUE Deficits / Details: small tip of thumb with numbness but reports alot of improvement.   Lower Extremity Assessment Lower Extremity Assessment: Overall WFL for tasks assessed   Cervical / Trunk Assessment Cervical / Trunk Assessment: Neck Surgery   Communication Communication Communication: No difficulties  Cognition Arousal/Alertness: Awake/alert Behavior During Therapy: WFL for tasks assessed/performed Overall Cognitive Status: Within Functional Limits for tasks assessed                                       General Comments  no dressing open to air at this time    Exercises     Shoulder Instructions      Home Living Family/patient expects to be discharged to:: Private residence Living Arrangements: Spouse/significant other Available Help at Discharge: Family;Available PRN/intermittently Type of Home: House Home Access: Stairs to enter Entrance Stairs-Number of Steps: 1 Entrance  Stairs-Rails: None Home Layout: One level     Bathroom Shower/Tub: Chief Strategy Officer: Handicapped height     Home Equipment: Agricultural consultant (2 wheels);Rollator (4 wheels);Standard Walker;Cane - single point;Tub bench;Hand held shower head;Electric scooter;Hospital bed   Additional Comments: wife on disability with limitations to (A). no animals in the home      Prior Functioning/Environment Prior Level of Function : Driving;Independent/Modified Independent                        OT Problem List:        OT Treatment/Interventions:      OT Goals(Current goals can be found in the care plan section) Acute Rehab OT Goals Patient Stated Goal: none stated Potential to Achieve Goals: Good  OT Frequency:      Co-evaluation              AM-PAC OT "6 Clicks" Daily Activity     Outcome Measure Help from another person eating meals?: None Help from another person taking care of personal grooming?: None Help from another person toileting, which includes using toliet, bedpan, or urinal?: None Help from another person bathing (including washing, rinsing, drying)?: None Help from another person to put on and taking off regular upper body clothing?: None Help from another person to put on and taking off regular lower body clothing?: None 6 Click Score: 24   End of Session Nurse Communication: Mobility status;Precautions  Activity Tolerance: Patient tolerated treatment well Patient left: in bed;with call bell/phone within reach  OT Visit Diagnosis: Unsteadiness on feet (R26.81)                Time: 2993-7169 OT Time Calculation (min): 17 min Charges:  OT General Charges $OT Visit: 1 Visit OT Evaluation $OT Eval Moderate Complexity: 1 Mod   Brynn, OTR/L  Acute Rehabilitation Services Office: [phone] .   Mateo Flow 05/07/2022, 9:54 AM

## 2022-05-07 NOTE — Discharge Summary (Signed)
Physician Discharge Summary  Patient ID: Marcus Hayes MRN: [id number] DOB/AGE: 1960/09/28 61 y.o.  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  Consults: None  Significant Diagnostic Studies: None  Treatments: surgery: See op note  Discharge Exam: Blood pressure (!) 145/91, pulse 86, temperature 98.1 F (36.7 C), temperature source Oral, resp. rate 20, height 6' (1.829 m), weight 113.4 kg, SpO2 97 %. Incision is clean and dry Station and gait are intact  Disposition: Discharge disposition: 01-Home or Self Care       Discharge Instructions     Call MD for:  redness, tenderness, or signs of infection (pain, swelling, redness, odor or green/yellow discharge around incision site)   Complete by: As directed    Call MD for:  severe uncontrolled pain   Complete by: As directed    Call MD for:  temperature >100.4   Complete by: As directed    Diet - low sodium heart healthy   Complete by: As directed    Discharge wound care:   Complete by: As directed    Okay to shower. Do not apply salves or appointments to incision. No heavy lifting with the upper extremities greater than 15 pounds. May resume driving when not requiring pain medication and patient feels comfortable with doing so.   Incentive spirometry RT   Complete by: As directed    Increase activity slowly   Complete by: As directed       Allergies as of 05/07/2022   No Known Allergies      Medication List     TAKE these medications    allopurinol 300 MG tablet Commonly known as: ZYLOPRIM TAKE 1 TABLET BY MOUTH ONCE A DAY FOR GOUT PREVENTION What changed:  how much to  take how to take this when to take this   aspirin EC 81 MG tablet Take 1 tablet (81 mg total) by mouth 2 (two) times daily. For DVT prophylaxis for 30 days after surgery. What changed: additional instructions   atorvastatin 20 MG tablet Commonly known as: LIPITOR TAKE 1 TABLET BY MOUTH EVERY EVENING What changed: how much to take   Calcium 500 + D3 500-15 MG-MCG Tabs Generic drug: Calcium Carb-Cholecalciferol Take 1 tablet by mouth twice daily   D-5000 125 MCG (5000 UT) Tabs Generic drug: Cholecalciferol Take 1 tablet by mouth once a day   HYDROcodone-acetaminophen 10-325 MG tablet Commonly known as: NORCO Take 1-2 tablets by mouth every 4 (four) hours as needed for severe pain or moderate pain. What changed:  how much to take how to take this when to take this reasons to take this additional instructions   lidocaine 5 % Commonly known as: Lidoderm Apply 1 patch to skin as directed   meloxicam 15 MG tablet Commonly known as: MOBIC Take 1 tablet by mouth once a day with meals for two weeks then as needed   methocarbamol 500 MG tablet Commonly known as: ROBAXIN Take 1 tablet (500 mg total) by mouth every 6 (six) hours as needed for muscle spasms.   pantoprazole 40 MG tablet Commonly known as: PROTONIX TAKE 1 TABLET BY MOUTH TWO TIMES DAILY What changed: how much to take  Discharge Care Instructions  (From admission, onward)           Start     Ordered   05/07/22 0000  Discharge wound care:       Comments: Okay to shower. Do not apply salves or appointments to incision. No heavy lifting with the upper extremities greater than 15 pounds. May resume driving when not requiring pain medication and patient feels comfortable with doing so.   05/07/22 0912             Signed: Blanchie Dessert Reice Bienvenue 05/07/2022, 9:12 AM

## 2022-05-07 NOTE — Progress Notes (Signed)
PT Cancellation Note and Discharge  Patient Details Name: Marcus Hayes MRN: [id number] DOB: November 05, 1960   Cancelled Treatment:    Reason Eval/Treat Not Completed: PT screened, no needs identified, will sign off. Discussed pt case with OT who reports pt is currently mobilizing without assistance and does not require a formal PT evaluation at this time. PT signing off. If needs change, please reconsult.     Thelma Comp 05/07/2022, 9:49 AM  Rolinda Roan, PT, DPT Acute Rehabilitation Services Secure Chat Preferred Office: [phone]

## 2022-05-07 NOTE — Progress Notes (Signed)
Orthopedic Tech Progress Note Patient Details:  Marcus Hayes [birth year] [id number]  Ortho Devices Type of Ortho Device: Soft collar Ortho Device/Splint Location: NECK Ortho Device/Splint Interventions: Ordered, Application   Post Interventions Patient Tolerated: Well Instructions Provided: [facility] [name] 05/07/2022, 9:14 AM

## 2022-05-10 MED ORDER — HYDROCODONE-ACETAMINOPHEN 10-325 MG PO TABS
ORAL_TABLET | ORAL | 0 refills | Status: DC
  Filled 2022-05-10: qty 150, fill #0

## 2022-05-10 MED ORDER — HYDROCODONE-ACETAMINOPHEN 10-325 MG PO TABS
ORAL_TABLET | ORAL | 0 refills | Status: DC
  Filled 2022-05-10: qty 150, fill #0
  Filled 2022-07-24: qty 150, 30d supply, fill #0

## 2022-05-10 MED ORDER — HYDROCODONE-ACETAMINOPHEN 10-325 MG PO TABS
ORAL_TABLET | ORAL | 0 refills | Status: DC
  Filled 2022-05-10: qty 150, fill #0
  Filled 2022-06-04: qty 150, 30d supply, fill #0

## 2022-05-11 ENCOUNTER — Encounter (HOSPITAL_COMMUNITY): Payer: Self-pay | Admitting: Neurological Surgery

## 2022-05-12 ENCOUNTER — Other Ambulatory Visit (HOSPITAL_COMMUNITY): Payer: Self-pay

## 2022-05-23 ENCOUNTER — Other Ambulatory Visit (HOSPITAL_COMMUNITY): Payer: Self-pay

## 2022-05-23 MED ORDER — METHOCARBAMOL 500 MG PO TABS
500.0000 mg | ORAL_TABLET | Freq: Four times a day (QID) | ORAL | 1 refills | Status: DC
  Filled 2022-05-23: qty 60, 15d supply, fill #0
  Filled 2022-08-28 (×2): qty 60, 15d supply, fill #1

## 2022-05-23 MED ORDER — HYDROCODONE-ACETAMINOPHEN 10-325 MG PO TABS
1.0000 | ORAL_TABLET | ORAL | 0 refills | Status: DC | PRN
  Filled 2022-05-23: qty 30, 5d supply, fill #0

## 2022-05-26 ENCOUNTER — Other Ambulatory Visit (HOSPITAL_COMMUNITY): Payer: Self-pay

## 2022-06-04 ENCOUNTER — Other Ambulatory Visit (HOSPITAL_COMMUNITY): Payer: Self-pay

## 2022-06-04 DIAGNOSIS — S83242D Other tear of medial meniscus, current injury, left knee, subsequent encounter: Secondary | ICD-10-CM | POA: Diagnosis not present

## 2022-06-04 MED ORDER — LIDOCAINE 4 % EX PTCH
1.0000 | MEDICATED_PATCH | Freq: Every day | CUTANEOUS | 2 refills | Status: DC | PRN
  Filled 2022-06-04: qty 30, 30d supply, fill #0

## 2022-06-05 ENCOUNTER — Other Ambulatory Visit (HOSPITAL_COMMUNITY): Payer: Self-pay

## 2022-06-11 ENCOUNTER — Other Ambulatory Visit (HOSPITAL_COMMUNITY): Payer: Self-pay

## 2022-06-24 ENCOUNTER — Encounter (HOSPITAL_COMMUNITY): Payer: Self-pay | Admitting: Neurological Surgery

## 2022-07-14 ENCOUNTER — Ambulatory Visit (INDEPENDENT_AMBULATORY_CARE_PROVIDER_SITE_OTHER): Payer: Self-pay | Admitting: Family Medicine

## 2022-07-14 ENCOUNTER — Other Ambulatory Visit (HOSPITAL_COMMUNITY): Payer: Self-pay

## 2022-07-14 ENCOUNTER — Encounter: Payer: Self-pay | Admitting: Family Medicine

## 2022-07-14 VITALS — BP 138/79 | Temp 97.7°F | Wt 260.4 lb

## 2022-07-14 DIAGNOSIS — J019 Acute sinusitis, unspecified: Secondary | ICD-10-CM

## 2022-07-14 MED ORDER — AMOXICILLIN-POT CLAVULANATE 875-125 MG PO TABS
1.0000 | ORAL_TABLET | Freq: Two times a day (BID) | ORAL | 0 refills | Status: DC
  Filled 2022-07-14: qty 20, 10d supply, fill #0

## 2022-07-14 NOTE — Progress Notes (Signed)
   Subjective:    Patient ID: Marcus Hayes, male    DOB: [birth year], 61 y.o.   MRN: [id number]  HPI  Patient presents today with respiratory illness Number of days present-4 days  Symptoms include- chest/nasal congestion, sore throat, cough and headache.   Presence of worrisome signs (severe shortness of breath, lethargy, etc.) - none  Recent/current visit to urgent care or ER- no  Recent direct exposure to Covid- none  Any current Covid testing-  no   Review of Systems     Objective:   Physical Exam  Gen-NAD not toxic TMS-normal bilateral T- normal no redness Chest-CTA respiratory rate normal no crackles CV RRR no murmur Skin-warm dry Neuro-grossly normal       Assessment & Plan:   Secondary rhinosinusitis-antibiotic prescribed warning signs discussed follow-up if progressive troubles or worse

## 2022-07-16 LAB — NOVEL CORONAVIRUS, NAA: SARS-CoV-2, NAA: NOT DETECTED

## 2022-07-24 ENCOUNTER — Other Ambulatory Visit (HOSPITAL_COMMUNITY): Payer: Self-pay

## 2022-08-12 ENCOUNTER — Ambulatory Visit (INDEPENDENT_AMBULATORY_CARE_PROVIDER_SITE_OTHER): Payer: 59 | Admitting: Family Medicine

## 2022-08-12 ENCOUNTER — Other Ambulatory Visit (HOSPITAL_COMMUNITY): Payer: Self-pay

## 2022-08-12 ENCOUNTER — Encounter: Payer: Self-pay | Admitting: Family Medicine

## 2022-08-12 VITALS — BP 136/82 | Wt 257.2 lb

## 2022-08-12 DIAGNOSIS — M5441 Lumbago with sciatica, right side: Secondary | ICD-10-CM

## 2022-08-12 DIAGNOSIS — E7849 Other hyperlipidemia: Secondary | ICD-10-CM

## 2022-08-12 DIAGNOSIS — G8929 Other chronic pain: Secondary | ICD-10-CM

## 2022-08-12 DIAGNOSIS — M4722 Other spondylosis with radiculopathy, cervical region: Secondary | ICD-10-CM

## 2022-08-12 DIAGNOSIS — Z9889 Other specified postprocedural states: Secondary | ICD-10-CM | POA: Diagnosis not present

## 2022-08-12 DIAGNOSIS — M5442 Lumbago with sciatica, left side: Secondary | ICD-10-CM | POA: Diagnosis not present

## 2022-08-12 DIAGNOSIS — J019 Acute sinusitis, unspecified: Secondary | ICD-10-CM

## 2022-08-12 DIAGNOSIS — Z79891 Long term (current) use of opiate analgesic: Secondary | ICD-10-CM | POA: Diagnosis not present

## 2022-08-12 MED ORDER — HYDROCODONE-ACETAMINOPHEN 10-325 MG PO TABS
1.0000 | ORAL_TABLET | ORAL | 0 refills | Status: DC | PRN
  Filled 2022-08-12 – 2022-11-05 (×4): qty 150, 25d supply, fill #0
  Filled 2022-11-08: qty 150, 30d supply, fill #0

## 2022-08-12 MED ORDER — HYDROCODONE-ACETAMINOPHEN 10-325 MG PO TABS
ORAL_TABLET | ORAL | 0 refills | Status: DC
  Filled 2022-08-12: qty 150, fill #0
  Filled 2022-08-26: qty 150, 30d supply, fill #0

## 2022-08-12 MED ORDER — DOXYCYCLINE HYCLATE 100 MG PO TABS
100.0000 mg | ORAL_TABLET | Freq: Two times a day (BID) | ORAL | 0 refills | Status: DC
  Filled 2022-08-12: qty 14, 7d supply, fill #0

## 2022-08-12 MED ORDER — HYDROCODONE-ACETAMINOPHEN 10-325 MG PO TABS
ORAL_TABLET | ORAL | 0 refills | Status: DC
  Filled 2022-08-12: qty 150, fill #0
  Filled 2022-10-06: qty 150, 25d supply, fill #0

## 2022-08-12 NOTE — Progress Notes (Signed)
Subjective:    Patient ID: Marcus Hayes, male    DOB: 1961/01/24, 62 y.o.   MRN: [id number]  HPI This patient was seen today for chronic pain  The medication list was reviewed and updated.  Location of Pain for which the patient has been treated with regarding narcotics: Patient has a lot of pain in his neck he has had previous surgeries also has pain in the lower back denies radiation down the legs  Onset of this pain: This been present for years has had recent surgery in the neck   -Compliance with medication: Hydrocodone 10-325  - Number patient states they take daily: 5  -when was the last dose patient took? 30-45 minutes ago   The patient was advised the importance of maintaining medication and not using illegal substances with these.  Here for refills and follow up  The patient was educated that we can provide 3 monthly scripts for their medication, it is their responsibility to follow the instructions.  Side effects or complications from medications: none  Patient is aware that pain medications are meant to minimize the severity of the pain to allow their pain levels to improve to allow for better function. They are aware of that pain medications cannot totally remove their pain.  Due for UDT ( at least once per year) : completed in August 2023  Scale of 1 to 10 ( 1 is least 10 is most) Your pain level without the medicine: 10 Your pain level with medication: 5/6  Scale 1 to 10 ( 1-helps very little, 10 helps very well) How well does your pain medication reduce your pain so you can function better through out the day? 8  Quality of the pain:   Persistence of the pain:   Modifying factors:   Pt requesting more antibiotics. Pt recently on Augmentin for sinuses. Pt states that yesterday was the first day out of house in over a week. Coughing up yellow colored mucus, diarrhea. No body aches, rib cage ache from cough. COVID test have been negative.      Chronic  low back pain with bilateral sciatica, unspecified back pain laterality  S/P left knee arthroscopy  Cervical spondylosis with radiculopathy  Other hyperlipidemia  Encounter for long-term opiate analgesic use    Review of Systems     Objective:   Physical Exam General-in no acute distress Eyes-no discharge Lungs-respiratory rate normal, CTA CV-no murmurs,RRR Extremities skin warm dry no edema Neuro grossly normal Behavior normal, alert        Assessment & Plan:  1. Chronic low back pain with bilateral sciatica, unspecified back pain laterality Stretching exercises show Patient has had previous specialist visit Previous MRI Has significant findings in the MRI Chronic pain This will not get better over time Stretching exercises along with medication would be the best route Further physical therapy injections or surgery would be unlikely to resolve his issue Patient states that he is given up doing any significant lifting twisting squatting bending because it aggravates his back is unable to use a riding lawnmower. 2. S/P left knee arthroscopy Patient ongoing left knee pain and discomfort will follow-up with orthopedics possibly to have knee replacement  3. Cervical spondylosis with radiculopathy Previous surgery did help him but he still has neck pain on the left side hurts with rotation to the left that as well as hand discomfort on the left side but no weakness  4. Other hyperlipidemia Patient has not done his lab work  as previously ordered we talked about the importance of getting his lab work done as well as healthy diet and continuing medications  5. Encounter for long-term opiate analgesic use The patient was seen in followup for chronic pain. A review over at their current pain status was discussed. Drug registry was checked. Prescriptions were given.  Regular follow-up recommended. Discussion was held regarding the importance of compliance with medication as  well as pain medication contract.  Patient was informed that medication may cause drowsiness and should not be combined  with other medications/alcohol or street drugs. If the patient feels medication is causing altered alertness then do not drive or operate dangerous equipment.  Should be noted that the patient appears to be meeting appropriate use of opioids and response.  Evidenced by improved function and decent pain control without significant side effects and no evidence of overt aberrancy issues.  Upon discussion with the patient today they understand that opioid therapy is optional and they feel that the pain has been refractory to reasonable conservative measures and is significant and affecting quality of life enough to warrant ongoing therapy and wishes to continue opioids.  Refills were provided.  Rhinosinusitis antibiotics prescribed

## 2022-08-18 DIAGNOSIS — E7849 Other hyperlipidemia: Secondary | ICD-10-CM | POA: Diagnosis not present

## 2022-08-18 DIAGNOSIS — Z125 Encounter for screening for malignant neoplasm of prostate: Secondary | ICD-10-CM | POA: Diagnosis not present

## 2022-08-18 DIAGNOSIS — Z79899 Other long term (current) drug therapy: Secondary | ICD-10-CM | POA: Diagnosis not present

## 2022-08-18 DIAGNOSIS — M1000 Idiopathic gout, unspecified site: Secondary | ICD-10-CM | POA: Diagnosis not present

## 2022-08-19 ENCOUNTER — Encounter: Payer: Self-pay | Admitting: Family Medicine

## 2022-08-19 LAB — CBC WITH DIFFERENTIAL/PLATELET
Basophils Absolute: 0.1 10*3/uL (ref 0.0–0.2)
Basos: 0 %
EOS (ABSOLUTE): 0.1 10*3/uL (ref 0.0–0.4)
Eos: 1 %
Hematocrit: 46.4 % (ref 37.5–51.0)
Hemoglobin: 15.3 g/dL (ref 13.0–17.7)
Immature Grans (Abs): 0.1 10*3/uL (ref 0.0–0.1)
Immature Granulocytes: 1 %
Lymphocytes Absolute: 2.8 10*3/uL (ref 0.7–3.1)
Lymphs: 25 %
MCH: 30.2 pg (ref 26.6–33.0)
MCHC: 33 g/dL (ref 31.5–35.7)
MCV: 92 fL (ref 79–97)
Monocytes Absolute: 1.1 10*3/uL — ABNORMAL HIGH (ref 0.1–0.9)
Monocytes: 10 %
Neutrophils Absolute: 7.3 10*3/uL — ABNORMAL HIGH (ref 1.4–7.0)
Neutrophils: 63 %
Platelets: 253 10*3/uL (ref 150–450)
RBC: 5.07 x10E6/uL (ref 4.14–5.80)
RDW: 12.9 % (ref 11.6–15.4)
WBC: 11.4 10*3/uL — ABNORMAL HIGH (ref 3.4–10.8)

## 2022-08-19 LAB — LIPID PANEL
Chol/HDL Ratio: 3.1 ratio (ref 0.0–5.0)
Cholesterol, Total: 185 mg/dL (ref 100–199)
HDL: 59 mg/dL (ref >39)
LDL Chol Calc (NIH): 94 mg/dL (ref 0–99)
Triglycerides: 191 mg/dL — ABNORMAL HIGH (ref 0–149)
VLDL Cholesterol Cal: 32 mg/dL (ref 5–40)

## 2022-08-19 LAB — HEPATIC FUNCTION PANEL
ALT: 20 IU/L (ref 0–44)
AST: 14 IU/L (ref 0–40)
Albumin: 4 g/dL (ref 3.8–4.9)
Alkaline Phosphatase: 122 IU/L — ABNORMAL HIGH (ref 44–121)
Bilirubin Total: 0.4 mg/dL (ref 0.0–1.2)
Bilirubin, Direct: 0.13 mg/dL (ref 0.00–0.40)
Total Protein: 7 g/dL (ref 6.0–8.5)

## 2022-08-19 LAB — BASIC METABOLIC PANEL
BUN/Creatinine Ratio: 15 (ref 10–24)
BUN: 14 mg/dL (ref 8–27)
CO2: 22 mmol/L (ref 20–29)
Calcium: 9.7 mg/dL (ref 8.6–10.2)
Chloride: 101 mmol/L (ref 96–106)
Creatinine, Ser: 0.93 mg/dL (ref 0.76–1.27)
Glucose: 104 mg/dL — ABNORMAL HIGH (ref 70–99)
Potassium: 5.2 mmol/L (ref 3.5–5.2)
Sodium: 139 mmol/L (ref 134–144)
eGFR: 94 mL/min/{1.73_m2} (ref >59)

## 2022-08-19 LAB — URIC ACID: Uric Acid: 4.6 mg/dL (ref 3.8–8.4)

## 2022-08-19 LAB — PSA: Prostate Specific Ag, Serum: 0.3 ng/mL (ref 0.0–4.0)

## 2022-08-19 NOTE — Progress Notes (Signed)
Please mail to the patient not sure he uses MyChart on a regular basis

## 2022-08-26 ENCOUNTER — Other Ambulatory Visit (HOSPITAL_COMMUNITY): Payer: Self-pay

## 2022-08-26 ENCOUNTER — Other Ambulatory Visit: Payer: Self-pay | Admitting: Family Medicine

## 2022-08-27 ENCOUNTER — Other Ambulatory Visit (HOSPITAL_COMMUNITY): Payer: Self-pay

## 2022-08-28 ENCOUNTER — Telehealth: Payer: Self-pay | Admitting: Family Medicine

## 2022-08-28 ENCOUNTER — Other Ambulatory Visit: Payer: Self-pay | Admitting: Family Medicine

## 2022-08-28 ENCOUNTER — Other Ambulatory Visit: Payer: Self-pay

## 2022-08-28 ENCOUNTER — Other Ambulatory Visit (HOSPITAL_COMMUNITY): Payer: Self-pay

## 2022-08-28 NOTE — Telephone Encounter (Signed)
FYI-patient calling to let you know the all [facility] pharmacies are out of hydrocodone 10/325  and they told him to get primary care doctor to prescribe different pain medication for now until this medication comes in about two to three weeks. [facility]

## 2022-08-28 NOTE — Telephone Encounter (Signed)
Please advise. Thank you

## 2022-08-29 ENCOUNTER — Other Ambulatory Visit (HOSPITAL_COMMUNITY): Payer: Self-pay

## 2022-08-30 ENCOUNTER — Other Ambulatory Visit (HOSPITAL_COMMUNITY): Payer: Self-pay

## 2022-09-01 ENCOUNTER — Other Ambulatory Visit (HOSPITAL_COMMUNITY): Payer: Self-pay

## 2022-09-01 NOTE — Telephone Encounter (Signed)
Pt is calling back for follow up pharmacy said pain meds are on back order till [facility] Feb

## 2022-09-01 NOTE — Telephone Encounter (Signed)
Please call [facility] pharmacy-call the pharmacy he uses Do they have any hydrocodone 5 mg?  7.5?  10 mg?

## 2022-09-01 NOTE — Telephone Encounter (Signed)
Please advise. Thank you

## 2022-09-02 ENCOUNTER — Other Ambulatory Visit: Payer: Self-pay

## 2022-09-02 ENCOUNTER — Other Ambulatory Visit (HOSPITAL_COMMUNITY): Payer: Self-pay

## 2022-09-02 ENCOUNTER — Other Ambulatory Visit: Payer: Self-pay | Admitting: Family Medicine

## 2022-09-02 MED ORDER — HYDROCODONE-ACETAMINOPHEN 10-325 MG PO TABS
1.0000 | ORAL_TABLET | ORAL | 0 refills | Status: DC | PRN
  Filled 2022-09-02: qty 150, 30d supply, fill #0

## 2022-09-02 MED ORDER — PANTOPRAZOLE SODIUM 40 MG PO TBEC
DELAYED_RELEASE_TABLET | Freq: Two times a day (BID) | ORAL | 1 refills | Status: DC
  Filled 2022-09-02: qty 60, 30d supply, fill #0

## 2022-09-02 MED ORDER — ALLOPURINOL 300 MG PO TABS
ORAL_TABLET | ORAL | 1 refills | Status: DC
  Filled 2022-09-02: qty 30, 30d supply, fill #0
  Filled 2022-10-31: qty 30, 30d supply, fill #1
  Filled 2022-12-01: qty 30, 30d supply, fill #2
  Filled 2023-01-07: qty 30, 30d supply, fill #3
  Filled 2023-02-06: qty 30, 30d supply, fill #4
  Filled 2023-03-05: qty 30, 30d supply, fill #5

## 2022-09-02 MED ORDER — ATORVASTATIN CALCIUM 20 MG PO TABS
ORAL_TABLET | Freq: Every evening | ORAL | 1 refills | Status: DC
  Filled 2022-09-02: qty 30, 30d supply, fill #0
  Filled 2022-10-31: qty 30, 30d supply, fill #1
  Filled 2022-11-08 – 2022-12-01 (×2): qty 30, 30d supply, fill #2
  Filled 2023-01-07: qty 30, 30d supply, fill #3
  Filled 2023-02-06: qty 30, 30d supply, fill #4
  Filled 2023-03-05: qty 30, 30d supply, fill #5

## 2022-09-02 NOTE — Telephone Encounter (Signed)
Pt contacted. Pt states that [facility] has them.[place] and verified that they do have 10/325 in stock. Please advise. Thank you

## 2022-09-02 NOTE — Telephone Encounter (Signed)
Please talk with patient, they do have 7.5 mg / 325 mg available, the 5 mg is not a solution because when we double up on that it would be too much acetaminophen To 7.5 has less of the hydrocodone than the 10 mg but I do not see any other viable alternative solution So there for or I would recommend a 7.5 mg / 325 mg we could do a 1 time prescription of 1 every 4 hours as needed pain If he is open to going with this for 1 prescription then hopefully the 10 mg would be back in stock then please let me know

## 2022-09-02 NOTE — Telephone Encounter (Signed)
Spoke with patient and advised per Dr Nicki Reaper Prescription sent there as requested. Patient verbalized understanding.

## 2022-09-02 NOTE — Telephone Encounter (Signed)
[facility] contacted- [facility] states they do have 5/325 and 7.5/325 mg. Please advise. Thank you

## 2022-09-02 NOTE — Telephone Encounter (Signed)
Prescription sent there as requested

## 2022-09-10 ENCOUNTER — Other Ambulatory Visit (HOSPITAL_COMMUNITY): Payer: Self-pay

## 2022-09-12 ENCOUNTER — Other Ambulatory Visit (HOSPITAL_COMMUNITY): Payer: Self-pay

## 2022-09-17 DIAGNOSIS — S83242D Other tear of medial meniscus, current injury, left knee, subsequent encounter: Secondary | ICD-10-CM | POA: Diagnosis not present

## 2022-09-18 ENCOUNTER — Other Ambulatory Visit (HOSPITAL_COMMUNITY): Payer: Self-pay

## 2022-09-18 MED ORDER — LIDOCAINE 5 % EX PTCH
MEDICATED_PATCH | CUTANEOUS | 0 refills | Status: DC
  Filled 2022-09-18 – 2022-11-05 (×3): qty 15, 15d supply, fill #0

## 2022-09-23 ENCOUNTER — Other Ambulatory Visit (HOSPITAL_COMMUNITY): Payer: Self-pay

## 2022-10-06 ENCOUNTER — Other Ambulatory Visit (HOSPITAL_COMMUNITY): Payer: Self-pay

## 2022-10-11 ENCOUNTER — Other Ambulatory Visit (HOSPITAL_COMMUNITY): Payer: Self-pay

## 2022-10-13 ENCOUNTER — Other Ambulatory Visit (HOSPITAL_COMMUNITY): Payer: Self-pay

## 2022-10-31 ENCOUNTER — Other Ambulatory Visit (HOSPITAL_COMMUNITY): Payer: Self-pay

## 2022-11-03 ENCOUNTER — Other Ambulatory Visit: Payer: Self-pay

## 2022-11-03 ENCOUNTER — Other Ambulatory Visit (HOSPITAL_COMMUNITY): Payer: Self-pay

## 2022-11-05 ENCOUNTER — Other Ambulatory Visit (HOSPITAL_COMMUNITY): Payer: Self-pay

## 2022-11-05 DIAGNOSIS — M25562 Pain in left knee: Secondary | ICD-10-CM | POA: Diagnosis not present

## 2022-11-05 MED ORDER — LIDOCAINE 5 % EX PTCH
MEDICATED_PATCH | CUTANEOUS | 2 refills | Status: DC
  Filled 2022-11-05: qty 30, 30d supply, fill #0

## 2022-11-08 ENCOUNTER — Other Ambulatory Visit (HOSPITAL_COMMUNITY): Payer: Self-pay

## 2022-11-10 ENCOUNTER — Other Ambulatory Visit (HOSPITAL_COMMUNITY): Payer: Self-pay

## 2022-11-10 MED ORDER — LIDOCAINE 5 % EX CREA: TOPICAL_CREAM | CUTANEOUS | 0 refills | Status: DC

## 2022-11-11 ENCOUNTER — Other Ambulatory Visit (HOSPITAL_COMMUNITY): Payer: Self-pay

## 2022-11-11 ENCOUNTER — Other Ambulatory Visit: Payer: Self-pay

## 2022-11-12 ENCOUNTER — Other Ambulatory Visit (HOSPITAL_COMMUNITY): Payer: Self-pay

## 2022-11-12 ENCOUNTER — Ambulatory Visit: Payer: 59 | Admitting: Family Medicine

## 2022-11-12 VITALS — BP 131/83 | HR 78 | Ht 72.0 in | Wt 240.6 lb

## 2022-11-12 DIAGNOSIS — M5441 Lumbago with sciatica, right side: Secondary | ICD-10-CM | POA: Diagnosis not present

## 2022-11-12 DIAGNOSIS — M4722 Other spondylosis with radiculopathy, cervical region: Secondary | ICD-10-CM

## 2022-11-12 DIAGNOSIS — Z9889 Other specified postprocedural states: Secondary | ICD-10-CM | POA: Diagnosis not present

## 2022-11-12 DIAGNOSIS — M5442 Lumbago with sciatica, left side: Secondary | ICD-10-CM | POA: Diagnosis not present

## 2022-11-12 DIAGNOSIS — G8929 Other chronic pain: Secondary | ICD-10-CM

## 2022-11-12 DIAGNOSIS — Z122 Encounter for screening for malignant neoplasm of respiratory organs: Secondary | ICD-10-CM

## 2022-11-12 MED ORDER — PANTOPRAZOLE SODIUM 40 MG PO TBEC
40.0000 mg | DELAYED_RELEASE_TABLET | Freq: Every day | ORAL | 1 refills | Status: DC
  Filled 2022-11-12 – 2023-03-05 (×2): qty 90, 90d supply, fill #0
  Filled 2023-04-02: qty 30, 30d supply, fill #0

## 2022-11-12 MED ORDER — HYDROCODONE-ACETAMINOPHEN 10-325 MG PO TABS
1.0000 | ORAL_TABLET | ORAL | 0 refills | Status: DC | PRN
  Filled 2022-11-12: qty 150, fill #0
  Filled 2023-02-06: qty 150, 30d supply, fill #0

## 2022-11-12 MED ORDER — HYDROCODONE-ACETAMINOPHEN 10-325 MG PO TABS
1.0000 | ORAL_TABLET | ORAL | 0 refills | Status: DC | PRN
  Filled 2022-11-12: qty 150, 25d supply, fill #0
  Filled 2022-12-08: qty 150, 30d supply, fill #0

## 2022-11-12 MED ORDER — HYDROCODONE-ACETAMINOPHEN 10-325 MG PO TABS
1.0000 | ORAL_TABLET | ORAL | 0 refills | Status: DC | PRN
  Filled 2022-11-12 – 2023-01-07 (×2): qty 150, 25d supply, fill #0

## 2022-11-12 NOTE — Progress Notes (Signed)
Subjective:    Patient ID: Marcus Hayes, male    DOB: Dec 22, 1960, 62 y.o.   MRN: [id number]  HPI This patient was seen today for chronic pain  The medication list was reviewed and updated.  Location of Pain for which the patient has been treated with regarding narcotics: Lower back, cervical spine, left knee  Onset of this pain: For many years but the knee more recently   -Compliance with medication: Good compliance with medicine  - Number patient states they take daily: 5 hydrocodone per day  -when was the last dose patient took? 1230 pm today  The patient was advised the importance of maintaining medication and not using illegal substances with these.  Here for refills and follow up  The patient was educated that we can provide 3 monthly scripts for their medication, it is their responsibility to follow the instructions.  Side effects or complications from medications: Denies side effects  Patient is aware that pain medications are meant to minimize the severity of the pain to allow their pain levels to improve to allow for better function. They are aware of that pain medications cannot totally remove their pain.  Due for UDT ( at least once per year) : 03/31/2022  Scale of 1 to 10 ( 1 is least 10 is most) Your pain level without the medicine: 8 Your pain level with medication 5  Scale 1 to 10 ( 1-helps very little, 10 helps very well) How well does your pain medication reduce your pain so you can function better through out the day? 7  Quality of the pain: Throbbing aching  Persistence of the pain: Relates all the time  Modifying factors: All of these areas are worse with activity  Patient counseled to quit smoking Patient counseled to continue to eat healthy portion control and follow lose weight       Review of Systems     Objective:   Physical Exam General-in no acute distress Eyes-no discharge Lungs-respiratory rate normal, CTA CV-no  murmurs,RRR Extremities skin warm dry no edema Neuro grossly normal Behavior normal, alert        Assessment & Plan:  1. Chronic low back pain with bilateral sciatica, unspecified back pain laterality The patient was seen in followup for chronic pain. A review over at their current pain status was discussed. Drug registry was checked. Prescriptions were given.  Regular follow-up recommended. Discussion was held regarding the importance of compliance with medication as well as pain medication contract.  Patient was informed that medication may cause drowsiness and should not be combined  with other medications/alcohol or street drugs. If the patient feels medication is causing altered alertness then do not drive or operate dangerous equipment.  Should be noted that the patient appears to be meeting appropriate use of opioids and response.  Evidenced by improved function and decent pain control without significant side effects and no evidence of overt aberrancy issues.  Upon discussion with the patient today they understand that opioid therapy is optional and they feel that the pain has been refractory to reasonable conservative measures and is significant and affecting quality of life enough to warrant ongoing therapy and wishes to continue opioids.  Refills were provided.   2. S/P left knee arthroscopy Patient going to have surgery in the near future pain management will be through orthopedics I warned him not to take hydrocodone with additional pain medicine if he needs our help to notify us  3. Cervical spondylosis with  radiculopathy See per above regarding pain management  Referral for lung cancer screening

## 2022-11-13 ENCOUNTER — Other Ambulatory Visit (HOSPITAL_COMMUNITY): Payer: Self-pay

## 2022-11-21 NOTE — Addendum Note (Signed)
Addended by: Margaretha Sheffield on: 11/21/2022 02:14 PM   Modules accepted: Orders

## 2022-12-01 ENCOUNTER — Other Ambulatory Visit (HOSPITAL_COMMUNITY): Payer: Self-pay

## 2022-12-08 ENCOUNTER — Other Ambulatory Visit (HOSPITAL_COMMUNITY): Payer: Self-pay

## 2022-12-08 ENCOUNTER — Other Ambulatory Visit: Payer: Self-pay

## 2022-12-10 ENCOUNTER — Other Ambulatory Visit: Payer: Self-pay | Admitting: Orthopaedic Surgery

## 2023-01-07 ENCOUNTER — Other Ambulatory Visit (HOSPITAL_COMMUNITY): Payer: Self-pay

## 2023-01-28 ENCOUNTER — Other Ambulatory Visit (HOSPITAL_COMMUNITY): Payer: 59
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
## 2023-02-03 NOTE — Patient Instructions (Addendum)
SURGICAL WAITING ROOM VISITATION  Patients having surgery or a procedure may have no more than 2 support people in the waiting area - these visitors may rotate.    Children under the age of 75 must have an adult with them who is not the patient.  Due to an increase in RSV and influenza rates and associated hospitalizations, children ages 63 and under may not visit patients in [facility] hospitals.  If the patient needs to stay at the hospital during part of their recovery, the visitor guidelines for inpatient rooms apply. Pre-op nurse will coordinate an appropriate time for 1 support person to accompany patient in pre-op.  This support person may not rotate.    Please refer to the [facility] website for the visitor guidelines for Inpatients (after your surgery is over and you are in a regular room).       Your procedure is scheduled on: 02/17/23   Report to [facility] Main Entrance    Report to admitting at 5:15 AM   Call this number if you have problems the morning of surgery [phone]   Do not eat food :After Midnight.   After Midnight you may have the following liquids until 4;30 AM DAY OF SURGERY  Water Non-Citrus Juices (without pulp, NO RED-Apple, White grape, White cranberry) Black Coffee (NO MILK/CREAM OR CREAMERS, sugar ok)  Clear Tea (NO MILK/CREAM OR CREAMERS, sugar ok) regular and decaf                             Plain Jell-O (NO RED)                                           Fruit ices (not with fruit pulp, NO RED)                                     Popsicles (NO RED)                                                               Sports drinks like Gatorade (NO RED)                The day of surgery:  Drink ONE (1) Pre-Surgery Clear Ensure at 4:30 AM the morning of surgery. Drink in one sitting. Do not sip.  This drink was given to you during your hospital  pre-op appointment visit. Nothing else to drink after completing the  Pre-Surgery Clear  Ensure       Oral Hygiene is also important to reduce your risk of infection.                                    Remember - BRUSH YOUR TEETH THE MORNING OF SURGERY WITH YOUR REGULAR TOOTHPASTE  DENTURES WILL BE REMOVED PRIOR TO SURGERY PLEASE DO NOT APPLY "Poly grip" OR ADHESIVES!!!   Do NOT smoke after Midnight   Take these medicines the morning of surgery with A  SIP OF WATER: Allopurinol, Atorvastatin, Protonix             You may not have any metal on your body including hair pins, jewelry, and body piercing             Do not wear make-up, lotions, powders, cologne, or deodorant              Men may shave face and neck.   Do not bring valuables to the hospital. [facility] IS NOT             RESPONSIBLE   FOR VALUABLES.   Contacts, glasses, dentures or bridgework may not be worn into surgery.   Bring small overnight bag day of surgery.   DO NOT BRING YOUR HOME MEDICATIONS TO THE HOSPITAL. PHARMACY WILL DISPENSE MEDICATIONS LISTED ON YOUR MEDICATION LIST TO YOU DURING YOUR ADMISSION IN THE HOSPITAL!    Patients discharged on the day of surgery will not be allowed to drive home.  Someone NEEDS to stay with you for the first 24 hours after anesthesia.   Special Instructions: Bring a copy of your healthcare power of attorney and living will documents the day of surgery if you haven't scanned them before.              Please read over the following fact sheets you were given: IF YOU HAVE QUESTIONS ABOUT YOUR PRE-OP INSTRUCTIONS PLEASE CALL [phone]   If you received a COVID test during your pre-op visit  it is requested that you wear a mask when out in public, stay away from anyone that may not be feeling well and notify your surgeon if you develop symptoms. If you test positive for Covid or have been in contact with anyone that has tested positive in the last 10 days please notify you surgeon.    [facility] - Preparing for Surgery Before surgery, you can play an important  role.  Because skin is not sterile, your skin needs to be as free of germs as possible.  You can reduce the number of germs on your skin by washing with CHG (chlorahexidine gluconate) soap before surgery.  CHG is an antiseptic cleaner which kills germs and bonds with the skin to continue killing germs even after washing. Please DO NOT use if you have an allergy to CHG or antibacterial soaps.  If your skin becomes reddened/irritated stop using the CHG and inform your nurse when you arrive at Short Stay. Do not shave (including legs and underarms) for at least 48 hours prior to the first CHG shower.  You may shave your face/neck.  Please follow these instructions carefully:  1.  Shower with CHG Soap the night before surgery and the  morning of surgery.  2.  If you choose to wash your hair, wash your hair first as usual with your normal  shampoo.  3.  After you shampoo, rinse your hair and body thoroughly to remove the shampoo.                             4.  Use CHG as you would any other liquid soap.  You can apply chg directly to the skin and wash.  Gently with a scrungie or clean washcloth.  5.  Apply the CHG Soap to your body ONLY FROM THE NECK DOWN.   Do   not use on face/ open  Wound or open sores. Avoid contact with eyes, ears mouth and   genitals (private parts).                       Wash face,  Genitals (private parts) with your normal soap.             6.  Wash thoroughly, paying special attention to the area where your    surgery  will be performed.  7.  Thoroughly rinse your body with warm water from the neck down.  8.  DO NOT shower/wash with your normal soap after using and rinsing off the CHG Soap.                9.  Pat yourself dry with a clean towel.            10.  Wear clean pajamas.            11.  Place clean sheets on your bed the night of your first shower and do not  sleep with pets. Day of Surgery : Do not apply any lotions/deodorants the morning  of surgery.  Please wear clean clothes to the hospital/surgery center.  FAILURE TO FOLLOW THESE INSTRUCTIONS MAY RESULT IN THE CANCELLATION OF YOUR SURGERY  PATIENT SIGNATURE_________________________________  NURSE SIGNATURE__________________________________  ________________________________________________________________________I Rogelia Mire (Watch this video at home: [email or web address])  An incentive spirometer is a tool that can help keep your lungs clear and active. This tool measures how well you are filling your lungs with each breath. Taking long deep breaths may help reverse or decrease the chance of developing breathing (pulmonary) problems (especially infection) following: A long period of time when you are unable to move or be active. BEFORE THE PROCEDURE  If the spirometer includes an indicator to show your best effort, your nurse or respiratory therapist will set it to a desired goal. If possible, sit up straight or lean slightly forward. Try not to slouch. Hold the incentive spirometer in an upright position. INSTRUCTIONS FOR USE  Sit on the edge of your bed if possible, or sit up as far as you can in bed or on a chair. Hold the incentive spirometer in an upright position. Breathe out normally. Place the mouthpiece in your mouth and seal your lips tightly around it. Breathe in slowly and as deeply as possible, raising the piston or the ball toward the top of the column. Hold your breath for 3-5 seconds or for as long as possible. Allow the piston or ball to fall to the bottom of the column. Remove the mouthpiece from your mouth and breathe out normally. Rest for a few seconds and repeat Steps 1 through 7 at least 10 times every 1-2 hours when you are awake. Take your time and take a few normal breaths between deep breaths. The spirometer may include an indicator to show your best effort. Use the indicator as a goal to work toward during each  repetition. After each set of 10 deep breaths, practice coughing to be sure your lungs are clear. If you have an incision (the cut made at the time of surgery), support your incision when coughing by placing a pillow or rolled up towels firmly against it. Once you are able to get out of bed, walk around indoors and cough well. You may stop using the incentive spirometer when instructed by your caregiver.  RISKS AND COMPLICATIONS Take your time so you do not get dizzy or light-headed. If you  are in pain, you may need to take or ask for pain medication before doing incentive spirometry. It is harder to take a deep breath if you are having pain. AFTER USE Rest and breathe slowly and easily. It can be helpful to keep track of a log of your progress. Your caregiver can provide you with a simple table to help with this. If you are using the spirometer at home, follow these instructions: SEEK MEDICAL CARE IF:  You are having difficultly using the spirometer. You have trouble using the spirometer as often as instructed. Your pain medication is not giving enough relief while using the spirometer. You develop fever of 100.5 F (38.1 C) or higher. SEEK IMMEDIATE MEDICAL CARE IF:  You cough up bloody sputum that had not been present before. You develop fever of 102 F (38.9 C) or greater. You develop worsening pain at or near the incision site. MAKE SURE YOU:  Understand these instructions. Will watch your condition. Will get help right away if you are not doing well or get worse. Document Released: 12/01/2006 Document Revised: 10/13/2011 Document Reviewed: 02/01/2007 [facility] Patient Information 2014 [place], [place].

## 2023-02-03 NOTE — Progress Notes (Signed)
COVID Vaccine received:  []  No [x]  Yes Date of any COVID positive Test in last 90 days:  PCP - Dr. Lilyan Punt Cardiologist - Dr. Dina Rich  Chest x-ray -  EKG -   Stress Test - 05/01/20  EPIC ECHO -  Cardiac Cath -   Bowel Prep - []  No  []   Yes ______  Pacemaker / ICD device []  No []  Yes   Spinal Cord Stimulator:[]  No []  Yes       History of Sleep Apnea? []  No []  Yes   CPAP used?- []  No []  Yes    Does the patient monitor blood sugar?          []  No []  Yes  []  N/A  Patient has: []  NO Hx DM   []  Pre-DM                 []  DM1  []   DM2 Does patient have a [employer] or [employer]om? []  No []  Yes   Fasting Blood Sugar Ranges-  Checks Blood Sugar _____ times a day  GLP1 agonist / usual dose -  GLP1 instructions:  SGLT-2 inhibitors / usual dose -  SGLT-2 instructions:   Blood Thinner / Instructions: Aspirin Instructions:  Comments:   Activity level: Patient is able / unable to climb a flight of stairs without difficulty; []  No CP  []  No SOB, but would have ___   Patient can / can not perform ADLs without assistance.   Anesthesia review:   Patient denies shortness of breath, fever, cough and chest pain at PAT appointment.  Patient verbalized understanding and agreement to the Pre-Surgical Instructions that were given to them at this PAT appointment. Patient was also educated of the need to review these PAT instructions again prior to his/her surgery.I reviewed the appropriate phone numbers to call if they have any and questions or concerns.

## 2023-02-04 ENCOUNTER — Encounter (HOSPITAL_COMMUNITY)
Admission: RE | Admit: 2023-02-04 | Discharge: 2023-02-04 | Disposition: A | Discharge status: Home / Self Care | Payer: 59 | Source: Ambulatory Visit | Attending: Anesthesiology | Admitting: Anesthesiology

## 2023-02-04 DIAGNOSIS — Z01818 Encounter for other preprocedural examination: Secondary | ICD-10-CM

## 2023-02-04 NOTE — Progress Notes (Signed)
COVID Vaccine received:  []  No [x]  Yes Date of any COVID positive Test in last 90 days:  PCP - Dr. Lilyan Punt Cardiologist - Dr. Dina Rich  Chest x-ray -  EKG -   Stress Test - 05/01/20  EPIC ECHO -  Cardiac Cath -   Bowel Prep - [x]  No  []   Yes ______  Pacemaker / ICD device [x]  No []  Yes   Spinal Cord Stimulator:[x]  No []  Yes       History of Sleep Apnea? [x]  No []  Yes   CPAP used?- []  No []  Yes    Does the patient monitor blood sugar?          [x]  No []  Yes  []  N/A  Patient has: [x]  NO Hx DM   []  Pre-DM                 []  DM1  []   DM2 Does patient have a [employer] or [employer]? []  No []  Yes   Fasting Blood Sugar Ranges-  Checks Blood Sugar __0___ times a day  GLP1 agonist / usual dose - no GLP1 instructions:  SGLT-2 inhibitors / usual dose - no SGLT-2 instructions:   Blood Thinner / Instructions: Aspirin Instructions:  Comments:   Activity level: Patient is able  to climb a flight of stairs without difficulty; []  No CP  []  No SOB,   Patient can perform ADLs without assistance.   Anesthesia review:   Patient denies shortness of breath, fever, cough and chest pain at PAT appointment.  Patient verbalized understanding and agreement to the Pre-Surgical Instructions that were given to them at this PAT appointment. Patient was also educated of the need to review these PAT instructions again prior to his/her surgery.I reviewed the appropriate phone numbers to call if they have any and questions or concerns.

## 2023-02-06 ENCOUNTER — Other Ambulatory Visit (HOSPITAL_COMMUNITY): Payer: Self-pay

## 2023-02-11 NOTE — Patient Instructions (Signed)
SURGICAL WAITING ROOM VISITATION  Patients having surgery or a procedure may have no more than 2 support people in the waiting area - these visitors may rotate.    Children under the age of 26 must have an adult with them who is not the patient.  Due to an increase in RSV and influenza rates and associated hospitalizations, children ages 38 and under may not visit patients in [facility] hospitals.  If the patient needs to stay at the hospital during part of their recovery, the visitor guidelines for inpatient rooms apply. Pre-op nurse will coordinate an appropriate time for 1 support person to accompany patient in pre-op.  This support person may not rotate.    Please refer to the [facility] website for the visitor guidelines for Inpatients (after your surgery is over and you are in a regular room).    Your procedure is scheduled on: 02/17/23   Report to [facility] Main Entrance    Report to admitting at 5:15 AM   Call this number if you have problems the morning of surgery [phone]   Do not eat food :After Midnight.   After Midnight you may have the following liquids until 4:30 AM DAY OF SURGERY  Water Non-Citrus Juices (without pulp, NO RED-Apple, White grape, White cranberry) Black Coffee (NO MILK/CREAM OR CREAMERS, sugar ok)  Clear Tea (NO MILK/CREAM OR CREAMERS, sugar ok) regular and decaf                             Plain Jell-O (NO RED)                                           Fruit ices (not with fruit pulp, NO RED)                                     Popsicles (NO RED)                                                               Sports drinks like Gatorade (NO RED)               The day of surgery:  Drink ONE (1) Pre-Surgery Clear Ensure at 4:30 AM the morning of surgery. Drink in one sitting. Do not sip.  This drink was given to you during your hospital  pre-op appointment visit. Nothing else to drink after completing the  Pre-Surgery Clear Ensure.           If you have questions, please contact your surgeon's office.   FOLLOW BOWEL PREP AND ANY ADDITIONAL PRE OP INSTRUCTIONS YOU RECEIVED FROM YOUR SURGEON'S OFFICE!!!     Oral Hygiene is also important to reduce your risk of infection.                                    Remember - BRUSH YOUR TEETH THE MORNING OF SURGERY WITH YOUR REGULAR TOOTHPASTE  DENTURES WILL BE REMOVED  PRIOR TO SURGERY PLEASE DO NOT APPLY "Poly grip" OR ADHESIVES!!!   Do NOT smoke after Midnight   Take these medicines the morning of surgery with A SIP OF WATER: Allopurinol, Atorvastatin, Pantoprazole  DO NOT TAKE ANY ORAL DIABETIC MEDICATIONS DAY OF YOUR SURGERY  Bring CPAP mask and tubing day of surgery.                              You may not have any metal on your body including jewelry, and body piercing             Do not wear lotions, powders, cologne, or deodorant              Men may shave face and neck.   Do not bring valuables to the hospital. [name] IS NOT             RESPONSIBLE   FOR VALUABLES.   Contacts, glasses, dentures or bridgework may not be worn into surgery.   Bring small overnight bag day of surgery.   DO NOT BRING YOUR HOME MEDICATIONS TO THE HOSPITAL. PHARMACY WILL DISPENSE MEDICATIONS LISTED ON YOUR MEDICATION LIST TO YOU DURING YOUR ADMISSION IN THE HOSPITAL!   Special Instructions: Bring a copy of your healthcare power of attorney and living will documents the day of surgery if you haven't scanned them before.              Please read over the following fact sheets you were given: IF YOU HAVE QUESTIONS ABOUT YOUR PRE-OP INSTRUCTIONS PLEASE CALL [name]   If you received a COVID test during your pre-op visit  it is requested that you wear a mask when out in public, stay away from anyone that may not be feeling well and notify your surgeon if you develop symptoms. If you test positive for Covid or have been in contact with anyone that has tested positive  in the last 10 days please notify you surgeon.    [place] - Preparing for Surgery Before surgery, you can play an important role.  Because skin is not sterile, your skin needs to be as free of germs as possible.  You can reduce the number of germs on your skin by washing with CHG (chlorahexidine gluconate) soap before surgery.  CHG is an antiseptic cleaner which kills germs and bonds with the skin to continue killing germs even after washing. Please DO NOT use if you have an allergy to CHG or antibacterial soaps.  If your skin becomes reddened/irritated stop using the CHG and inform your nurse when you arrive at Short Stay. Do not shave (including legs and underarms) for at least 48 hours prior to the first CHG shower.  You may shave your face/neck.  Please follow these instructions carefully:  1.  Shower with CHG Soap the night before surgery and the  morning of surgery.  2.  If you choose to wash your hair, wash your hair first as usual with your normal  shampoo.  3.  After you shampoo, rinse your hair and body thoroughly to remove the shampoo.                             4.  Use CHG as you would any other liquid soap.  You can apply chg directly to the skin and wash.  Gently with a scrungie or clean  washcloth.  5.  Apply the CHG Soap to your body ONLY FROM THE NECK DOWN.   Do   not use on face/ open                           Wound or open sores. Avoid contact with eyes, ears mouth and   genitals (private parts).                       Wash face,  Genitals (private parts) with your normal soap.             6.  Wash thoroughly, paying special attention to the area where your    surgery  will be performed.  7.  Thoroughly rinse your body with warm water from the neck down.  8.  DO NOT shower/wash with your normal soap after using and rinsing off the CHG Soap.                9.  Pat yourself dry with a clean towel.            10.  Wear clean pajamas.            11.  Place clean sheets on your  bed the night of your first shower and do not  sleep with pets. Day of Surgery : Do not apply any lotions/deodorants the morning of surgery.  Please wear clean clothes to the hospital/surgery center.  FAILURE TO FOLLOW THESE INSTRUCTIONS MAY RESULT IN THE CANCELLATION OF YOUR SURGERY  PATIENT SIGNATURE_________________________________  NURSE SIGNATURE__________________________________  ________________________________________________________________________  Marcus Hayes  An incentive spirometer is a tool that can help keep your lungs clear and active. This tool measures how well you are filling your lungs with each breath. Taking long deep breaths may help reverse or decrease the chance of developing breathing (pulmonary) problems (especially infection) following: A long period of time when you are unable to move or be active. BEFORE THE PROCEDURE  If the spirometer includes an indicator to show your best effort, your nurse or respiratory therapist will set it to a desired goal. If possible, sit up straight or lean slightly forward. Try not to slouch. Hold the incentive spirometer in an upright position. INSTRUCTIONS FOR USE  Sit on the edge of your bed if possible, or sit up as far as you can in bed or on a chair. Hold the incentive spirometer in an upright position. Breathe out normally. Place the mouthpiece in your mouth and seal your lips tightly around it. Breathe in slowly and as deeply as possible, raising the piston or the ball toward the top of the column. Hold your breath for 3-5 seconds or for as long as possible. Allow the piston or ball to fall to the bottom of the column. Remove the mouthpiece from your mouth and breathe out normally. Rest for a few seconds and repeat Steps 1 through 7 at least 10 times every 1-2 hours when you are awake. Take your time and take a few normal breaths between deep breaths. The spirometer may include an indicator to show your best  effort. Use the indicator as a goal to work toward during each repetition. After each set of 10 deep breaths, practice coughing to be sure your lungs are clear. If you have an incision (the cut made at the time of surgery), support your incision when coughing by placing a pillow or rolled up towels  firmly against it. Once you are able to get out of bed, walk around indoors and cough well. You may stop using the incentive spirometer when instructed by your caregiver.  RISKS AND COMPLICATIONS Take your time so you do not get dizzy or light-headed. If you are in pain, you may need to take or ask for pain medication before doing incentive spirometry. It is harder to take a deep breath if you are having pain. AFTER USE Rest and breathe slowly and easily. It can be helpful to keep track of a log of your progress. Your caregiver can provide you with a simple table to help with this. If you are using the spirometer at home, follow these instructions: SEEK MEDICAL CARE IF:  You are having difficultly using the spirometer. You have trouble using the spirometer as often as instructed. Your pain medication is not giving enough relief while using the spirometer. You develop fever of 100.5 F (38.1 C) or higher. SEEK IMMEDIATE MEDICAL CARE IF:  You cough up bloody sputum that had not been present before. You develop fever of 102 F (38.9 C) or greater. You develop worsening pain at or near the incision site. MAKE SURE YOU:  Understand these instructions. Will watch your condition. Will get help right away if you are not doing well or get worse. Document Released: 12/01/2006 Document Revised: 10/13/2011 Document Reviewed: 02/01/2007 [facility] Patient Information 2014 [place], [place].   ________________________________________________________________________

## 2023-02-11 NOTE — Progress Notes (Addendum)
COVID Vaccine Completed: yes  Date of COVID positive in last 90 days: no  PCP - Lilyan Punt, MD Cardiologist - Dina Rich, MD LOV 04/23/20 for CP  Chest x-ray - n/a EKG - 02/12/23 Epic/chart Stress Test - 05/01/20 Epic ECHO - n/a Cardiac Cath - n/a Pacemaker/ICD device last checked: n/a Spinal Cord Stimulator: n/a  Bowel Prep - no  Sleep Study - no CPAP -   Fasting Blood Sugar - n/a Checks Blood Sugar _____ times a day  Last dose of GLP1 agonist-  N/A GLP1 instructions:  N/A   Last dose of SGLT-2 inhibitors-  N/A SGLT-2 instructions: N/A   Blood Thinner Instructions:  n/a Aspirin Instructions: Last Dose:  Activity level: Can go up a flight of stairs and perform activities of daily living without stopping and without symptoms of chest pain or shortness of breath. Slow with stairs due to knee   Anesthesia review: RBBB  Patient denies shortness of breath, fever, cough and chest pain at PAT appointment  Patient verbalized understanding of instructions that were given to them at the PAT appointment. Patient was also instructed that they will need to review over the PAT instructions again at home before surgery.

## 2023-02-12 ENCOUNTER — Other Ambulatory Visit: Payer: Self-pay

## 2023-02-12 ENCOUNTER — Encounter (HOSPITAL_COMMUNITY)
Admission: RE | Admit: 2023-02-12 | Discharge: 2023-02-12 | Disposition: A | Discharge status: Home / Self Care | Payer: 59 | Source: Ambulatory Visit | Attending: Orthopedic Surgery | Admitting: Orthopedic Surgery

## 2023-02-12 ENCOUNTER — Encounter (HOSPITAL_COMMUNITY): Payer: Self-pay

## 2023-02-12 DIAGNOSIS — Z01818 Encounter for other preprocedural examination: Secondary | ICD-10-CM | POA: Diagnosis not present

## 2023-02-12 LAB — BASIC METABOLIC PANEL
Anion gap: 8 (ref 5–15)
BUN: 14 mg/dL (ref 6–20)
CO2: 25 mmol/L (ref 22–32)
Calcium: 9.1 mg/dL (ref 8.9–10.3)
Chloride: 108 mmol/L (ref 98–111)
Creatinine, Ser: 1.03 mg/dL (ref 0.61–1.24)
GFR, Estimated: >60 mL/min (ref >60)
Glucose, Bld: 113 mg/dL — ABNORMAL HIGH (ref 70–99)
Potassium: 4.9 mmol/L (ref 3.5–5.1)
Sodium: 141 mmol/L (ref 135–145)

## 2023-02-12 LAB — CBC
HCT: 47 % (ref 39.0–52.0)
Hemoglobin: 15.6 g/dL (ref 13.0–17.0)
MCH: 31.5 pg (ref 26.0–34.0)
MCHC: 33.2 g/dL (ref 30.0–36.0)
MCV: 94.9 fL (ref 80.0–100.0)
Platelets: 233 10*3/uL (ref 150–400)
RBC: 4.95 MIL/uL (ref 4.22–5.81)
RDW: 14.6 % (ref 11.5–15.5)
WBC: 10.1 10*3/uL (ref 4.0–10.5)
nRBC: 0 % (ref 0.0–0.2)

## 2023-02-12 LAB — SURGICAL PCR SCREEN
MRSA, PCR: NEGATIVE
Staphylococcus aureus: NEGATIVE

## 2023-02-16 NOTE — Discharge Instructions (Signed)
[facility] [address], [address] [phone] (tel)   [phone] (fax)   POST-OPERATIVE INSTRUCTIONS   WOUND CARE - You may remove the Operative Dressing on Post-Op Day #3 (72hrs after surgery).   - Alternatively if you would like you can leave dressing on until follow-up if within 7-8 days but keep it dry. - Leave steri-strips in place until they fall off on their own, usually 2 weeks postop. - An ACE wrap may be used to control swelling, do not wrap this too tight.  If the initial ACE wrap feels too tight you may loosen it. - There may be a small amount of fluid/bleeding leaking at the surgical site.  - This is normal; the knee is filled with fluid during the procedure and can leak for 24-48hrs after surgery.  - You may change/reinforce the bandage as needed.  - Use the Cryocuff or Ice as often as possible for the first 7 days, then as needed for pain relief. Always keep a towel, ACE wrap or other barrier between the cooling unit and your skin.  - You may shower on Post-Op Day #3. Gently pat the area dry.  - Do not soak the knee in water or submerge it.  - Do not go swimming in the pool or ocean until 4 weeks after surgery or when otherwise instructed.  Keep incisions as dry as possible.   BRACE/AMBULATION - You will be placed in a brace post-operatively.  - Wear your brace at all times until follow-up.  - You may remove for hygiene. -           Use crutches to help you ambulate -           Touch-down weight bearing: when you stand or walk, you may only touch your toes to the floor for balance -           Do NOT put any body weight on your leg  REGIONAL ANESTHESIA (NERVE BLOCKS) The anesthesia team may have performed a nerve block for you this is a great tool used to minimize pain.   The block may start wearing off overnight (between 8-24 hours postop) When the block wears off, your pain may go from nearly zero to the pain you would have  had postop without the block. This is an abrupt transition but nothing dangerous is happening.   This can be a challenging period but utilize your as needed pain medications to try and manage this period. We suggest you use the pain medication the first night prior to going to bed, to ease this transition.  You may take an extra dose of narcotic when this happens if needed  POST-OP MEDICATIONS- Multimodal approach to pain control In general your pain will be controlled with a combination of substances.  Prescriptions unless otherwise discussed are electronically sent to your pharmacy.  This is a carefully made plan we use to minimize narcotic use.     Celebrex - Anti-inflammatory medication taken on a scheduled basis Acetaminophen - Non-narcotic pain medicine taken on a scheduled basis  Oxycodone - This is a strong narcotic, to be used only on an "as needed" basis for SEVERE pain. Aspirin 81mg  - This medicine is used to minimize the risk of blood clots after surgery. Zofran - take as needed for nausea    FOLLOW-UP   Please call the office to schedule a follow-up appointment for your incision check, 7-10 days post-operatively.  IF YOU  HAVE ANY QUESTIONS, PLEASE FEEL FREE TO CALL OUR OFFICE.   HELPFUL INFORMATION    Keep your leg elevated to decrease swelling, which will then in turn decrease your pain. I would elevate the foot of your bed by putting a couple of couch pillows between your mattress and box spring. I would not keep pillow directly under your ankle.  - Do not sleep with a pillow behind your knee even if it is more comfortable as this may make it harder to get your knee fully straight long term.   There will be MORE swelling on days 1-3 than there is on the day of surgery.  This also is normal. The swelling will decrease with the anti-inflammatory medication, ice and keeping it elevated. The swelling will make it more difficult to bend your knee. As the swelling goes down  your motion will become easier   You may develop swelling and bruising that extends from your knee down to your calf and perhaps even to your foot over the next week. Do not be alarmed. This too is normal, and it is due to gravity   There may be some numbness adjacent to the incision site. This may last for 6-12 months or longer in some patients and is expected.   You may return to sedentary work/school in the next couple of days when you feel up to it. You will need to keep your leg elevated as much as possible    You should wean off your narcotic medicines as soon as you are able.  Most patients will be off or using minimal narcotics before their first postop appointment.    We suggest you use the pain medication the first night prior to going to bed, in order to ease any pain when the anesthesia wears off. You should avoid taking pain medications on an empty stomach as it will make you nauseous.   Do not drink alcoholic beverages or take illicit drugs when taking pain medications.   It is against the law to drive while taking narcotics. You cannot drive if your Right leg is in brace locked in extension.   Pain medication may make you constipated.  Below are a few solutions to try in this order:  o Decrease the amount of pain medication if you aren't having pain.  o Drink lots of decaffeinated fluids.  o Drink prune juice and/or each dried prunes   o If the first 3 don't work start with additional solutions  o Take Colace - an over-the-counter stool softener  o Take Senokot - an over-the-counter laxative  o Take Miralax - a stronger over-the-counter laxative

## 2023-02-16 NOTE — Anesthesia Preprocedure Evaluation (Signed)
Anesthesia Evaluation  Patient identified by MRN, date of birth, ID band Patient awake    Reviewed: Allergy & Precautions, NPO status , Patient's Chart, lab work & pertinent test results  History of Anesthesia Complications Negative for: history of anesthetic complications  Airway Mallampati: III  TM Distance: >3 FB Neck ROM: Full    Dental  (+) Teeth Intact, Dental Advisory Given   Pulmonary Current Smoker and Patient abstained from smoking. Varies 1/2ppd-1ppd No inhalers    Pulmonary exam normal breath sounds clear to auscultation       Cardiovascular negative cardio ROS Normal cardiovascular exam Rhythm:Regular Rate:Normal     Neuro/Psych  negative psych ROS   GI/Hepatic Neg liver ROS,GERD  Controlled and Medicated,,  Endo/Other  negative endocrine ROS    Renal/GU negative Renal ROS  negative genitourinary   Musculoskeletal  (+) Arthritis , Osteoarthritis,  Chronic pain- has been taking hydrocodone 10mg  5x/d   Abdominal   Peds  Hematology negative hematology ROS (+) Hb 15.6, plt 233   Anesthesia Other Findings   Reproductive/Obstetrics negative OB ROS                             Anesthesia Physical Anesthesia Plan  ASA: 3  Anesthesia Plan: Spinal, Regional and MAC   Post-op Pain Management: Regional block* and Tylenol PO (pre-op)*   Induction:   PONV Risk Score and Plan: 2 and Propofol infusion and TIVA  Airway Management Planned: Natural Airway and Nasal Cannula  Additional Equipment: None  Intra-op Plan:   Post-operative Plan:   Informed Consent: I have reviewed the patients History and Physical, chart, labs and discussed the procedure including the risks, benefits and alternatives for the proposed anesthesia with the patient or authorized representative who has indicated his/her understanding and acceptance.       Plan Discussed with: CRNA  Anesthesia Plan  Comments:         Anesthesia Quick Evaluation

## 2023-02-17 ENCOUNTER — Ambulatory Visit (HOSPITAL_COMMUNITY): Payer: 59 | Admitting: Physician Assistant

## 2023-02-17 ENCOUNTER — Ambulatory Visit (HOSPITAL_COMMUNITY): Payer: 59

## 2023-02-17 ENCOUNTER — Inpatient Hospital Stay (HOSPITAL_COMMUNITY)
Admission: AD | Admit: 2023-02-17 | Discharge: 2023-02-23 | DRG: 489 | Disposition: A | Discharge status: Home Health | Payer: 59 | Attending: Orthopedic Surgery | Admitting: Orthopedic Surgery

## 2023-02-17 ENCOUNTER — Other Ambulatory Visit: Payer: Self-pay

## 2023-02-17 ENCOUNTER — Encounter (HOSPITAL_COMMUNITY): Payer: Self-pay | Admitting: Orthopedic Surgery

## 2023-02-17 ENCOUNTER — Encounter (HOSPITAL_COMMUNITY)
Admission: AD | Disposition: A | Discharge status: Home Health | Payer: Self-pay | Source: Home / Self Care | Attending: Orthopedic Surgery

## 2023-02-17 ENCOUNTER — Ambulatory Visit (HOSPITAL_BASED_OUTPATIENT_CLINIC_OR_DEPARTMENT_OTHER): Payer: 59 | Admitting: Anesthesiology

## 2023-02-17 DIAGNOSIS — M2342 Loose body in knee, left knee: Secondary | ICD-10-CM | POA: Diagnosis present

## 2023-02-17 DIAGNOSIS — G8918 Other acute postprocedural pain: Secondary | ICD-10-CM | POA: Diagnosis not present

## 2023-02-17 DIAGNOSIS — M84352A Stress fracture, left femur, initial encounter for fracture: Principal | ICD-10-CM | POA: Diagnosis present

## 2023-02-17 DIAGNOSIS — S72492A Other fracture of lower end of left femur, initial encounter for closed fracture: Secondary | ICD-10-CM | POA: Diagnosis not present

## 2023-02-17 DIAGNOSIS — M1712 Unilateral primary osteoarthritis, left knee: Secondary | ICD-10-CM | POA: Diagnosis not present

## 2023-02-17 DIAGNOSIS — G8929 Other chronic pain: Secondary | ICD-10-CM | POA: Diagnosis present

## 2023-02-17 DIAGNOSIS — M84452A Pathological fracture, left femur, initial encounter for fracture: Principal | ICD-10-CM | POA: Diagnosis present

## 2023-02-17 DIAGNOSIS — Z79899 Other long term (current) drug therapy: Secondary | ICD-10-CM

## 2023-02-17 DIAGNOSIS — K219 Gastro-esophageal reflux disease without esophagitis: Secondary | ICD-10-CM | POA: Diagnosis present

## 2023-02-17 DIAGNOSIS — Z608 Other problems related to social environment: Secondary | ICD-10-CM | POA: Diagnosis present

## 2023-02-17 DIAGNOSIS — Z87442 Personal history of urinary calculi: Secondary | ICD-10-CM

## 2023-02-17 DIAGNOSIS — Z981 Arthrodesis status: Secondary | ICD-10-CM

## 2023-02-17 DIAGNOSIS — M1990 Unspecified osteoarthritis, unspecified site: Secondary | ICD-10-CM | POA: Diagnosis present

## 2023-02-17 DIAGNOSIS — E785 Hyperlipidemia, unspecified: Secondary | ICD-10-CM

## 2023-02-17 DIAGNOSIS — M25552 Pain in left hip: Secondary | ICD-10-CM | POA: Diagnosis not present

## 2023-02-17 DIAGNOSIS — F17210 Nicotine dependence, cigarettes, uncomplicated: Secondary | ICD-10-CM | POA: Diagnosis present

## 2023-02-17 HISTORY — PX: HIP PINNING,CANNULATED: SHX1758

## 2023-02-17 LAB — CREATININE, SERUM
Creatinine, Ser: 0.97 mg/dL (ref 0.61–1.24)
GFR, Estimated: >60 mL/min (ref >60)

## 2023-02-17 LAB — CBC
HCT: 46.1 % (ref 39.0–52.0)
Hemoglobin: 14.9 g/dL (ref 13.0–17.0)
MCH: 31.2 pg (ref 26.0–34.0)
MCHC: 32.3 g/dL (ref 30.0–36.0)
MCV: 96.4 fL (ref 80.0–100.0)
Platelets: 206 10*3/uL (ref 150–400)
RBC: 4.78 MIL/uL (ref 4.22–5.81)
RDW: 14.3 % (ref 11.5–15.5)
WBC: 8.1 10*3/uL (ref 4.0–10.5)
nRBC: 0 % (ref 0.0–0.2)

## 2023-02-17 SURGERY — FIXATION, FEMUR, NECK, PERCUTANEOUS, USING SCREW
Anesthesia: Monitor Anesthesia Care | Site: Hip | Laterality: Left

## 2023-02-17 MED ORDER — FENTANYL CITRATE (PF) 100 MCG/2ML IJ SOLN
INTRAMUSCULAR | Status: DC | PRN
  Administered 2023-02-17: 25 ug via INTRAVENOUS
  Administered 2023-02-17: 75 ug via INTRAVENOUS

## 2023-02-17 MED ORDER — ORAL CARE MOUTH RINSE: 15.0000 mL | Freq: Once | OROMUCOSAL | Status: AC

## 2023-02-17 MED ORDER — MIDAZOLAM HCL 2 MG/2ML IJ SOLN
INTRAMUSCULAR | Status: AC
  Filled 2023-02-17: qty 2

## 2023-02-17 MED ORDER — CEFAZOLIN SODIUM-DEXTROSE 2-4 GM/100ML-% IV SOLN
2.0000 g | INTRAVENOUS | Status: AC
  Administered 2023-02-17: 2 g via INTRAVENOUS
  Filled 2023-02-17: qty 100

## 2023-02-17 MED ORDER — METHOCARBAMOL 500 MG PO TABS
500.0000 mg | ORAL_TABLET | Freq: Four times a day (QID) | ORAL | Status: DC | PRN
  Administered 2023-02-18 – 2023-02-19 (×5): 500 mg via ORAL
  Filled 2023-02-17 (×5): qty 1

## 2023-02-17 MED ORDER — ACETAMINOPHEN 325 MG PO TABS: 325.0000 mg | ORAL_TABLET | Freq: Four times a day (QID) | ORAL | Status: DC | PRN

## 2023-02-17 MED ORDER — DOCUSATE SODIUM 100 MG PO CAPS
100.0000 mg | ORAL_CAPSULE | Freq: Two times a day (BID) | ORAL | Status: DC
  Administered 2023-02-17 – 2023-02-23 (×13): 100 mg via ORAL
  Filled 2023-02-17 (×13): qty 1

## 2023-02-17 MED ORDER — ACETAMINOPHEN 500 MG PO TABS
1000.0000 mg | ORAL_TABLET | Freq: Once | ORAL | Status: AC
  Administered 2023-02-17: 1000 mg via ORAL
  Filled 2023-02-17: qty 2

## 2023-02-17 MED ORDER — POVIDONE-IODINE 10 % EX SWAB: 2.0000 | Freq: Once | CUTANEOUS | Status: DC

## 2023-02-17 MED ORDER — HYDROMORPHONE HCL 1 MG/ML IJ SOLN
0.5000 mg | INTRAMUSCULAR | Status: DC | PRN
  Administered 2023-02-17 – 2023-02-19 (×7): 1 mg via INTRAVENOUS
  Filled 2023-02-17 (×7): qty 1

## 2023-02-17 MED ORDER — OXYCODONE HCL 5 MG/5ML PO SOLN: 5.0000 mg | Freq: Once | ORAL | Status: DC | PRN

## 2023-02-17 MED ORDER — KETOROLAC TROMETHAMINE 30 MG/ML IJ SOLN: 15.0000 mg | Freq: Once | INTRAMUSCULAR | Status: DC | PRN

## 2023-02-17 MED ORDER — PHENYLEPHRINE HCL-NACL 20-0.9 MG/250ML-% IV SOLN
INTRAVENOUS | Status: DC | PRN
  Administered 2023-02-17: 30 ug/min via INTRAVENOUS

## 2023-02-17 MED ORDER — CHLORHEXIDINE GLUCONATE 0.12 % MT SOLN
15.0000 mL | Freq: Once | OROMUCOSAL | Status: AC
  Administered 2023-02-17: 15 mL via OROMUCOSAL

## 2023-02-17 MED ORDER — OXYCODONE HCL 5 MG PO TABS
5.0000 mg | ORAL_TABLET | ORAL | Status: DC | PRN
  Administered 2023-02-17: 5 mg via ORAL
  Filled 2023-02-17: qty 2
  Filled 2023-02-17: qty 1
  Filled 2023-02-17: qty 2

## 2023-02-17 MED ORDER — ENOXAPARIN SODIUM 40 MG/0.4ML IJ SOSY
40.0000 mg | PREFILLED_SYRINGE | INTRAMUSCULAR | Status: DC
  Administered 2023-02-18 – 2023-02-23 (×6): 40 mg via SUBCUTANEOUS
  Filled 2023-02-17 (×6): qty 0.4

## 2023-02-17 MED ORDER — BUPIVACAINE IN DEXTROSE 0.75-8.25 % IT SOLN
INTRATHECAL | Status: DC | PRN
  Administered 2023-02-17: 1.8 mL via INTRATHECAL

## 2023-02-17 MED ORDER — ONDANSETRON HCL 4 MG PO TABS: 4.0000 mg | ORAL_TABLET | Freq: Four times a day (QID) | ORAL | Status: DC | PRN

## 2023-02-17 MED ORDER — AMISULPRIDE (ANTIEMETIC) 5 MG/2ML IV SOLN: 10.0000 mg | Freq: Once | INTRAVENOUS | Status: DC | PRN

## 2023-02-17 MED ORDER — ZOLPIDEM TARTRATE 5 MG PO TABS
5.0000 mg | ORAL_TABLET | Freq: Every evening | ORAL | Status: DC | PRN
  Administered 2023-02-19 – 2023-02-22 (×3): 5 mg via ORAL
  Filled 2023-02-17 (×3): qty 1

## 2023-02-17 MED ORDER — SODIUM CHLORIDE 0.9 % IR SOLN
Status: DC | PRN
  Administered 2023-02-17: 3000 mL

## 2023-02-17 MED ORDER — ROPIVACAINE HCL 5 MG/ML IJ SOLN
INTRAMUSCULAR | Status: DC | PRN
  Administered 2023-02-17: 30 mL via PERINEURAL

## 2023-02-17 MED ORDER — FENTANYL CITRATE (PF) 100 MCG/2ML IJ SOLN
INTRAMUSCULAR | Status: AC
  Filled 2023-02-17: qty 2

## 2023-02-17 MED ORDER — MIDAZOLAM HCL 5 MG/5ML IJ SOLN
INTRAMUSCULAR | Status: DC | PRN
  Administered 2023-02-17: 2 mg via INTRAVENOUS

## 2023-02-17 MED ORDER — BUPIVACAINE HCL (PF) 0.25 % IJ SOLN
INTRAMUSCULAR | Status: AC
  Filled 2023-02-17: qty 30

## 2023-02-17 MED ORDER — ORAL CARE MOUTH RINSE: 15.0000 mL | OROMUCOSAL | Status: DC | PRN

## 2023-02-17 MED ORDER — LACTATED RINGERS IV SOLN: INTRAVENOUS | Status: DC

## 2023-02-17 MED ORDER — LIDOCAINE HCL (PF) 2 % IJ SOLN
INTRAMUSCULAR | Status: AC
  Filled 2023-02-17: qty 5

## 2023-02-17 MED ORDER — OXYCODONE HCL 5 MG PO TABS: 5.0000 mg | ORAL_TABLET | Freq: Once | ORAL | Status: DC | PRN

## 2023-02-17 MED ORDER — DEXAMETHASONE SODIUM PHOSPHATE 10 MG/ML IJ SOLN
INTRAMUSCULAR | Status: AC
  Filled 2023-02-17: qty 1

## 2023-02-17 MED ORDER — DIPHENHYDRAMINE HCL 12.5 MG/5ML PO ELIX
12.5000 mg | ORAL_SOLUTION | ORAL | Status: DC | PRN
  Administered 2023-02-18 – 2023-02-21 (×5): 25 mg via ORAL
  Filled 2023-02-17 (×5): qty 10

## 2023-02-17 MED ORDER — ACETAMINOPHEN 500 MG PO TABS
1000.0000 mg | ORAL_TABLET | Freq: Four times a day (QID) | ORAL | Status: AC
  Administered 2023-02-17 – 2023-02-18 (×4): 1000 mg via ORAL
  Filled 2023-02-17 (×4): qty 2

## 2023-02-17 MED ORDER — DEXAMETHASONE SODIUM PHOSPHATE 10 MG/ML IJ SOLN
INTRAMUSCULAR | Status: DC | PRN
  Administered 2023-02-17: 10 mg

## 2023-02-17 MED ORDER — ONDANSETRON HCL 4 MG/2ML IJ SOLN: 4.0000 mg | Freq: Once | INTRAMUSCULAR | Status: DC | PRN

## 2023-02-17 MED ORDER — OXYCODONE HCL 5 MG PO TABS
10.0000 mg | ORAL_TABLET | ORAL | Status: DC | PRN
  Administered 2023-02-17 – 2023-02-23 (×26): 15 mg via ORAL
  Filled 2023-02-17 (×25): qty 3

## 2023-02-17 MED ORDER — PHENYLEPHRINE HCL-NACL 20-0.9 MG/250ML-% IV SOLN
INTRAVENOUS | Status: AC
  Filled 2023-02-17: qty 250

## 2023-02-17 MED ORDER — EPHEDRINE 5 MG/ML INJ
INTRAVENOUS | Status: AC
  Filled 2023-02-17: qty 5

## 2023-02-17 MED ORDER — METHOCARBAMOL 1000 MG/10ML IJ SOLN
500.0000 mg | Freq: Four times a day (QID) | INTRAVENOUS | Status: DC | PRN
  Administered 2023-02-17: 500 mg via INTRAVENOUS
  Filled 2023-02-17: qty 5
  Filled 2023-02-17: qty 500

## 2023-02-17 MED ORDER — CELECOXIB 200 MG PO CAPS
200.0000 mg | ORAL_CAPSULE | Freq: Two times a day (BID) | ORAL | Status: DC
  Administered 2023-02-17 – 2023-02-23 (×13): 200 mg via ORAL
  Filled 2023-02-17 (×13): qty 1

## 2023-02-17 MED ORDER — METOCLOPRAMIDE HCL 5 MG/ML IJ SOLN: 5.0000 mg | Freq: Three times a day (TID) | INTRAMUSCULAR | Status: DC | PRN

## 2023-02-17 MED ORDER — CEFAZOLIN SODIUM-DEXTROSE 1-4 GM/50ML-% IV SOLN
1.0000 g | Freq: Four times a day (QID) | INTRAVENOUS | Status: AC
  Administered 2023-02-17 – 2023-02-18 (×3): 1 g via INTRAVENOUS
  Filled 2023-02-17 (×3): qty 50

## 2023-02-17 MED ORDER — PROPOFOL 10 MG/ML IV BOLUS
INTRAVENOUS | Status: DC | PRN
  Administered 2023-02-17: 30 mg via INTRAVENOUS
  Administered 2023-02-17: 20 mg via INTRAVENOUS

## 2023-02-17 MED ORDER — ONDANSETRON HCL 4 MG/2ML IJ SOLN
INTRAMUSCULAR | Status: AC
  Filled 2023-02-17: qty 2

## 2023-02-17 MED ORDER — PANTOPRAZOLE SODIUM 40 MG PO TBEC
40.0000 mg | DELAYED_RELEASE_TABLET | Freq: Every day | ORAL | Status: DC
  Administered 2023-02-17 – 2023-02-23 (×7): 40 mg via ORAL
  Filled 2023-02-17 (×7): qty 1

## 2023-02-17 MED ORDER — ATORVASTATIN CALCIUM 20 MG PO TABS
20.0000 mg | ORAL_TABLET | Freq: Every evening | ORAL | Status: DC
  Administered 2023-02-17 – 2023-02-22 (×6): 20 mg via ORAL
  Filled 2023-02-17 (×6): qty 1

## 2023-02-17 MED ORDER — DEXMEDETOMIDINE HCL IN NACL 80 MCG/20ML IV SOLN
INTRAVENOUS | Status: DC | PRN
  Administered 2023-02-17: 12 ug via INTRAVENOUS
  Administered 2023-02-17: 4 ug via INTRAVENOUS

## 2023-02-17 MED ORDER — ONDANSETRON HCL 4 MG/2ML IJ SOLN
INTRAMUSCULAR | Status: DC | PRN
Start: 2023-02-17 — End: 2023-02-17
  Administered 2023-02-17: 4 mg via INTRAVENOUS

## 2023-02-17 MED ORDER — METOCLOPRAMIDE HCL 5 MG PO TABS: 5.0000 mg | ORAL_TABLET | Freq: Three times a day (TID) | ORAL | Status: DC | PRN

## 2023-02-17 MED ORDER — ALLOPURINOL 300 MG PO TABS
300.0000 mg | ORAL_TABLET | Freq: Every day | ORAL | Status: DC
  Administered 2023-02-18 – 2023-02-23 (×6): 300 mg via ORAL
  Filled 2023-02-17 (×6): qty 1

## 2023-02-17 MED ORDER — LIDOCAINE HCL (CARDIAC) PF 100 MG/5ML IV SOSY
PREFILLED_SYRINGE | INTRAVENOUS | Status: DC | PRN
  Administered 2023-02-17: 20 mg via INTRAVENOUS
  Administered 2023-02-17: 40 mg via INTRAVENOUS

## 2023-02-17 MED ORDER — DEXAMETHASONE SODIUM PHOSPHATE 10 MG/ML IJ SOLN
8.0000 mg | Freq: Once | INTRAMUSCULAR | Status: AC
  Administered 2023-02-17: 8 mg via INTRAVENOUS

## 2023-02-17 MED ORDER — PROPOFOL 500 MG/50ML IV EMUL
INTRAVENOUS | Status: DC | PRN
  Administered 2023-02-17: 100 ug/kg/min via INTRAVENOUS

## 2023-02-17 MED ORDER — ONDANSETRON HCL 4 MG/2ML IJ SOLN: 4.0000 mg | Freq: Four times a day (QID) | INTRAMUSCULAR | Status: DC | PRN

## 2023-02-17 MED ORDER — EPHEDRINE SULFATE (PRESSORS) 50 MG/ML IJ SOLN
INTRAMUSCULAR | Status: DC | PRN
  Administered 2023-02-17 (×2): 5 mg via INTRAVENOUS

## 2023-02-17 MED ORDER — HYDROMORPHONE HCL 1 MG/ML IJ SOLN: 0.2500 mg | INTRAMUSCULAR | Status: DC | PRN

## 2023-02-17 SURGICAL SUPPLY — 47 items
BAG COUNTER SPONGE SURGICOUNT (BAG) ×2 IMPLANT
BAG SPNG CNTER NS LX DISP (BAG) ×1
BNDG CMPR 5X4 CHSV STRCH STRL (GAUZE/BANDAGES/DRESSINGS) ×1
BNDG CMPR MED 10X6 ELC LF (GAUZE/BANDAGES/DRESSINGS) ×1
BNDG COHESIVE 4X5 TAN STRL LF (GAUZE/BANDAGES/DRESSINGS) ×2 IMPLANT
BNDG ELASTIC 6X10 VLCR STRL LF (GAUZE/BANDAGES/DRESSINGS) IMPLANT
BNDG GAUZE DERMACEA FLUFF 4 (GAUZE/BANDAGES/DRESSINGS) IMPLANT
BNDG GZE DERMACEA 4 6PLY (GAUZE/BANDAGES/DRESSINGS)
COVER PERINEAL POST (MISCELLANEOUS) ×2 IMPLANT
COVER SURGICAL LIGHT HANDLE (MISCELLANEOUS) ×2 IMPLANT
DISSECTOR 4.0MM X 13CM (MISCELLANEOUS) IMPLANT
DRAPE EXTREMITY T 121X128X90 (DISPOSABLE) IMPLANT
DRAPE IMP U-DRAPE 54X76 (DRAPES) IMPLANT
DRAPE STERI IOBAN 125X83 (DRAPES) ×2 IMPLANT
DRAPE TOP 10253 STERILE (DRAPES) IMPLANT
DRESSING MEPILEX FLEX 4X4 (GAUZE/BANDAGES/DRESSINGS) ×2 IMPLANT
DRSG MEPILEX FLEX 4X4 (GAUZE/BANDAGES/DRESSINGS) ×1
DURAPREP 26ML APPLICATOR (WOUND CARE) ×2 IMPLANT
ELECT REM PT RETURN 15FT ADLT (MISCELLANEOUS) ×2 IMPLANT
GAUZE PAD ABD 8X10 STRL (GAUZE/BANDAGES/DRESSINGS) IMPLANT
GAUZE SPONGE 4X4 12PLY STRL (GAUZE/BANDAGES/DRESSINGS) IMPLANT
GAUZE XEROFORM 1X8 LF (GAUZE/BANDAGES/DRESSINGS) IMPLANT
GLOVE BIO SURGEON STRL SZ7.5 (GLOVE) ×4 IMPLANT
GLOVE BIOGEL PI IND STRL 7.5 (GLOVE) ×2 IMPLANT
GLOVE BIOGEL PI IND STRL 8 (GLOVE) ×2 IMPLANT
GOWN STRL REUS W/ TWL LRG LVL3 (GOWN DISPOSABLE) ×2 IMPLANT
GOWN STRL REUS W/TWL LRG LVL3 (GOWN DISPOSABLE) ×1
GRAFT FILLER BONE 5ML (Knees) IMPLANT
IMMOBILIZER KNEE 20 (SOFTGOODS) ×1
IMMOBILIZER KNEE 20 THIGH 36 (SOFTGOODS) IMPLANT
KIT ACCUFILL 5CC (Knees) ×1 IMPLANT
KIT BASIN OR (CUSTOM PROCEDURE TRAY) ×2 IMPLANT
KIT KNEE SCP 414.502 (Knees) ×1 IMPLANT
KIT TURNOVER KIT A (KITS) IMPLANT
MANIFOLD NEPTUNE II (INSTRUMENTS) ×2 IMPLANT
NS IRRIG 1000ML POUR BTL (IV SOLUTION) ×2 IMPLANT
PACK GENERAL/GYN (CUSTOM PROCEDURE TRAY) ×2 IMPLANT
PAD ARMBOARD 7.5X6 YLW CONV (MISCELLANEOUS) ×4 IMPLANT
PADDING CAST COTTON 6X4 STRL (CAST SUPPLIES) IMPLANT
STOCKINETTE 6 STRL (DRAPES) IMPLANT
STRIP CLOSURE SKIN 1/2X4 (GAUZE/BANDAGES/DRESSINGS) ×2 IMPLANT
SUT VIC AB 2-0 CT1 27 (SUTURE) ×1
SUT VIC AB 2-0 CT1 TAPERPNT 27 (SUTURE) ×2 IMPLANT
SUT VIC AB 4-0 PS2 18 (SUTURE) ×2 IMPLANT
TOWEL OR 17X26 10 PK STRL BLUE (TOWEL DISPOSABLE) ×2 IMPLANT
TUBING CONNECTING 10 (TUBING) IMPLANT
WATER STERILE IRR 1000ML POUR (IV SOLUTION) ×4 IMPLANT

## 2023-02-17 NOTE — Interval H&P Note (Signed)
History and Physical Interval Note:  02/17/2023 7:08 AM  Marcus Hayes  has presented today for surgery, with the diagnosis of LEFT DISTAL FEMUR STRESS FRACTURE.  The various methods of treatment have been discussed with the patient and family. After consideration of risks, benefits and other options for treatment, the patient has consented to  Procedure(s): PERCUTANEOUS FIXATION OF THE DISTAL FEMUR WITH CHONDROPLASTY (Left) as a surgical intervention.  The patient's history has been reviewed, patient examined, no change in status, stable for surgery.  I have reviewed the patient's chart and labs.  Questions were answered to the patient's satisfaction.     Sheral Apley

## 2023-02-17 NOTE — Transfer of Care (Signed)
Immediate Anesthesia Transfer of Care Note  Patient: Marcus Hayes  Procedure(s) Performed: PERCUTANEOUS FIXATION OF THE DISTAL FEMUR, DIAGNOSTIC ARTHOSCOPY OF CHONDROPLASTY (Left: Hip)  Patient Location: PACU  Anesthesia Type:Spinal  Level of Consciousness: awake, alert , oriented, and patient cooperative  Airway & Oxygen Therapy: Patient Spontanous Breathing and Patient connected to face mask oxygen  Post-op Assessment: Report given to RN and Post -op Vital signs reviewed and stable  Post vital signs: Reviewed and stable  Last Vitals:  Vitals Value Taken Time  BP 115/70 02/17/23 0845  Temp    Pulse 75 02/17/23 0845  Resp 16 02/17/23 0845  SpO2 100 % 02/17/23 0845  Vitals shown include unfiled device data.  Last Pain:  Vitals:   02/17/23 0623  TempSrc: Oral  PainSc:          Complications: No notable events documented.

## 2023-02-17 NOTE — Anesthesia Procedure Notes (Signed)
Anesthesia Regional Block: Adductor canal block   Pre-Anesthetic Checklist: , timeout performed,  Correct Patient, Correct Site, Correct Laterality,  Correct Procedure, Correct Position, site marked,  Risks and benefits discussed,  Surgical consent,  Pre-op evaluation,  At surgeon's request and post-op pain management  Laterality: Left  Prep: Maximum Sterile Barrier Precautions used, chloraprep       Needles:  Injection technique: Single-shot  Needle Type: Echogenic Stimulator Needle     Needle Length: 9cm  Needle Gauge: 22     Additional Needles:   Procedures:,,,, ultrasound used (permanent image in chart),,    Narrative:  Start time: 02/17/2023 7:05 AM End time: 02/17/2023 7:10 AM Injection made incrementally with aspirations every 5 mL.  Performed by: Personally  Anesthesiologist: Lannie Fields, DO  Additional Notes: Monitors applied. No increased pain on injection. No increased resistance to injection. Injection made in 5cc increments. Good needle visualization. Patient tolerated procedure well.

## 2023-02-17 NOTE — Anesthesia Procedure Notes (Signed)
Spinal  Patient location during procedure: OR Start time: 02/17/2023 7:37 AM End time: 02/17/2023 7:41 AM Reason for block: surgical anesthesia Staffing Performed: anesthesiologist and resident/CRNA  Resident/CRNA: Garth Bigness, CRNA Performed by: Garth Bigness, CRNA Authorized by: Lannie Fields, DO   Preanesthetic Checklist Completed: patient identified, IV checked, site marked, risks and benefits discussed, surgical consent, monitors and equipment checked, pre-op evaluation and timeout performed Spinal Block Patient position: sitting Prep: ChloraPrep Patient monitoring: heart rate, continuous pulse ox and blood pressure Approach: midline Location: L3-4 Injection technique: single-shot Needle Needle type: Pencan  Needle gauge: 24 G Needle length: 9 cm Needle insertion depth: 5 cm Assessment Sensory level: T10 Events: CSF return Additional Notes

## 2023-02-17 NOTE — Op Note (Signed)
02/17/2023  8:39 AM  PATIENT:  Marcus Hayes    PRE-OPERATIVE DIAGNOSIS:  LEFT DISTAL FEMUR STRESS FRACTURE  POST-OPERATIVE DIAGNOSIS:  Same  PROCEDURE:  PERCUTANEOUS FIXATION OF THE DISTAL FEMUR, DIAGNOSTIC ARTHOSCOPY OF CHONDROPLASTY  SURGEON:  Sheral Apley, MD  ASSISTANT: Levester Fresh, PA-C, he was present and scrubbed throughout the case, critical for completion in a timely fashion, and for retraction, instrumentation, and closure.   ANESTHESIA:   General  BLOOD LOSS: min  COMPLICATIONS: None   PREOPERATIVE INDICATIONS:  Marcus Hayes is a  62 y.o. male with a diagnosis of LEFT DISTAL FEMUR STRESS FRACTURE who failed conservative measures and elected for surgical management.    The risks benefits and alternatives were discussed with the patient preoperatively including but not limited to the risks of infection, bleeding, nerve injury, cardiopulmonary complications, the need for revision surgery, among others, and the patient was willing to proceed.  OPERATIVE IMPLANTS: subchondroplasty  OPERATIVE FINDINGS: Examination under anesthesia: stable Diagnostic Arthroscopy:  articular cartilage:grade 3 MFC Medial meniscus:stable Lateral meniscus:stable Anterior cruciate ligament/PCL: stable Loose bodies: 5mm loose body    OPERATIVE PROCEDURE:  Patient was identified in the preoperative holding area and site was marked by me male was transported to the operating theater and placed on the table in supine position taking care to pad all bony prominences. After a preincinduction time out anesthesia was induced.  male received ancef for preoperative antibiotics. The left lower extremity was prepped and draped in normal sterile fashion and a pre-incision timeout was performed.   A small stab incision was made in the anterolateral portal position. The arthroscope was introduced in the joint. A medial portal was then established under direct visualization just above the  anterior horn of the medial meniscus. Diagnostic arthroscopy was then carried out with findings as described above.  H fluoroscopic guidance to localize the medial femoral condyle where the known bone edema was.  I made a stab incision medially and inserted the stylus for subchondroplasty under fluoroscopic guidance under multiple views  I then mixed the cement and injected this using a side loader in all cardinal directions.  I used fluoroscopy to confirm no extravasation of the cement.  I then left the stylus in place until cement was hardened  I made arthroscopic portals medially and laterally inserted the arthroscope I performed a tour of the knee diagnostic arthroscopy showed the above findings he did have a loose body I expanded the medial portal and remove this with a grabber  There was no cement extravasation into the knee.  The arthroscopic equipment was removed from the joint and the portals were closed with 3-0 nylon in an interrupted fashion.  Sterile dressings were then applied including Xeroform 4 x 4's ABDs an ACE bandage.  The patient was then allowed to awaken from general anesthesia, transferred to the stretcher and taken to the recovery room in stable condition.  POSTOPERATIVE PLAN: The patient will be discharged home today and will followup in one week for suture removal and wound check.

## 2023-02-17 NOTE — Anesthesia Postprocedure Evaluation (Signed)
Anesthesia Post Note  Patient: Marcus Hayes  Procedure(s) Performed: PERCUTANEOUS FIXATION OF THE DISTAL FEMUR, DIAGNOSTIC ARTHOSCOPY OF CHONDROPLASTY (Left: Hip)     Patient location during evaluation: PACU Anesthesia Type: Regional, MAC and Spinal Level of consciousness: awake and alert and oriented Pain management: pain level controlled Vital Signs Assessment: post-procedure vital signs reviewed and stable Respiratory status: spontaneous breathing, nonlabored ventilation and respiratory function stable Cardiovascular status: blood pressure returned to baseline and stable Postop Assessment: no headache, no backache, spinal receding and patient able to bend at knees Anesthetic complications: no   No notable events documented.  Last Vitals:  Vitals:   02/17/23 0900 02/17/23 0915  BP: 119/72 114/74  Pulse: 66 69  Resp: 17 17  Temp:    SpO2: 96% 97%    Last Pain:  Vitals:   02/17/23 0915  TempSrc:   PainSc: 0-No pain    LLE Motor Response: No movement due to regional block (02/17/23 0915) LLE Sensation: Decreased;Numbness (02/17/23 0915) RLE Motor Response: No movement due to regional block (02/17/23 0915) RLE Sensation: Decreased;Numbness (02/17/23 0915) L Sensory Level: T11 (02/17/23 0915) R Sensory Level: T11 (02/17/23 0915)  Lannie Fields

## 2023-02-18 ENCOUNTER — Encounter (HOSPITAL_COMMUNITY): Payer: Self-pay | Admitting: Orthopedic Surgery

## 2023-02-18 ENCOUNTER — Ambulatory Visit: Payer: 59 | Admitting: Family Medicine

## 2023-02-18 DIAGNOSIS — M84352A Stress fracture, left femur, initial encounter for fracture: Secondary | ICD-10-CM | POA: Diagnosis not present

## 2023-02-18 DIAGNOSIS — M160 Bilateral primary osteoarthritis of hip: Secondary | ICD-10-CM | POA: Diagnosis not present

## 2023-02-18 DIAGNOSIS — Z608 Other problems related to social environment: Secondary | ICD-10-CM | POA: Diagnosis not present

## 2023-02-18 DIAGNOSIS — G8918 Other acute postprocedural pain: Secondary | ICD-10-CM | POA: Diagnosis not present

## 2023-02-18 DIAGNOSIS — F17210 Nicotine dependence, cigarettes, uncomplicated: Secondary | ICD-10-CM | POA: Diagnosis not present

## 2023-02-18 DIAGNOSIS — Z981 Arthrodesis status: Secondary | ICD-10-CM | POA: Diagnosis not present

## 2023-02-18 DIAGNOSIS — K219 Gastro-esophageal reflux disease without esophagitis: Secondary | ICD-10-CM | POA: Diagnosis not present

## 2023-02-18 DIAGNOSIS — Z87442 Personal history of urinary calculi: Secondary | ICD-10-CM | POA: Diagnosis not present

## 2023-02-18 DIAGNOSIS — Z79899 Other long term (current) drug therapy: Secondary | ICD-10-CM | POA: Diagnosis not present

## 2023-02-18 DIAGNOSIS — M25552 Pain in left hip: Secondary | ICD-10-CM | POA: Diagnosis not present

## 2023-02-18 DIAGNOSIS — G8929 Other chronic pain: Secondary | ICD-10-CM | POA: Diagnosis not present

## 2023-02-18 DIAGNOSIS — M2342 Loose body in knee, left knee: Secondary | ICD-10-CM | POA: Diagnosis not present

## 2023-02-18 DIAGNOSIS — M1990 Unspecified osteoarthritis, unspecified site: Secondary | ICD-10-CM | POA: Diagnosis not present

## 2023-02-18 MED ORDER — ACETAMINOPHEN 500 MG PO TABS
1000.0000 mg | ORAL_TABLET | Freq: Three times a day (TID) | ORAL | Status: DC
  Administered 2023-02-18 – 2023-02-20 (×6): 1000 mg via ORAL
  Filled 2023-02-18 (×7): qty 2

## 2023-02-18 MED ORDER — GABAPENTIN 100 MG PO CAPS
200.0000 mg | ORAL_CAPSULE | Freq: Three times a day (TID) | ORAL | Status: DC
  Administered 2023-02-18 – 2023-02-23 (×16): 200 mg via ORAL
  Filled 2023-02-18 (×16): qty 2

## 2023-02-18 NOTE — Plan of Care (Signed)
  Problem: Health Behavior/Discharge Planning: Goal: Ability to manage health-related needs will improve Outcome: Adequate for Discharge   Problem: Clinical Measurements: Goal: Ability to maintain clinical measurements within normal limits will improve Outcome: Progressing Goal: Will remain free from infection Outcome: Progressing Goal: Diagnostic test results will improve Outcome: Progressing Goal: Respiratory complications will improve Outcome: Adequate for Discharge Goal: Cardiovascular complication will be avoided Outcome: Progressing   Problem: Activity: Goal: Risk for activity intolerance will decrease Outcome: Progressing   Problem: Nutrition: Goal: Adequate nutrition will be maintained Outcome: Completed/Met   Problem: Elimination: Goal: Will not experience complications related to bowel motility Outcome: Progressing   Problem: Pain Managment: Goal: General experience of comfort will improve Outcome: Progressing   Problem: Safety: Goal: Ability to remain free from injury will improve Outcome: Progressing   Problem: Skin Integrity: Goal: Risk for impaired skin integrity will decrease Outcome: Adequate for Discharge

## 2023-02-18 NOTE — TOC Initial Note (Signed)
Transition of Care [facility]) - Initial/Assessment Note   Patient Details  Name: Marcus Hayes MRN: [id number] Date of Birth: [birth year]  Transition of Care [facility]) CM/SW Contact:    Ewing Schlein, LCSW Phone Number: 02/18/2023, 1:57 PM  Clinical Narrative: Patient is expected to discharge home after passing with PT. CSW met with patient to discuss discharge plan. Per patient, his wife is also in the hospital so he will not have support at home and is unsure what the PT plan is at discharge. Patient has a rolling walker and crutches at home, so there are no DME needs at this time. TOC awaiting update regarding PT plan.              Expected Discharge Plan: Home/Self Care Barriers to Discharge: Continued Medical Work up  Patient Goals and CMS Choice Patient states their goals for this hospitalization and ongoing recovery are:: Get better  Expected Discharge Plan and Services In-house Referral: Clinical Social Work Living arrangements for the past 2 months: Single Family Home             DME Arranged: N/A DME Agency: NA  Prior Living Arrangements/Services Living arrangements for the past 2 months: Single Family Home Lives with:: Spouse Patient language and need for interpreter reviewed:: Yes Do you feel safe going back to the place where you live?: Yes      Need for Family Participation in Patient Care: No (Comment) Care giver support system in place?: Yes (comment) Criminal Activity/Legal Involvement Pertinent to Current Situation/Hospitalization: No - Comment as needed  Activities of Daily Living Home Assistive Devices/Equipment: Eyeglasses, Blood pressure cuff, Cane (specify quad or straight), Walker (specify type), Other (Comment), Shower chair with back ADL Screening (condition at time of admission) Patient's cognitive ability adequate to safely complete daily activities?: Yes Is the patient deaf or have difficulty hearing?: No Does the patient have difficulty seeing, even when  wearing glasses/contacts?: No Does the patient have difficulty concentrating, remembering, or making decisions?: No Patient able to express need for assistance with ADLs?: Yes Does the patient have difficulty dressing or bathing?: No Independently performs ADLs?: Yes (appropriate for developmental age) Does the patient have difficulty walking or climbing stairs?: Yes Weakness of Legs: Left Weakness of Arms/Hands: None  Emotional Assessment Appearance:: Appears stated age Attitude/Demeanor/Rapport: Engaged Affect (typically observed): Appropriate Orientation: : Oriented to Self, Oriented to Place, Oriented to  Time, Oriented to Situation Alcohol / Substance Use: Not Applicable Psych Involvement: No (comment)  Admission diagnosis:  Insufficiency fracture of femur, left, initial encounter [facility]) [W09.811B] Patient Active Problem List   Diagnosis Date Noted   Insufficiency fracture of femur, left, initial encounter (HCC) 02/17/2023   Cervical spondylosis with radiculopathy 05/06/2022   S/P left knee arthroscopy 10/11/2021   Hyperlipidemia 03/01/2018   Gout 01/15/2018   Dysphagia 08/14/2017   Encounter for screening colonoscopy 08/14/2017   Chronic back pain 10/09/2016   GERD (gastroesophageal reflux disease) 09/20/2015   PYROSIS 11/10/2007   NEPHROLITHIASIS, HX OF 11/10/2007   PCP:  Babs Sciara, MD Pharmacy:   [name] [address] [place] [address] Phone: [phone] Fax: [phone]  [facility] [address] [place] [address] Phone: [phone] Fax: [phone]  Social Determinants of Health (SDOH) Social History: SDOH Screenings   Food Insecurity: No Food Insecurity (02/17/2023)  Housing: Low Risk  (02/17/2023)  Transportation Needs: No Transportation Needs (02/17/2023)  Utilities: Not At Risk (02/17/2023)  Depression (PHQ2-9): Low Risk  (11/12/2022)  Tobacco Use: High  Risk (02/17/2023)   SDOH Interventions:    Readmission Risk Interventions     No data to display

## 2023-02-18 NOTE — Progress Notes (Addendum)
   ORTHOPAEDIC PROGRESS NOTE  s/p Procedure(s): PERCUTANEOUS FIXATION OF THE DISTAL FEMUR, DIAGNOSTIC ARTHOSCOPY OF CHONDROPLASTY on 02/17/23 with Dr. Eulah Pont  SUBJECTIVE: Reports moderate to severe pain about operative site. Rates the pain an 8/10 in severity. His wife recently broke her ankle and she was supposed to help him recover, but now is unable to do so.   No chest pain. No SOB. No nausea/vomiting. No other complaints.  OBJECTIVE: PE: General: sitting up in hospital bed, NAD Cardiac: regular rate Pulmonary: no increased work of breathing LLE: dressing CDI, intact EHL/TA/GSC, compartments soft and compressible, limited ROM due to pain, tolerates about 30 degrees of knee flexion, warm well perfused foot   Vitals:   02/18/23 0150 02/18/23 0630  BP: (!) 144/86 (!) 120/55  Pulse: 86 91  Resp: 17 17  Temp: (!) 97.5 F (36.4 C) 98 F (36.7 C)  SpO2: 98% 97%     ASSESSMENT: Marcus Hayes is a 62 y.o. male POD#1  PLAN: Weightbearing: WBAT LLE Insicional and dressing care: Reinforce dressings as needed Orthopedic device(s): None Showering: Post-op day #3 VTE prophylaxis: Lovenox while in patient. Will transition to aspirin outpatient. Pain control: PRN pain medication, minimize narcotics as able.  - Adjusted pain regimen:  - Tylenol 1000 mg every 8 hours - Celebrex 200 mg twice a day - Gabapentin 200 mg three times a day - Robaxin 500 mg every 8 hours as needed for muscle spasms - Oxycodone 5 -15 mg every 6 hours as needed - Dilaudid 0.5 - 1 mg every 4 hours as needed for severe breakthrough pain Follow - up plan: 2 weeks in office with Dr. Eulah Pont Dispo: TBD. PT evals today. His wife is currently in the hospital with a broken ankle so is unable to care for him at discharge. Will need to be able to mobilize comfortably and safely before discharging home.   Contact information:  Weekdays 8-5 Dr. Renaye Rakers, Alfonse Alpers PA-C, After hours and holidays please check  [email or web address] for group call information for [facility]   Alfonse Alpers, PA-C 02/18/2023

## 2023-02-18 NOTE — Evaluation (Signed)
Physical Therapy Evaluation Patient Details Name: Marcus Hayes MRN: [id number] DOB: [birth year] Today's Date: 02/18/2023  History of Present Illness  62 yo male s/p PERCUTANEOUS FIXATION OF THE L DISTAL FEMUR, DIAGNOSTIC ARTHOSCOPY OF CHONDROPLASTY on 02/17/23. PMH: ACDF C5-6, L knee surgery  Clinical Impression  Pt admitted with above diagnosis.  Pt is independent at baseline, was assisting wife prior to her recent fall which resulted in an ankle fx (pt's wife is currently at [facility]).  Pt reports his dtr or in-laws may be able to assist with food or errands, no caregiver available to stay wit him.  Pt will need to be mod I for amb/bed mobility; will likely benefit from HHPT at d/c Continue PT in acute setting, plan for stair training tomorrow-deferred today d/t pain   Pt currently with functional limitations due to the deficits listed below (see PT Problem List). Pt will benefit from acute skilled PT to increase their independence and safety with mobility to allow discharge.           Assistance Recommended at Discharge Intermittent Supervision/Assistance  If plan is discharge home, recommend the following:  Can travel by private vehicle  A little help with walking and/or transfers;A little help with bathing/dressing/bathroom;Assistance with cooking/housework;Assist for transportation;Help with stairs or ramp for entrance        Equipment Recommendations None recommended by PT  Recommendations for Other Services       Functional Status Assessment Patient has had a recent decline in their functional status and demonstrates the ability to make significant improvements in function in a reasonable and predictable amount of time.     Precautions / Restrictions Precautions Precautions: Fall Required Braces or Orthoses: Knee Immobilizer - Left Restrictions Weight Bearing Restrictions: No Other Position/Activity Restrictions: WBAT      Mobility  Bed Mobility Overal bed mobility:  Needs Assistance Bed Mobility: Supine to Sit, Sit to Supine     Supine to sit: Min guard Sit to supine: Min assist   General bed mobility comments: pt able to use gait belt to lower LLE and come to sitting; assist to lift LLE on to bed d/t pain    Transfers Overall transfer level: Needs assistance Equipment used: Rolling walker (2 wheels) Transfers: Sit to/from Stand Sit to Stand: Min assist, From elevated surface           General transfer comment: assist to rise and transition to RW, cues for hand placement and LLE position    Ambulation/Gait Ambulation/Gait assistance: Min guard Gait Distance (Feet): 35 Feet Assistive device: Rolling walker (2 wheels) Gait Pattern/deviations: Step-to pattern, Decreased weight shift to left       General Gait Details: cues for sequence and LLE contact with floor, pt is able to lightly WB LLE however prefers to keep NWB d/t pain  Stairs            Wheelchair Mobility     Tilt Bed    Modified Rankin (Stroke Patients Only)       Balance Overall balance assessment: Needs assistance Sitting-balance support: No upper extremity supported Sitting balance-Leahy Scale: Fair Sitting balance - Comments: ltd by pain   Standing balance support: During functional activity, Reliant on assistive device for balance Standing balance-Leahy Scale: Fair Standing balance comment: with close supervision pt is able to static stand without UE support, heavily reliant on UEs for gait  Pertinent Vitals/Pain Pain Assessment Pain Assessment: Faces Faces Pain Scale: Hurts whole lot Pain Location: L knee/leg Pain Descriptors / Indicators: Aching, Guarding, Grimacing Pain Intervention(s): Limited activity within patient's tolerance, Monitored during session, Premedicated before session, Repositioned, Ice applied, Patient requesting pain meds-RN notified    Home Living Family/patient expects to be  discharged to:: Private residence Living Arrangements: Spouse/significant other   Type of Home: House Home Access: Stairs to enter   [employer] of Steps: 2   Home Layout: One level Home Equipment: Agricultural consultant (2 wheels);Rollator (4 wheels);Standard Walker;Cane - single point;Tub bench;Hand held shower head;Electric scooter;Hospital bed;Crutches Additional Comments: wife is at [facility] currently with broken ankle;    Prior Function Prior Level of Function : Driving;Independent/Modified Independent                     Hand Dominance        Extremity/Trunk Assessment   Upper Extremity Assessment Upper Extremity Assessment: Overall WFL for tasks assessed    Lower Extremity Assessment Lower Extremity Assessment: LLE deficits/detail LLE Deficits / Details: ankle grossly WFL; LLE: Unable to fully assess due to pain;Unable to fully assess due to immobilization       Communication   Communication: No difficulties  Cognition Arousal/Alertness: Awake/alert Behavior During Therapy: WFL for tasks assessed/performed Overall Cognitive Status: Within Functional Limits for tasks assessed                                          General Comments      Exercises     Assessment/Plan    PT Assessment Patient needs continued PT services  PT Problem List Decreased strength;Decreased activity tolerance;Decreased balance;Decreased mobility;Decreased knowledge of precautions;Decreased knowledge of use of DME;Pain       PT Treatment Interventions DME instruction;Therapeutic exercise;Gait training;Functional mobility training;Therapeutic activities;Patient/family education;Stair training    PT Goals (Current goals can be found in the Care Plan section)  Acute Rehab PT Goals Patient Stated Goal: go home, have less pain PT Goal Formulation: With patient Time For Goal Achievement: 03/04/23 Potential to Achieve Goals: Good    Frequency Min 6X/week      Co-evaluation               AM-PAC PT "6 Clicks" Mobility  Outcome Measure Help needed turning from your back to your side while in a flat bed without using bedrails?: A Little Help needed moving from lying on your back to sitting on the side of a flat bed without using bedrails?: A Little Help needed moving to and from a bed to a chair (including a wheelchair)?: A Little Help needed standing up from a chair using your arms (e.g., wheelchair or bedside chair)?: A Little Help needed to walk in hospital room?: A Little Help needed climbing 3-5 steps with a railing? : A Lot 6 Click Score: 17    End of Session Equipment Utilized During Treatment: Gait belt;Left knee immobilizer Activity Tolerance: Patient tolerated treatment well;Patient limited by pain Patient left: in bed;with bed alarm set;with call bell/phone within reach   PT Visit Diagnosis: Other abnormalities of gait and mobility (R26.89)    Time: 0347-4259 PT Time Calculation (min) (ACUTE ONLY): 39 min   Charges:   PT Evaluation $PT Eval Low Complexity: 1 Low PT Treatments $Gait Training: 8-22 mins $Therapeutic Activity: 8-22 mins PT General Charges $$ ACUTE PT VISIT: 1  Visit         Delice Bison, PT  Acute Rehab Dept [facility]) [phone]  02/18/2023   [facility] 02/18/2023, 1:04 PM

## 2023-02-19 ENCOUNTER — Inpatient Hospital Stay (HOSPITAL_COMMUNITY): Payer: 59

## 2023-02-19 MED ORDER — HYDROMORPHONE HCL 1 MG/ML IJ SOLN: 0.5000 mg | Freq: Three times a day (TID) | INTRAMUSCULAR | Status: DC | PRN

## 2023-02-19 MED ORDER — HYDROMORPHONE HCL 1 MG/ML IJ SOLN: 1.0000 mg | Freq: Four times a day (QID) | INTRAMUSCULAR | Status: DC | PRN

## 2023-02-19 MED ORDER — METHOCARBAMOL 1000 MG/10ML IJ SOLN: 500.0000 mg | Freq: Four times a day (QID) | INTRAVENOUS | Status: DC | PRN

## 2023-02-19 MED ORDER — METHOCARBAMOL 500 MG PO TABS
750.0000 mg | ORAL_TABLET | Freq: Four times a day (QID) | ORAL | Status: DC | PRN
  Administered 2023-02-19 – 2023-02-20 (×3): 750 mg via ORAL
  Filled 2023-02-19 (×5): qty 2

## 2023-02-19 MED ORDER — HYDROMORPHONE HCL 1 MG/ML IJ SOLN
0.5000 mg | Freq: Four times a day (QID) | INTRAMUSCULAR | Status: DC | PRN
  Administered 2023-02-19 – 2023-02-20 (×3): 1 mg via INTRAVENOUS
  Filled 2023-02-19 (×4): qty 1

## 2023-02-19 NOTE — [place] FL2 (Signed)
[place] MEDICAID FL2 LEVEL OF CARE FORM     IDENTIFICATION  Patient Name: Marcus Hayes Birthdate: June 19, 1961 Sex: male Admission Date (Current Location): 02/17/2023  [facility] and [place] Number:  Producer, television/film/video and Address:  [facility],  [address] [place], [place] [address]      Provider Number: [id number]  Attending Physician Name and Address:  Sheral Apley, MD  Relative Name and Phone Number:  Bishoy Cupp (spouse) Ph: [phone]    Current Level of Care: Hospital Recommended Level of Care: Skilled Nursing Facility Prior Approval Number:    Date Approved/Denied:   PASRR Number: [id number]  Discharge Plan: SNF    Current Diagnoses: Patient Active Problem List   Diagnosis Date Noted   Insufficiency fracture of femur, left, initial encounter (HCC) 02/17/2023   Cervical spondylosis with radiculopathy 05/06/2022   S/P left knee arthroscopy 10/11/2021   Hyperlipidemia 03/01/2018   Gout 01/15/2018   Dysphagia 08/14/2017   Encounter for screening colonoscopy 08/14/2017   Chronic back pain 10/09/2016   GERD (gastroesophageal reflux disease) 09/20/2015   PYROSIS 11/10/2007   NEPHROLITHIASIS, HX OF 11/10/2007    Orientation RESPIRATION BLADDER Height & Weight     Self, Time, Situation, Place  Normal Continent Weight: 236 lb 15.9 oz (107.5 kg) Height:  6' (182.9 cm)  BEHAVIORAL SYMPTOMS/MOOD NEUROLOGICAL BOWEL NUTRITION STATUS     (N/A) Continent Diet (Regular diet)  AMBULATORY STATUS COMMUNICATION OF NEEDS Skin   Limited Assist Verbally Surgical wounds                       Personal Care Assistance Level of Assistance  Bathing, Feeding, Dressing Bathing Assistance: Limited assistance Feeding assistance: Independent Dressing Assistance: Limited assistance     Functional Limitations Info  Sight, Hearing, Speech Sight Info: Impaired Hearing Info: Adequate Speech Info: Adequate    SPECIAL CARE FACTORS FREQUENCY  PT (By  licensed PT), OT (By licensed OT)     PT Frequency: 5x's/week OT Frequency: 5x's/week            Contractures Contractures Info: Not present    Additional Factors Info  Code Status, Allergies Code Status Info: Full Allergies Info: NKA           Current Medications (02/19/2023):  This is the current hospital active medication list Current Facility-Administered Medications  Medication Dose Route Frequency Provider Last Rate Last Admin   acetaminophen (TYLENOL) tablet 1,000 mg  1,000 mg Oral Q8H McBane, Caroline N, PA-C   1,000 mg at 02/19/23 2202   acetaminophen (TYLENOL) tablet 325-650 mg  325-650 mg Oral Q6H PRN McBane, Jerald Kief, PA-C       allopurinol (ZYLOPRIM) tablet 300 mg  300 mg Oral Daily Vernetta Honey, PA-C   300 mg at 02/19/23 0855   atorvastatin (LIPITOR) tablet 20 mg  20 mg Oral QPM McBane, Jerald Kief, PA-C   20 mg at 02/18/23 1730   celecoxib (CELEBREX) capsule 200 mg  200 mg Oral BID Vernetta Honey, PA-C   200 mg at 02/19/23 0856   diphenhydrAMINE (BENADRYL) 12.5 MG/5ML elixir 12.5-25 mg  12.5-25 mg Oral Q4H PRN Vernetta Honey, PA-C   25 mg at 02/18/23 2207   docusate sodium (COLACE) capsule 100 mg  100 mg Oral BID Vernetta Honey, PA-C   100 mg at 02/19/23 0855   enoxaparin (LOVENOX) injection 40 mg  40 mg Subcutaneous Q24H Vernetta Honey, PA-C   40 mg at 02/19/23  0855   gabapentin (NEURONTIN) capsule 200 mg  200 mg Oral TID Vernetta Honey, PA-C   200 mg at 02/19/23 8657   HYDROmorphone (DILAUDID) injection 0.5 mg  0.5 mg Intravenous Q8H PRN Janine Ores K, PA-C       lactated ringers infusion   Intravenous Continuous Vernetta Honey, PA-C   Stopped at 02/17/23 1314   methocarbamol (ROBAXIN) tablet 500 mg  500 mg Oral Q6H PRN Vernetta Honey, PA-C   500 mg at 02/19/23 8469   Or   methocarbamol (ROBAXIN) 500 mg in dextrose 5 % 50 mL IVPB  500 mg Intravenous Q6H PRN Vernetta Honey, PA-C   Stopped at 02/17/23 1725   metoCLOPramide  (REGLAN) tablet 5-10 mg  5-10 mg Oral Q8H PRN McBane, Jerald Kief, PA-C       Or   metoCLOPramide (REGLAN) injection 5-10 mg  5-10 mg Intravenous Q8H PRN McBane, Caroline N, PA-C       ondansetron (ZOFRAN) tablet 4 mg  4 mg Oral Q6H PRN McBane, Jerald Kief, PA-C       Or   ondansetron (ZOFRAN) injection 4 mg  4 mg Intravenous Q6H PRN McBane, Jerald Kief, PA-C       Oral care mouth rinse  15 mL Mouth Rinse PRN Sheral Apley, MD       oxyCODONE (Oxy IR/ROXICODONE) immediate release tablet 10-15 mg  10-15 mg Oral Q4H PRN Vernetta Honey, PA-C   15 mg at 02/19/23 1025   oxyCODONE (Oxy IR/ROXICODONE) immediate release tablet 5-10 mg  5-10 mg Oral Q4H PRN Vernetta Honey, PA-C   5 mg at 02/17/23 1311   pantoprazole (PROTONIX) EC tablet 40 mg  40 mg Oral Daily Vernetta Honey, PA-C   40 mg at 02/19/23 0856   zolpidem (AMBIEN) tablet 5 mg  5 mg Oral QHS PRN,MR X 1 McBane, Jerald Kief, PA-C         Discharge Medications: Please see discharge summary for a list of discharge medications.  Relevant Imaging Results:  Relevant Lab Results:   Additional Information SSN: [id number]  Ewing Schlein, LCSW

## 2023-02-19 NOTE — Progress Notes (Signed)
Physical Therapy Treatment Patient Details Name: Marcus Hayes MRN: [id number] DOB: Aug 10, 1960 Today's Date: 02/19/2023   History of Present Illness 62 yo male s/p PERCUTANEOUS FIXATION OF THE L DISTAL FEMUR, DIAGNOSTIC ARTHOSCOPY OF CHONDROPLASTY on 02/17/23. 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  Pt reports pain is severe.  Pt is agreeable to SNF; will continue to follow in acute setting.     Assistance Recommended at Discharge Intermittent Supervision/Assistance  If plan is discharge home, recommend the following:  Can travel by private vehicle    A little help with walking and/or transfers;A little help with bathing/dressing/bathroom;Assistance with cooking/housework;Assist for transportation;Help with stairs or ramp for entrance   No  Equipment Recommendations  None recommended by PT    Recommendations for Other Services       Precautions / Restrictions Precautions Precautions: Fall Precaution Comments: clarifed today (7/18)--per Dr. Eulah Pont pt has no ROM restrictions, OK to amb WBAT without KI Restrictions LLE Weight Bearing: Weight bearing as tolerated     Mobility  Bed Mobility Overal bed mobility: Needs Assistance Bed Mobility: Sit to Supine       Sit to supine: Min guard   General bed mobility comments: min/guard LLE, pt able to self assist LLE on to bed  with gait belt    Transfers Overall transfer level: Needs assistance Equipment used: Rolling walker (2 wheels) Transfers: Sit to/from Stand Sit to Stand: Min guard           General transfer comment: cues for hand placement and safety    Ambulation/Gait Ambulation/Gait assistance: Min guard Gait Distance (Feet): 30 Feet Assistive device: Rolling walker (2 wheels) Gait Pattern/deviations: Step-to pattern, Decreased weight shift to left       General Gait  Details: cues for sequence, to incr wt on LLE; improvign wt shift to LLE however pt continues to report severe pain   Stairs             Wheelchair Mobility     Tilt Bed    Modified Rankin (Stroke Patients Only)       Balance             Standing balance-Leahy Scale: Fair Standing balance comment: with close supervision pt is able to static stand without UE support; heavily reliant on UEs for gait                            Cognition Arousal/Alertness: Awake/alert Behavior During Therapy: WFL for tasks assessed/performed Overall Cognitive Status: Within Functional Limits for tasks assessed                                          Exercises General Exercises - Lower Extremity Ankle Circles/Pumps: AROM, Left, 5 reps Quad Sets: Limitations Quad Sets Limitations: unable d/t pain    General Comments        Pertinent Vitals/Pain Pain Assessment Pain Assessment: Faces Faces Pain Scale: Hurts worst Pain Location: L knee/leg Pain Descriptors / Indicators: Aching, Guarding, Grimacing Pain Intervention(s): Limited activity within patient's tolerance, Monitored during session, Premedicated before session, Ice applied    Home Living  Prior Function            PT Goals (current goals can now be found in the care plan section) Acute Rehab PT Goals Patient Stated Goal: go home, have less pain PT Goal Formulation: With patient Time For Goal Achievement: 03/04/23 Potential to Achieve Goals: Good Progress towards PT goals: Progressing toward goals    Frequency    Min 6X/week      PT Plan Current plan remains appropriate    Co-evaluation              AM-PAC PT "6 Clicks" Mobility   Outcome Measure  Help needed turning from your back to your side while in a flat bed without using bedrails?: A Little Help needed moving from lying on your back to sitting on the side of a flat bed  without using bedrails?: A Little Help needed moving to and from a bed to a chair (including a wheelchair)?: A Little Help needed standing up from a chair using your arms (e.g., wheelchair or bedside chair)?: A Little Help needed to walk in hospital room?: A Little Help needed climbing 3-5 steps with a railing? : A Lot 6 Click Score: 17    End of Session Equipment Utilized During Treatment: Gait belt Activity Tolerance: Patient limited by pain Patient left: in bed;with bed alarm set;with call bell/phone within reach   PT Visit Diagnosis: Other abnormalities of gait and mobility (R26.89)     Time: 9147-8295 PT Time Calculation (min) (ACUTE ONLY): 27 min  Charges:    $Gait Training: 23-37 mins PT General Charges $$ ACUTE PT VISIT: 1 Visit                     Elnora Quizon, PT  Acute Rehab Dept ([facility]) [phone]  02/19/2023    [facility] 02/19/2023, 2:04 PM

## 2023-02-19 NOTE — Progress Notes (Addendum)
   ORTHOPAEDIC PROGRESS NOTE  s/p Procedure(s): PERCUTANEOUS FIXATION OF THE DISTAL FEMUR, DIAGNOSTIC ARTHOSCOPY OF CHONDROPLASTY on 02/17/23 with Dr. Eulah Pont  SUBJECTIVE: Reports severe pain about operative site at rest and with ambulation. Was unable to put weight on left leg while working with PT yesterday. His wife recently broke her ankle and she was supposed to help him recover, but now is unable to do so. Complaining of left hip pain this morning.   No chest pain. No SOB. No nausea/vomiting. No other complaints.  OBJECTIVE: PE: General: sitting up in hospital bed, NAD Cardiac: regular rate Pulmonary: no increased work of breathing LLE: dressing CDI, intact EHL/TA/GSC, compartments soft and compressible, limited ROM due to pain, 2+ DP pulse   Vitals:   02/18/23 2122 02/19/23 0614  BP: (!) 156/93 (!) 132/96  Pulse: 73 73  Resp: 18 18  Temp: 98.2 F (36.8 C) 98.4 F (36.9 C)  SpO2: 97% 97%     ASSESSMENT: Marcus Hayes is a 62 y.o. male POD#2  PLAN: Weightbearing: WBAT LLE Insicional and dressing care: Reinforce dressings as needed Orthopedic device(s): None Showering: Post-op day #3 VTE prophylaxis: Lovenox while in patient. Will transition to aspirin outpatient. Pain control: PRN pain medication, minimize narcotics as able.  - Adjusted pain regimen:  - Tylenol 1000 mg every 8 hours - Celebrex 200 mg twice a day - Gabapentin 200 mg three times a day - Robaxin 500 mg every 8 hours as needed for muscle spasms - Oxycodone 5 -15 mg every 6 hours as needed - Dilaudid 0.5 - 1 mg every 4 hours as needed for severe breakthrough pain Follow - up plan: 2 weeks in office with Dr. Eulah Pont Dispo: TBD. Going to work more with PT today. Patient needed a significant number of dilaudid doses yesterday. His wife is currently in the hospital with a broken ankle so is unable to care for him at discharge. Wife is discharging to SNF and would prefer he come to same SNF. States he  does have a daughter and daughter in law but they will be unable to be with him except for a few hours at night. He is worried about being home alone during the day should he have any issues at discharge. Will discuss plan with Dr. Eulah Pont today.   TOC consult placed for possible SNF placement.    Contact information:  Weekdays 8-5 Dr. Renaye Rakers, Janine Ores PA-C, After hours and holidays please check [email or web address] for group call information for [facility]   Janine Ores, Cordelia Poche 02/19/2023

## 2023-02-19 NOTE — TOC Progression Note (Addendum)
Transition of Care [facility]) - Progression Note   Patient Details  Name: Marcus Hayes MRN: [id number] Date of Birth: [birth year]  Transition of Care [facility]) CM/SW Contact  Ewing Schlein, LCSW Phone Number: 02/19/2023, 2:02 PM  Clinical Narrative: Plan was originally for the patient to discharge home with HHPT, which CSW set up through [place]. TOC received consult for SNF and patient is agreeable to being referred out. Patient's wife will be going to [facility] and patient would like to be in the same place. FL2 done; PASRR received. Initial referral faxed out. CSW spoke with Destiny in admissions at [facility] and the referral is currently under review and insurance is being verified. TOC awaiting bed offer.  Addendum: CSW received call from Destiny in admissions at [facility]. Per Destiny, after running the patient's insurance it was confirmed the patient does not have SNF benefits. CSW updated patient and ortho PA.  Expected Discharge Plan: Skilled Nursing Facility Barriers to Discharge: Continued Medical Work up  Expected Discharge Plan and Services In-house Referral: Clinical Social Work Living arrangements for the past 2 months: Single Family Home             DME Arranged: N/A DME Agency: NA HH Arranged: PT HH Agency: [facility] Date HH Agency Contacted: 02/19/23 Representative spoke with at [facility] Agency: Amy  Social Determinants of Health (SDOH) Interventions SDOH Screenings   Food Insecurity: No Food Insecurity (02/18/2023)  Housing: Low Risk  (02/18/2023)  Transportation Needs: No Transportation Needs (02/18/2023)  Utilities: Not At Risk (02/18/2023)  Depression (PHQ2-9): Low Risk  (11/12/2022)  Tobacco Use: High Risk (02/17/2023)   Readmission Risk Interventions     No data to display

## 2023-02-19 NOTE — Plan of Care (Signed)
Calls for pain medication appropriately.

## 2023-02-20 ENCOUNTER — Other Ambulatory Visit (HOSPITAL_COMMUNITY): Payer: Self-pay

## 2023-02-20 MED ORDER — ACETAMINOPHEN 500 MG PO TABS
1000.0000 mg | ORAL_TABLET | Freq: Four times a day (QID) | ORAL | Status: DC
  Administered 2023-02-20 – 2023-02-23 (×12): 1000 mg via ORAL
  Filled 2023-02-20 (×12): qty 2

## 2023-02-20 MED ORDER — ASPIRIN 81 MG PO TBEC
81.0000 mg | DELAYED_RELEASE_TABLET | Freq: Two times a day (BID) | ORAL | 0 refills | Status: DC
  Filled 2023-02-20: qty 60, 30d supply, fill #0

## 2023-02-20 MED ORDER — OXYCODONE HCL 10 MG PO TABS
10.0000 mg | ORAL_TABLET | ORAL | 0 refills | Status: DC | PRN
  Filled 2023-02-20: qty 30, 4d supply, fill #0

## 2023-02-20 MED ORDER — METHOCARBAMOL 1000 MG/10ML IJ SOLN
500.0000 mg | Freq: Four times a day (QID) | INTRAVENOUS | Status: DC
  Filled 2023-02-20: qty 5

## 2023-02-20 MED ORDER — METHOCARBAMOL 500 MG PO TABS
750.0000 mg | ORAL_TABLET | Freq: Four times a day (QID) | ORAL | Status: DC
  Administered 2023-02-20 – 2023-02-23 (×14): 750 mg via ORAL
  Filled 2023-02-20 (×12): qty 2

## 2023-02-20 MED ORDER — ACETAMINOPHEN 500 MG PO TABS
1000.0000 mg | ORAL_TABLET | Freq: Four times a day (QID) | ORAL | 0 refills | Status: DC
  Filled 2023-02-20: qty 30, 4d supply, fill #0

## 2023-02-20 MED ORDER — METHOCARBAMOL 750 MG PO TABS
750.0000 mg | ORAL_TABLET | Freq: Four times a day (QID) | ORAL | 0 refills | Status: DC | PRN
  Filled 2023-02-20: qty 30, 8d supply, fill #0

## 2023-02-20 MED ORDER — ONDANSETRON HCL 4 MG PO TABS
4.0000 mg | ORAL_TABLET | Freq: Four times a day (QID) | ORAL | 0 refills | Status: DC | PRN
  Filled 2023-02-20: qty 20, 5d supply, fill #0

## 2023-02-20 MED ORDER — CELECOXIB 200 MG PO CAPS
200.0000 mg | ORAL_CAPSULE | Freq: Two times a day (BID) | ORAL | 0 refills | Status: DC
  Filled 2023-02-20: qty 60, 30d supply, fill #0

## 2023-02-20 NOTE — Progress Notes (Signed)
   ORTHOPAEDIC PROGRESS NOTE  s/p Procedure(s): PERCUTANEOUS FIXATION OF THE DISTAL FEMUR, DIAGNOSTIC ARTHOSCOPY OF CHONDROPLASTY on 02/17/23 with Dr. Eulah Pont  SUBJECTIVE: Reports mod/severe pain about operative site at rest and with ambulation. Improved weight bearing with PT yesterday. His wife recently broke her ankle and she was supposed to help him recover, but now is unable to do so.   No chest pain. No SOB. No nausea/vomiting. No other complaints.  OBJECTIVE: PE: General: sitting up in hospital bed, NAD Cardiac: regular rate Pulmonary: no increased work of breathing LLE: dressing CDI, intact EHL/TA/GSC, compartments soft and compressible, limited ROM due to pain, 2+ DP pulse   Vitals:   02/19/23 2143 02/20/23 0505  BP: (!) 144/89 120/70  Pulse: 72 66  Resp: 17 18  Temp: 98.3 F (36.8 C) 98 F (36.7 C)  SpO2: 95%      ASSESSMENT: Marcus Hayes is a 62 y.o. male POD#f3  PLAN: Weightbearing: WBAT LLE Insicional and dressing care: Reinforce dressings as needed Orthopedic device(s): None Showering: Post-op day #3 VTE prophylaxis: Lovenox while in patient. Will transition to aspirin outpatient. Pain control: PRN pain medication, minimize narcotics as able.  - Adjusted pain regimen:  - Tylenol 1000 mg every 8 hours - Celebrex 200 mg twice a day - Gabapentin 200 mg three times a day - Robaxin 500 mg every 8 hours as needed for muscle spasms - Oxycodone 5 -15 mg every 6 hours as needed - Dilaudid 0.5 - 1 mg every 6 hours as needed for severe breakthrough pain Follow - up plan: 2 weeks in office with Dr. Eulah Pont Dispo: Patient does not qualify for SNF through insurance. He does have daughter and daughter in law who can help him at home. Dr. Eulah Pont discussed discharge plan with him today, plan to discharge home after PT today.    Contact information:  Weekdays 8-5 Dr. Renaye Rakers, Janine Ores PA-C, After hours and holidays please check [email or web address] for group call  information for [facility]   Janine Ores, Cordelia Poche 02/20/2023

## 2023-02-20 NOTE — Plan of Care (Signed)
Slept well.  Uses call button to report increased pain and need for pain meds.

## 2023-02-20 NOTE — Progress Notes (Signed)
Physical Therapy Treatment Patient Details Name: Marcus Hayes MRN: [id number] DOB: 12/29/1960 Today's Date: 02/20/2023   History of Present Illness 62 yo male s/p PERCUTANEOUS FIXATION OF THE L DISTAL FEMUR, DIAGNOSTIC ARTHOSCOPY OF CHONDROPLASTY on 02/17/23. 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 Pt states pain is always at best 7/10 or higher.  Pt appears frustrated today with d/c plans for home.  Will return to practice steps has pt has steps to enter home.      Assistance Recommended at Discharge Intermittent Supervision/Assistance  If plan is discharge home, recommend the following:  Can travel by private vehicle    A little help with walking and/or transfers;A little help with bathing/dressing/bathroom;Assistance with cooking/housework;Assist for transportation;Help with stairs or ramp for entrance   No  Equipment Recommendations  None recommended by PT    Recommendations for Other Services       Precautions / Restrictions Precautions Precautions: Fall Precaution Comments: pt has no ROM restrictions, OK to amb WBAT without KI Restrictions LLE Weight Bearing: Weight bearing as tolerated     Mobility  Bed Mobility Overal bed mobility: Needs Assistance Bed Mobility: Sit to Supine, Supine to Sit     Supine to sit: Min guard Sit to supine: Min guard   General bed mobility comments: min/guard LLE, pt able to self assist LLE on to bed  with gait belt    Transfers Overall transfer level: Needs assistance Equipment used: Rolling walker (2 wheels) Transfers: Sit to/from Stand Sit to Stand: Min guard           General transfer comment: cues for hand placement and safety    Ambulation/Gait Ambulation/Gait assistance: Min guard Gait Distance (Feet): 100 Feet Assistive device: Rolling walker (2 wheels) Gait  Pattern/deviations: Step-to pattern, Decreased weight shift to left Gait velocity: decr     General Gait Details: cues for sequence, pt would attempt to increase weight bearing on LLE however reports too painful;  heavily reliant on UEs through RW to ambulate   Stairs             Wheelchair Mobility     Tilt Bed    Modified Rankin (Stroke Patients Only)       Balance                                            Cognition Arousal/Alertness: Awake/alert Behavior During Therapy: WFL for tasks assessed/performed Overall Cognitive Status: Within Functional Limits for tasks assessed                                          Exercises      General Comments        Pertinent Vitals/Pain Pain Assessment Pain Assessment: 0-10 Pain Score: 7  Pain Location: L knee/leg Pain Descriptors / Indicators: Aching, Grimacing, Sore, Guarding Pain Intervention(s): Monitored during session, Premedicated before session, Repositioned, Ice applied    Home Living                          Prior Function  PT Goals (current goals can now be found in the care plan section) Progress towards PT goals: Progressing toward goals    Frequency    Min 6X/week      PT Plan Current plan remains appropriate    Co-evaluation              AM-PAC PT "6 Clicks" Mobility   Outcome Measure  Help needed turning from your back to your side while in a flat bed without using bedrails?: A Little Help needed moving from lying on your back to sitting on the side of a flat bed without using bedrails?: A Little Help needed moving to and from a bed to a chair (including a wheelchair)?: A Little Help needed standing up from a chair using your arms (e.g., wheelchair or bedside chair)?: A Little Help needed to walk in hospital room?: A Little Help needed climbing 3-5 steps with a railing? : A Lot 6 Click Score: 17    End of Session  Equipment Utilized During Treatment: Gait belt Activity Tolerance: Patient limited by pain Patient left: in bed;with call bell/phone within reach;with bed alarm set Nurse Communication: Mobility status PT Visit Diagnosis: Other abnormalities of gait and mobility (R26.89)     Time: 1610-9604 PT Time Calculation (min) (ACUTE ONLY): 18 min  Charges:    $Gait Training: 8-22 mins PT General Charges $$ ACUTE PT VISIT: 1 Visit          Paulino Door, DPT Physical Therapist Acute Rehabilitation Services Office: [phone]    Marcus Hayes 02/20/2023, 3:11 PM

## 2023-02-20 NOTE — Progress Notes (Signed)
Physical Therapy Treatment Patient Details Name: Marcus Hayes MRN: [id number] DOB: 05-23-1961 Today's Date: 02/20/2023   History of Present Illness 62 yo male s/p PERCUTANEOUS FIXATION OF THE L DISTAL FEMUR, DIAGNOSTIC ARTHOSCOPY OF CHONDROPLASTY on 02/17/23. 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  Pt has 5 steps to enter home without railings.  Pt educated on backwards technique with RW this session however requires mod assist +2 for safety and assist in order to perform.  Pt does not have this assist currently available and does not appear safe to d/c home today.    Assistance Recommended at Discharge Intermittent Supervision/Assistance  If plan is discharge home, recommend the following:  Can travel by private vehicle    A little help with walking and/or transfers;A little help with bathing/dressing/bathroom;Assistance with cooking/housework;Assist for transportation;Help with stairs or ramp for entrance   No  Equipment Recommendations  None recommended by PT    Recommendations for Other Services       Precautions / Restrictions Precautions Precautions: Fall Precaution Comments: pt has no ROM restrictions, OK to amb WBAT without KI Restrictions LLE Weight Bearing: Weight bearing as tolerated     Mobility  Bed Mobility Overal bed mobility: Needs Assistance Bed Mobility: Sit to Supine, Supine to Sit     Supine to sit: Supervision Sit to supine: Supervision   General bed mobility comments: pt able to self assist LLE on to bed  with gait belt    Transfers Overall transfer level: Needs assistance Equipment used: Rolling walker (2 wheels) Transfers: Sit to/from Stand Sit to Stand: Supervision           General transfer comment: cues for hand placement and safety    Ambulation/Gait Ambulation/Gait assistance: Min guard Gait Distance (Feet): 80 Feet Assistive device: Rolling walker (2 wheels) Gait  Pattern/deviations: Step-to pattern, Decreased weight shift to left Gait velocity: decr     General Gait Details: cues for sequence, pt would attempt to increase weight bearing on LLE however reports too painful;  heavily reliant on UEs through RW to ambulate   Stairs Stairs: Yes Stairs assistance: Mod assist, +2 safety/equipment, +2 physical assistance Stair Management: Step to pattern, Backwards, With walker Number of Stairs: 2 General stair comments: cues for sequence and safety, pt requiring significant assist to to weight shift to next step, heavily reliant on RW as well and requiring person to hold RW down; pt does not prefer this technique, does not feel safe performing and also does not have assist needed at home to perform safely to d/c today   Wheelchair Mobility     Tilt Bed    Modified Rankin (Stroke Patients Only)       Balance                                            Cognition Arousal/Alertness: Awake/alert Behavior During Therapy: WFL for tasks assessed/performed Overall Cognitive Status: Within Functional Limits for tasks assessed                                          Exercises      General Comments        Pertinent Vitals/Pain Pain Assessment Pain Assessment: 0-10  Pain Score: 10-Worst pain ever Pain Location: L knee/leg Pain Descriptors / Indicators: Aching, Grimacing, Sore, Guarding Pain Intervention(s): Monitored during session, Premedicated before session, Repositioned    Home Living                          Prior Function            PT Goals (current goals can now be found in the care plan section) Progress towards PT goals: Progressing toward goals    Frequency    Min 6X/week      PT Plan Current plan remains appropriate    Co-evaluation              AM-PAC PT "6 Clicks" Mobility   Outcome Measure  Help needed turning from your back to your side while in a flat bed  without using bedrails?: A Little Help needed moving from lying on your back to sitting on the side of a flat bed without using bedrails?: A Little Help needed moving to and from a bed to a chair (including a wheelchair)?: A Little Help needed standing up from a chair using your arms (e.g., wheelchair or bedside chair)?: A Little Help needed to walk in hospital room?: A Little Help needed climbing 3-5 steps with a railing? : A Lot 6 Click Score: 17    End of Session Equipment Utilized During Treatment: Gait belt Activity Tolerance: Patient limited by pain Patient left: in bed;with call bell/phone within reach;with family/visitor present Nurse Communication: Mobility status PT Visit Diagnosis: Other abnormalities of gait and mobility (R26.89)     Time: 0981-1914 PT Time Calculation (min) (ACUTE ONLY): 13 min  Charges:    $Gait Training: 8-22 mins PT General Charges $$ ACUTE PT VISIT: 1 Visit                     Paulino Door, DPT Physical Therapist Acute Rehabilitation Services Office: [phone]    Janan Halter Payson 02/20/2023, 3:17 PM

## 2023-02-20 NOTE — TOC Transition Note (Signed)
Transition of Care [facility]) - CM/SW Discharge Note  Patient Details  Name: Marcus Hayes MRN: [id number] Date of Birth: 24-Aug-1960  Transition of Care [facility]) CM/SW Contact:  Ewing Schlein, LCSW Phone Number: 02/20/2023, 10:27 AM  Clinical Narrative: Patient to be discharged home today with HHPT through [facility]. CSW updated Amy with [facility] regarding discharge.  Final next level of care: Home w Home Health Services Barriers to Discharge: Barriers Resolved  Discharge Plan and Services Additional resources added to the After Visit Summary for   In-house Referral: Clinical Social Work        DME Arranged: N/A DME Agency: NA HH Arranged: PT HH Agency: [facility] Date HH Agency Contacted: 02/19/23 Representative spoke with at [facility] Agency: Amy  Social Determinants of Health (SDOH) Interventions SDOH Screenings   Food Insecurity: No Food Insecurity (02/18/2023)  Housing: Low Risk  (02/18/2023)  Transportation Needs: No Transportation Needs (02/18/2023)  Utilities: Not At Risk (02/18/2023)  Depression (PHQ2-9): Low Risk  (11/12/2022)  Tobacco Use: High Risk (02/17/2023)   Readmission Risk Interventions     No data to display

## 2023-02-21 MED ORDER — HYDROMORPHONE HCL 1 MG/ML IJ SOLN
0.5000 mg | Freq: Three times a day (TID) | INTRAMUSCULAR | Status: DC | PRN
  Administered 2023-02-21 – 2023-02-22 (×5): 1 mg via INTRAVENOUS
  Filled 2023-02-21 (×5): qty 1

## 2023-02-21 MED ORDER — HYDROMORPHONE HCL 1 MG/ML IJ SOLN: 0.5000 mg | Freq: Three times a day (TID) | INTRAMUSCULAR | Status: DC | PRN

## 2023-02-21 NOTE — Plan of Care (Signed)
Pain reported at 6/10 and only requested benadryl to help him go back to sleep at 4AM.

## 2023-02-21 NOTE — Progress Notes (Signed)
   ORTHOPAEDIC PROGRESS NOTE  s/p Procedure(s): PERCUTANEOUS FIXATION OF THE DISTAL FEMUR, DIAGNOSTIC ARTHOSCOPY OF CHONDROPLASTY on 02/17/23 with Dr. Eulah Pont  SUBJECTIVE: Reports mod/severe pain about operative site at rest and with ambulation. Improved weight bearing with PT yesterday, walked 80 and 100 feet, but unable to pass stair training. His wife recently broke her ankle and she was supposed to help him recover, but now is unable to do so.   No chest pain. No SOB. No nausea/vomiting. No other complaints.  OBJECTIVE: PE: General: sitting up in hospital bed, NAD Cardiac: regular rate Pulmonary: no increased work of breathing LLE: dressing CDI, intact EHL/TA/GSC, compartments soft and compressible, 2+ DP pulse, sensation intact   Vitals:   02/20/23 2317 02/21/23 0606  BP: 139/75 (!) 134/94  Pulse: 69 71  Resp: 16 15  Temp: 97.7 F (36.5 C) 97.6 F (36.4 C)  SpO2: 95% 97%     ASSESSMENT: Marcus Hayes is a 62 y.o. male POD#f3  PLAN: Weightbearing: WBAT LLE, up with therapy. No ROM restrictions at left knee Insicional and dressing care: Reinforce dressings as needed Orthopedic device(s): None Showering: Post-op day #3 VTE prophylaxis: Lovenox while in patient. Will transition to aspirin outpatient. Pain control: PRN pain medication, minimize narcotics as able.  - Adjusted pain regimen:  - Tylenol 1000 mg every 8 hours - Celebrex 200 mg twice a day - Gabapentin 200 mg three times a day - Robaxin 500 mg every 8 hours as needed for muscle spasms - Oxycodone 5 -15 mg every 4 hours as needed - Dilaudid 0.5 - 1 mg every 8 hours as needed for severe breakthrough pain Follow - up plan: 2 weeks in office with Dr. Eulah Pont Dispo: Patient does not qualify for SNF through insurance. He does have daughter and daughter in law who can help him at home. Plan to work with PT on stair training over the weekend with hopeful discharge home Sunday or Monday.   Contact information:   Weekdays 8-5 Dr. Renaye Rakers, Janine Ores PA-C, After hours and holidays please check [email or web address] for group call information for [facility]   Janine Ores, Cordelia Poche 02/20/2023

## 2023-02-21 NOTE — Progress Notes (Signed)
Physical Therapy Treatment Patient Details Name: Marcus Hayes MRN: [id number] DOB: 24-May-1961 Today's Date: 02/21/2023   History of Present Illness 62 yo male s/p PERCUTANEOUS FIXATION OF THE L DISTAL FEMUR, DIAGNOSTIC ARTHOSCOPY OF CHONDROPLASTY on 02/17/23. PMH: ACDF C5-6, L knee surgery    PT Comments  Pt progressing well this session, incr amb distance (160') and able to tol incr wt on LLE; reviewed stairs with crutches and pt was able to ascend/descend with min assist. Continue PT in acute setting    Assistance Recommended at Discharge Intermittent Supervision/Assistance  If plan is discharge home, recommend the following:  Can travel by private vehicle    A little help with walking and/or transfers;A little help with bathing/dressing/bathroom;Assistance with cooking/housework;Assist for transportation;Help with stairs or ramp for entrance   No  Equipment Recommendations  None recommended by PT    Recommendations for Other Services       Precautions / Restrictions Precautions Precautions: Fall Precaution Comments: pt has no ROM restrictions, OK to amb WBAT without KI Restrictions Weight Bearing Restrictions: No LLE Weight Bearing: Weight bearing as tolerated     Mobility  Bed Mobility               General bed mobility comments:  (in recliner)    Transfers Overall transfer level: Needs assistance Equipment used: Rolling walker (2 wheels) Transfers: Sit to/from Stand Sit to Stand: Supervision           General transfer comment: cues for hand placement and safety, demonstrates carryover from previous sessions; repeated STS x4    Ambulation/Gait Ambulation/Gait assistance: Min guard, Supervision Gait Distance (Feet): 160 Feet Assistive device: Rolling walker (2 wheels) Gait Pattern/deviations: Step-to pattern, Decreased weight shift to left Gait velocity: decr     General Gait Details: cues for sequence, pt would attempt to increase weight  bearing on LLE however reports too painful;  decr reliance on UEs this session   Stairs   Stairs assistance: Min assist Stair Management: Step to pattern, With crutches, Forwards Number of Stairs: 3 General stair comments: cues for sequence and safety; unsteady requiring min assist for descent d/t posterior LOB   Wheelchair Mobility     Tilt Bed    Modified Rankin (Stroke Patients Only)       Balance   Sitting-balance support: No upper extremity supported, Feet supported Sitting balance-Leahy Scale: Good     Standing balance support: During functional activity, Reliant on assistive device for balance Standing balance-Leahy Scale: Fair Standing balance comment: with close supervision pt is able to static stand without UE support;able to stand and use vending machine with supervision for safety, no UE support                            Cognition Arousal/Alertness: Awake/alert Behavior During Therapy: WFL for tasks assessed/performed Overall Cognitive Status: Within Functional Limits for tasks assessed                                          Exercises      General Comments        Pertinent Vitals/Pain Pain Assessment Pain Assessment: 0-10 Pain Score: 5  Pain Location: L knee/leg Pain Descriptors / Indicators: Aching, Grimacing, Sore, Guarding Pain Intervention(s): Limited activity within patient's tolerance, Monitored during session, Premedicated before session    Home Living  Prior Function            PT Goals (current goals can now be found in the care plan section) Acute Rehab PT Goals PT Goal Formulation: With patient Time For Goal Achievement: 03/04/23 Potential to Achieve Goals: Good Progress towards PT goals: Progressing toward goals    Frequency    Min 6X/week      PT Plan Current plan remains appropriate    Co-evaluation              AM-PAC PT "6 Clicks" Mobility    Outcome Measure  Help needed turning from your back to your side while in a flat bed without using bedrails?: A Little Help needed moving from lying on your back to sitting on the side of a flat bed without using bedrails?: None Help needed moving to and from a bed to a chair (including a wheelchair)?: A Little Help needed standing up from a chair using your arms (e.g., wheelchair or bedside chair)?: A Little Help needed to walk in hospital room?: A Little Help needed climbing 3-5 steps with a railing? : A Little 6 Click Score: 19    End of Session Equipment Utilized During Treatment: Gait belt Activity Tolerance: Patient tolerated treatment well Patient left: in chair;with call bell/phone within reach Nurse Communication: Mobility status PT Visit Diagnosis: Other abnormalities of gait and mobility (R26.89)     Time: 8315-1761 PT Time Calculation (min) (ACUTE ONLY): 51 min  Charges:    $Gait Training: 23-37 mins PT General Charges $$ ACUTE PT VISIT: 1 Visit                     Ronald Vinsant, PT  Acute Rehab Dept [facility]) [phone]  02/21/2023    [facility] 02/21/2023, 11:44 AM

## 2023-02-22 NOTE — Progress Notes (Signed)
   ORTHOPAEDIC PROGRESS NOTE  s/p Procedure(s): PERCUTANEOUS FIXATION OF THE DISTAL FEMUR, DIAGNOSTIC ARTHOSCOPY OF CHONDROPLASTY on 02/17/23 with Dr. Eulah Hayes  SUBJECTIVE: Reports mod/severe pain about operative site at rest and with ambulation. Improved weight bearing with PT and able to do stair training with crutches yesterday.    His wife recently broke her ankle and she was supposed to help him recover, but now is unable to do so.   No chest pain. No SOB. No nausea/vomiting. No other complaints.  OBJECTIVE: PE: General: sitting up in hospital bed, NAD Cardiac: regular rate Pulmonary: no increased work of breathing LLE: dressing CDI, removed and left open to air today, intact EHL/TA/GSC, compartments soft and compressible, 2+ DP pulse, sensation intact   Vitals:   02/21/23 2109 02/22/23 0541  BP: 129/81 (!) 117/53  Pulse: 74 67  Resp: 18 19  Temp: 98.3 F (36.8 C) 97.7 F (36.5 C)  SpO2: 95% 97%     ASSESSMENT: Marcus Hayes is a 62 y.o. male POD#f3  PLAN: Weightbearing: WBAT LLE, up with therapy. No ROM restrictions at left knee Insicional and dressing care: Reinforce dressings as needed Orthopedic device(s): None Showering: Post-op day #3 VTE prophylaxis: Lovenox while in patient. Will transition to aspirin outpatient. Pain control: PRN pain medication, minimize narcotics as able.  - Adjusted pain regimen:  - Tylenol 1000 mg every 8 hours - Celebrex 200 mg twice a day - Gabapentin 200 mg three times a day - Robaxin 500 mg every 8 hours as needed for muscle spasms - Oxycodone 5 -15 mg every 4 hours as needed - Dilaudid 0.5 - 1 mg every 8 hours as needed for severe breakthrough pain Follow - up plan: 2 weeks in office with Dr. Eulah Hayes Dispo: Patient does not qualify for SNF through insurance. He does have daughter and daughter in law who can help him at home.   Patient has succeeded with increasing walking and weight bearing and has been able to perform  stairs on own. Plan to discharge home tomorrow.    Contact information:  Weekdays 8-5 Dr. Renaye Hayes, Janine Ores PA-C, After hours and holidays please check [email or web address] for group call information for [facility]   Janine Ores, Marcus Hayes 02/20/2023

## 2023-02-22 NOTE — Progress Notes (Signed)
Orthopedic Tech Progress Note Patient Details:  Marcus Hayes [birth year] [id number]  Dropped off crutches per PT's order. Ortho Devices Type of Ortho Device: Crutches Ortho Device/Splint Interventions: Ordered, Adjustment   Post Interventions Patient Tolerated: Well Instructions Provided: Adjustment of device, Care of device  Sherilyn Banker 02/22/2023, 7:15 PM

## 2023-02-22 NOTE — Progress Notes (Signed)
Physical Therapy Treatment Patient Details Name: Marcus Hayes MRN: [id number] DOB: [birth year] Today's Date: 02/22/2023   History of Present Illness 62 yo male s/p PERCUTANEOUS FIXATION OF THE L DISTAL FEMUR, DIAGNOSTIC ARTHOSCOPY OF CHONDROPLASTY on 02/17/23. PMH: ACDF C5-6, L knee surgery    PT Comments  Pt is progressing very well this session; pain control improving and pt is able to incr wt shift to LLE during gait. Pt will need crutches for home, contacted ortho tech. May need to address additional stair training prior to d/c      Assistance Recommended at Discharge Intermittent Supervision/Assistance  If plan is discharge home, recommend the following:  Can travel by private vehicle    A little help with walking and/or transfers;A little help with bathing/dressing/bathroom;Assistance with cooking/housework;Assist for transportation;Help with stairs or ramp for entrance   No  Equipment Recommendations  None recommended by PT    Recommendations for Other Services       Precautions / Restrictions Precautions Precautions: Fall Precaution Comments: pt has no ROM restrictions, OK to amb WBAT without KI Restrictions Weight Bearing Restrictions: No LLE Weight Bearing: Weight bearing as tolerated     Mobility  Bed Mobility Overal bed mobility: Modified Independent             General bed mobility comments: pt able to self assist LLE off bed  with gait belt, HOB elevated (in recliner)    Transfers Overall transfer level: Needs assistance Equipment used: Rolling walker (2 wheels) Transfers: Sit to/from Stand Sit to Stand: Supervision           General transfer comment: cues for hand placement and safety, demonstrates carryover from previous sessions; repeated STS x4    Ambulation/Gait Ambulation/Gait assistance: Min guard, Supervision Gait Distance (Feet): 130 Feet Assistive device: Rolling walker (2 wheels) Gait Pattern/deviations: Step-to pattern,  Decreased weight shift to left Gait velocity: decr     General Gait Details: cues for sequence, improved wt shift to LLE and decr reliance on RW. steady gait with RW, no LOB   Stairs             Wheelchair Mobility     Tilt Bed    Modified Rankin (Stroke Patients Only)       Balance   Sitting-balance support: No upper extremity supported, Feet supported Sitting balance-Leahy Scale: Good     Standing balance support: During functional activity, Reliant on assistive device for balance Standing balance-Leahy Scale: Fair Standing balance comment: with close supervision pt is able to static stand without UE support;able to stand and use vending machine with supervision for safety, no UE support                            Cognition Arousal/Alertness: Awake/alert Behavior During Therapy: WFL for tasks assessed/performed Overall Cognitive Status: Within Functional Limits for tasks assessed                                          Exercises General Exercises - Lower Extremity Ankle Circles/Pumps: AROM, Left, 5 reps Quad Sets:  (pt doign quad sets and lknee flexion on his own, using gait belt for heel slides)    General Comments        Pertinent Vitals/Pain Pain Assessment Pain Assessment: 0-10 Pain Score: 4  Pain Location: L knee/leg Pain Descriptors / Indicators:  Aching, Grimacing, Sore, Guarding    Home Living                          Prior Function            PT Goals (current goals can now be found in the care plan section) Acute Rehab PT Goals PT Goal Formulation: With patient Time For Goal Achievement: 03/04/23 Potential to Achieve Goals: Good Progress towards PT goals: Progressing toward goals    Frequency    Min 6X/week      PT Plan Current plan remains appropriate    Co-evaluation              AM-PAC PT "6 Clicks" Mobility   Outcome Measure  Help needed turning from your back to your  side while in a flat bed without using bedrails?: None Help needed moving from lying on your back to sitting on the side of a flat bed without using bedrails?: None Help needed moving to and from a bed to a chair (including a wheelchair)?: A Little Help needed standing up from a chair using your arms (e.g., wheelchair or bedside chair)?: A Little Help needed to walk in hospital room?: A Little Help needed climbing 3-5 steps with a railing? : A Little 6 Click Score: 20    End of Session Equipment Utilized During Treatment: Gait belt Activity Tolerance: Patient tolerated treatment well Patient left: in chair;with call bell/phone within reach;with nursing/sitter in room;with chair alarm set Nurse Communication: Mobility status PT Visit Diagnosis: Other abnormalities of gait and mobility (R26.89)     Time: 1110-1135 PT Time Calculation (min) (ACUTE ONLY): 25 min  Charges:    $Gait Training: 23-37 mins PT General Charges $$ ACUTE PT VISIT: 1 Visit                     Rosabell Geyer, PT  Acute Rehab Dept [facility]) [phone]  02/22/2023    [facility] 02/22/2023, 11:44 AM

## 2023-02-22 NOTE — Progress Notes (Signed)
Physical Therapy Treatment Patient Details Name: Marcus Hayes MRN: [id number] DOB: 1961-05-23 Today's Date: 02/22/2023   History of Present Illness 62 yo male s/p PERCUTANEOUS FIXATION OF THE L DISTAL FEMUR, DIAGNOSTIC ARTHOSCOPY OF CHONDROPLASTY on 02/17/23. PMH: ACDF C5-6, L knee surgery    PT Comments  Pt progressing well this session; reviewed stair training again this pm, pt with improved stability on stair descent. Continue in acute setting    Assistance Recommended at Discharge Intermittent Supervision/Assistance  If plan is discharge home, recommend the following:  Can travel by private vehicle    A little help with walking and/or transfers;A little help with bathing/dressing/bathroom;Assistance with cooking/housework;Assist for transportation;Help with stairs or ramp for entrance   No  Equipment Recommendations  None recommended by PT    Recommendations for Other Services       Precautions / Restrictions Precautions Precautions: Fall Precaution Comments: pt has no ROM restrictions, OK to amb WBAT without KI Restrictions Weight Bearing Restrictions: No LLE Weight Bearing: Weight bearing as tolerated     Mobility  Bed Mobility Overal bed mobility: Modified Independent             General bed mobility comments: pt able to self assist LLE off bed  with gait belt, HOB elevated (in recliner)    Transfers Overall transfer level: Needs assistance Equipment used: Rolling walker (2 wheels) Transfers: Sit to/from Stand Sit to Stand: Supervision           General transfer comment: cues for hand placement and safety, demonstrates carryover from previous sessions; repeated STS x4    Ambulation/Gait Ambulation/Gait assistance: Supervision Gait Distance (Feet): 180 Feet Assistive device: Rolling walker (2 wheels) Gait Pattern/deviations: Step-to pattern, Decreased weight shift to left Gait velocity: decr     General Gait Details: cues for sequence,  improved wt shift to LLE and decr reliance on RW. steady gait with RW, no LOB   Stairs Stairs: Yes Stairs assistance: Min guard, Min assist Stair Management: Step to pattern, With crutches, Forwards Number of Stairs: 3 General stair comments: cues for sequence and safety; slight LOB on stair descent however with  cues for incr BOS pt able to maintain balance with min/guard   Wheelchair Mobility     Tilt Bed    Modified Rankin (Stroke Patients Only)       Balance   Sitting-balance support: No upper extremity supported, Feet supported Sitting balance-Leahy Scale: Good     Standing balance support: During functional activity, Reliant on assistive device for balance Standing balance-Leahy Scale: Fair Standing balance comment: with close supervision pt is able to static stand without UE support;                            Cognition Arousal/Alertness: Awake/alert Behavior During Therapy: WFL for tasks assessed/performed Overall Cognitive Status: Within Functional Limits for tasks assessed                                          Exercises General Exercises - Lower Extremity Ankle Circles/Pumps: AROM, Left, 5 reps Quad Sets:  (pt doing quad sets and lknee flexion on his own, using gait belt for heel slides)    General Comments        Pertinent Vitals/Pain Pain Assessment Pain Assessment: 0-10 Pain Score: 4  Pain Location: L knee/leg Pain Descriptors /  Indicators: Aching, Grimacing, Sore, Guarding    Home Living                          Prior Function            PT Goals (current goals can now be found in the care plan section) Acute Rehab PT Goals PT Goal Formulation: With patient Time For Goal Achievement: 03/04/23 Potential to Achieve Goals: Good Progress towards PT goals: Progressing toward goals    Frequency    Min 6X/week      PT Plan Current plan remains appropriate    Co-evaluation               AM-PAC PT "6 Clicks" Mobility   Outcome Measure  Help needed turning from your back to your side while in a flat bed without using bedrails?: None Help needed moving from lying on your back to sitting on the side of a flat bed without using bedrails?: None Help needed moving to and from a bed to a chair (including a wheelchair)?: A Little Help needed standing up from a chair using your arms (e.g., wheelchair or bedside chair)?: A Little Help needed to walk in hospital room?: A Little Help needed climbing 3-5 steps with a railing? : A Little 6 Click Score: 20    End of Session Equipment Utilized During Treatment: Gait belt Activity Tolerance: Patient tolerated treatment well Patient left: in chair;with call bell/phone within reach;with nursing/sitter in room;with chair alarm set Nurse Communication: Mobility status PT Visit Diagnosis: Other abnormalities of gait and mobility (R26.89)     Time: 6578-4696 PT Time Calculation (min) (ACUTE ONLY): 25 min  Charges:    $Gait Training: 23-37 mins PT General Charges $$ ACUTE PT VISIT: 1 Visit                     Gabrielle Wakeland, PT  Acute Rehab Dept [facility]) [phone]  02/22/2023    [facility] 02/22/2023, 4:30 PM

## 2023-02-23 ENCOUNTER — Other Ambulatory Visit (HOSPITAL_COMMUNITY): Payer: Self-pay

## 2023-02-23 NOTE — Discharge Summary (Signed)
Physician Discharge Summary  Patient ID: Marcus Hayes MRN: [id number] DOB/AGE: 1961-03-15 62 y.o.  Admit date: 02/17/2023 Discharge date: 02/23/2023  Admission Diagnoses:    Insufficiency fracture of femur, left, initial encounter [facility])  Discharge Diagnoses:  Principal Problem:   Insufficiency fracture of femur, left, initial encounter [facility])   Discharged Condition: fair  Hospital Course: Patient under went a left distal femur subchondroplasty with Dr. Eulah Pont on 02/17/23 without complications. He spent several nights in the hospital due to uncontrolled pain and difficulty with mobilization. He is now feeling better and ready for discharge home with HHPT.   Consults: None  Significant Diagnostic Studies: n/a  Treatments: IV hydration, antibiotics: Ancef, analgesia: acetaminophen, Dilaudid, and Oxycodone, anticoagulation: Lovenox, therapies: PT and SW, and surgery: left distal femur subchondroplasty  Discharge Exam: Blood pressure 127/82, pulse 79, temperature 97.6 F (36.4 C), temperature source Oral, resp. rate 17, height 6' (1.829 m), weight 107.5 kg, SpO2 95%. General appearance: cooperative and no distress Head: Normocephalic, without obvious abnormality, atraumatic Resp: no accessory muscle use Cardio: regular rate and rhythm Extremities: extremities normal, atraumatic, no cyanosis or edema Pulses:  L brachial 2+ R brachial 2+  L radial 2+ R radial 2+  L inguinal 2+ R inguinal 2+  L popliteal 2+ R popliteal 2+  L posterior tibial 2+ R posterior tibial 2+  L dorsalis pedis 2+ R dorsalis pedis 2+   Neurologic: Grossly normal Incision/Wound: c/d/i  Disposition: Discharge disposition: 06-Home-Health Care Svc        Allergies as of 02/23/2023   No Known Allergies      Medication List     STOP taking these medications    HYDROcodone-acetaminophen 10-325 MG tablet Commonly known as: NORCO   lidocaine 4 % Commonly known as: Lidocaine Pain Relief    lidocaine 5 % Commonly known as: LIDODERM   Lidocaine 5 % Crea       TAKE these medications    acetaminophen 500 MG tablet Commonly known as: TYLENOL Take 2 tablets by mouth every 6 hours.   allopurinol 300 MG tablet Commonly known as: ZYLOPRIM TAKE 1 TABLET BY MOUTH ONCE A DAY FOR GOUT PREVENTION   aspirin EC 81 MG tablet Take 1 tablet (81 mg) by mouth 2 times daily. For DVT prophylaxis for 30 days after surgery.   atorvastatin 20 MG tablet Commonly known as: LIPITOR TAKE 1 TABLET BY MOUTH EVERY EVENING   Calcium 500 + D3 500-15 MG-MCG Tabs Generic drug: Calcium Carb-Cholecalciferol Take 1 tablet by mouth twice daily   celecoxib 200 MG capsule Commonly known as: CELEBREX Take 1 capsule (200 mg) by mouth 2 times daily.   D-5000 125 MCG (5000 UT) Tabs Generic drug: Cholecalciferol Take 1 tablet by mouth once a day   methocarbamol 750 MG tablet Commonly known as: ROBAXIN Take 1 tablet (750 mg) by mouth every 6 hours as needed for muscle spasms.   ondansetron 4 MG tablet Commonly known as: ZOFRAN Take 1 tablet (4 mg) by mouth every 6 hours as needed for nausea.   Oxycodone HCl 10 MG Tabs Take 1 - 1 and 1/2 tablets (10 - 15 mg total) by mouth every 4 hours as needed for severe pain (pain score 7 - 10).   pantoprazole 40 MG tablet Commonly known as: PROTONIX Take 1 tablet (40 mg total) by mouth daily.        Follow-up Information     [facility] Follow up.   Why: Iantha Fallen will follow  you at rehab and start PT in the home after discharge. Contact information: [address] [place] [address] [phone]         Sheral Apley, MD. Schedule an appointment as soon as possible for a visit.   Specialty: Orthopedic Surgery Contact information: [address] [place] [address] [phone]                 Signed: Marzetta Board 02/23/2023, 1:26 PM

## 2023-02-23 NOTE — Progress Notes (Signed)
Pt's PIV removed as documented. Pt to shower prior to d/c to home w/ nurse tech assistance. Pt to be cleared by PT prior to d/c to home. Family member available to pick up patient from hospital after lunch. Pt will be at home w/ his wife & will have family assistance at home.

## 2023-02-23 NOTE — Progress Notes (Signed)
Discharge package printed and reviewed with pt. Patient verbalizes understanding.

## 2023-02-23 NOTE — Plan of Care (Signed)
?  Problem: Activity: ?Goal: Risk for activity intolerance will decrease ?Outcome: Progressing ?  ?Problem: Safety: ?Goal: Ability to remain free from injury will improve ?Outcome: Progressing ?  ?Problem: Pain Managment: ?Goal: General experience of comfort will improve ?Outcome: Progressing ?  ?

## 2023-02-23 NOTE — Plan of Care (Signed)
  Problem: Pain Managment: Goal: General experience of comfort will improve Outcome: Progressing   

## 2023-02-23 NOTE — Plan of Care (Signed)
  Problem: Pain Managment: Goal: General experience of comfort will improve 02/23/2023 0556 by Kizzie Bane, RN Outcome: Progressing 02/23/2023 0533 by Kizzie Bane, RN Outcome: Progressing

## 2023-02-23 NOTE — Progress Notes (Signed)
Physical Therapy Treatment Patient Details Name: Marcus Hayes MRN: [id number] DOB: 04/09/1961 Today's Date: 02/23/2023   History of Present Illness 62 yo male s/p PERCUTANEOUS FIXATION OF THE L DISTAL FEMUR, DIAGNOSTIC ARTHOSCOPY OF CHONDROPLASTY on 02/17/23. 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  Encouraged continued work on L knee ROM as tolerated. Pt feels ready for d/c home; he reports wife left SNF and is at home now;      Assistance Recommended at Discharge Intermittent Supervision/Assistance  If plan is discharge home, recommend the following:  Can travel by private vehicle    A little help with walking and/or transfers;A little help with bathing/dressing/bathroom;Assistance with cooking/housework;Assist for transportation;Help with stairs or ramp for entrance   No  Equipment Recommendations  None recommended by PT    Recommendations for Other Services       Precautions / Restrictions Precautions Precautions: Fall Precaution Comments: pt has no ROM restrictions, OK to amb WBAT without KI Restrictions Weight Bearing Restrictions: No LLE Weight Bearing: Weight bearing as tolerated     Mobility  Bed Mobility                    Transfers Overall transfer level: Needs assistance Equipment used: Rolling walker (2 wheels) Transfers: Sit to/from Stand Sit to Stand: Modified independent (Device/Increase time)                Ambulation/Gait Ambulation/Gait assistance: Modified independent (Device/Increase time) Gait Distance (Feet): 160 Feet Assistive device: Rolling walker (2 wheels) Gait Pattern/deviations: Step-to pattern, Decreased weight shift to left Gait velocity: decr     General Gait Details: good progress, improved wt  shift to LLE. steady gait with RW support, no LOB   Stairs             Wheelchair Mobility     Tilt Bed    Modified Rankin (Stroke Patients Only)       Balance   Sitting-balance support: No upper extremity supported, Feet supported Sitting balance-Leahy Scale: Good     Standing balance support: During functional activity, Reliant on assistive device for balance Standing balance-Leahy Scale: Fair Standing balance comment: with close supervision pt is able to static stand without UE support;                            Cognition Arousal/Alertness: Awake/alert Behavior During Therapy: WFL for tasks assessed/performed Overall Cognitive Status: Within Functional Limits for tasks assessed                                          Exercises      General Comments        Pertinent Vitals/Pain Pain Assessment Pain Assessment: 0-10 Pain Score: 2  Pain Location: L knee/leg Pain Descriptors / Indicators: Sore, Discomfort Pain Intervention(s): Limited activity within patient's tolerance, Monitored during session    Home Living                          Prior Function            PT Goals (current  goals can now be found in the care plan section) Acute Rehab PT Goals PT Goal Formulation: With patient Time For Goal Achievement: 03/04/23 Potential to Achieve Goals: Good Progress towards PT goals: Progressing toward goals    Frequency    Min 6X/week      PT Plan Current plan remains appropriate    Co-evaluation              AM-PAC PT "6 Clicks" Mobility   Outcome Measure  Help needed turning from your back to your side while in a flat bed without using bedrails?: None Help needed moving from lying on your back to sitting on the side of a flat bed without using bedrails?: None Help needed moving to and from a bed to a chair (including a wheelchair)?: A Little Help needed standing up from a chair using your arms  (e.g., wheelchair or bedside chair)?: A Little Help needed to walk in hospital room?: A Little Help needed climbing 3-5 steps with a railing? : A Little 6 Click Score: 20    End of Session Equipment Utilized During Treatment: Gait belt Activity Tolerance: Patient tolerated treatment well Patient left: in chair;with call bell/phone within reach Nurse Communication: Mobility status PT Visit Diagnosis: Other abnormalities of gait and mobility (R26.89)     Time: 1610-9604 PT Time Calculation (min) (ACUTE ONLY): 17 min  Charges:    $Gait Training: 8-22 mins PT General Charges $$ ACUTE PT VISIT: 1 Visit                     Annaliese Saez, PT  Acute Rehab Dept [facility]) [phone]  02/23/2023    [facility] 02/23/2023, 12:21 PM

## 2023-02-24 ENCOUNTER — Telehealth: Payer: Self-pay

## 2023-02-24 DIAGNOSIS — M109 Gout, unspecified: Secondary | ICD-10-CM | POA: Diagnosis not present

## 2023-02-24 DIAGNOSIS — M84452D Pathological fracture, left femur, subsequent encounter for fracture with routine healing: Secondary | ICD-10-CM | POA: Diagnosis not present

## 2023-02-24 DIAGNOSIS — G8929 Other chronic pain: Secondary | ICD-10-CM | POA: Diagnosis not present

## 2023-02-24 DIAGNOSIS — M4722 Other spondylosis with radiculopathy, cervical region: Secondary | ICD-10-CM | POA: Diagnosis not present

## 2023-02-24 DIAGNOSIS — K219 Gastro-esophageal reflux disease without esophagitis: Secondary | ICD-10-CM | POA: Diagnosis not present

## 2023-02-24 DIAGNOSIS — Z7982 Long term (current) use of aspirin: Secondary | ICD-10-CM | POA: Diagnosis not present

## 2023-02-24 DIAGNOSIS — R12 Heartburn: Secondary | ICD-10-CM | POA: Diagnosis not present

## 2023-02-24 DIAGNOSIS — E785 Hyperlipidemia, unspecified: Secondary | ICD-10-CM | POA: Diagnosis not present

## 2023-02-24 DIAGNOSIS — M549 Dorsalgia, unspecified: Secondary | ICD-10-CM | POA: Diagnosis not present

## 2023-02-24 DIAGNOSIS — M1990 Unspecified osteoarthritis, unspecified site: Secondary | ICD-10-CM | POA: Diagnosis not present

## 2023-02-24 NOTE — Transitions of Care (Post Inpatient/ED Visit) (Signed)
   02/24/2023  Name: Marcus Hayes MRN: [id number] DOB: [birth year]  Today's TOC FU Call Status: Today's TOC FU Call Status:: Unsuccessful Call (2nd Attempt) Unsuccessful Call (2nd Attempt) Date: 02/24/23  Attempted to reach the patient regarding the most recent Inpatient/ED visit.  Follow Up Plan: Additional outreach attempts will be made to reach the patient to complete the Transitions of Care (Post Inpatient/ED visit) call.   Jodelle Gross, RN, BSN, CCM Care Management Coordinator [place] Phone: [phone]/Fax: [phone]

## 2023-02-24 NOTE — Transitions of Care (Post Inpatient/ED Visit) (Signed)
   02/24/2023  Name: Marcus Hayes MRN: [id number] DOB: [birth year]  Today's TOC FU Call Status: Today's TOC FU Call Status:: Unsuccessul Call (1st Attempt) Unsuccessful Call (1st Attempt) Date: 02/24/23  Attempted to reach the patient regarding the most recent Inpatient/ED visit.  Follow Up Plan: Additional outreach attempts will be made to reach the patient to complete the Transitions of Care (Post Inpatient/ED visit) call.   Jodelle Gross, RN, BSN, CCM Care Management Coordinator [place] Phone: [phone]/Fax: [phone]

## 2023-02-25 ENCOUNTER — Telehealth: Payer: Self-pay

## 2023-02-25 NOTE — Transitions of Care (Post Inpatient/ED Visit) (Signed)
02/25/2023  Name: Marcus Hayes MRN: [id number] DOB: [birth year]  Today's TOC FU Call Status: Today's TOC FU Call Status:: Successful TOC FU Call Competed TOC FU Call Complete Date: 02/25/23  Transition Care Management Follow-up Telephone Call Date of Discharge: 02/23/23 Discharge Facility: [name]) Type of Discharge: Inpatient Admission Primary Inpatient Discharge Diagnosis:: Insufficiency Fracture of Left Femur How have you been since you were released from the hospital?: Same (Patient rates pain level of 6.  He took pain medication at 11:30 and was icing leg.) Any questions or concerns?: Yes Patient Questions/Concerns:: Patient continues with pain after taking his oxycodone (took pain med at 11:30, no relief at 1:10pm) Patient Questions/Concerns Addressed: Notified Provider of Patient Questions/Concerns  Items Reviewed: Did you receive and understand the discharge instructions provided?: Yes Medications obtained,verified, and reconciled?: Partial Review Completed Reason for Partial Mediation Review: Discussed new medications upon discharge Any new allergies since your discharge?: No Dietary orders reviewed?: No Do you have support at home?: Yes People in Home: spouse Name of Support/Comfort Primary Source: Tammy  Medications Reviewed Today: Medications Reviewed Today     Reviewed by Jodelle Gross, RN (Case Manager) on 02/25/23 at 1310  Med List Status: <None>   Medication Order Taking? Sig Documenting Provider Last Dose Status Informant  acetaminophen (TYLENOL) 500 MG tablet [id number]  Take 2 tablets by mouth every 6 hours. Janine Ores K, PA-C  Active   allopurinol (ZYLOPRIM) 300 MG tablet [id number]  TAKE 1 TABLET BY MOUTH ONCE A DAY FOR GOUT PREVENTION Babs Sciara, MD  Active Self  aspirin EC 81 MG tablet [id number]  Take 1 tablet (81 mg) by mouth 2 times daily. For DVT prophylaxis for 30 days after surgery. Armida Sans, PA-C  Active   atorvastatin  (LIPITOR) 20 MG tablet [id number]  TAKE 1 TABLET BY MOUTH EVERY EVENING Luking, Jonna Coup, MD  Active Self  Calcium Carb-Cholecalciferol (CALCIUM 500 + D3) 500-15 MG-MCG TABS [id number]  Take 1 tablet by mouth twice daily  Patient not taking: Reported on 11/12/2022     Active Self  celecoxib (CELEBREX) 200 MG capsule [id number] Yes Take 1 capsule (200 mg) by mouth 2 times daily. Armida Sans, PA-C Taking Active   Cholecalciferol (D-5000) 125 MCG (5000 UT) TABS [id number]  Take 1 tablet by mouth once a day  Patient not taking: Reported on 11/12/2022     Active Self  methocarbamol (ROBAXIN) 750 MG tablet [id number] Yes Take 1 tablet (750 mg) by mouth every 6 hours as needed for muscle spasms. Armida Sans, PA-C Taking Active   ondansetron (ZOFRAN) 4 MG tablet [id number]  Take 1 tablet (4 mg) by mouth every 6 hours as needed for nausea. Armida Sans, PA-C  Active   Oxycodone HCl 10 MG TABS [id number] Yes Take 1 - 1 and 1/2 tablets (10 - 15 mg total) by mouth every 4 hours as needed for severe pain (pain score 7 - 10). Armida Sans, PA-C Taking Active   pantoprazole (PROTONIX) 40 MG tablet [id number]  Take 1 tablet (40 mg total) by mouth daily. Babs Sciara, MD  Active Self            Home Care and Equipment/Supplies: Were Home Health Services Ordered?: Yes Name of Home Health Agency:: [facility] Has Agency set up a time to come to your home?: Yes First Home Health Visit Date: 02/24/23 Any new equipment or medical supplies ordered?: No  Functional Questionnaire: Do you need assistance  with bathing/showering or dressing?: Yes Do you need assistance with meal preparation?: Yes Do you need assistance with eating?: No Do you have difficulty maintaining continence: No Do you need assistance with getting out of bed/getting out of a chair/moving?: Yes Do you have difficulty managing or taking your medications?: No  Follow up appointments reviewed: PCP Follow-up appointment confirmed?:  [facility] Follow-up appointment confirmed?: Yes Date of Specialist follow-up appointment?: 02/27/23 Follow-Up Specialty Provider:: Dr. Lennie Odor Do you need transportation to your follow-up appointment?: No Do you understand care options if your condition(s) worsen?: Yes-patient verbalized understanding   TOC Interventions Today    Flowsheet Row Most Recent Value  TOC Interventions   TOC Interventions Discussed/Reviewed TOC Interventions Discussed, TOC Interventions Reviewed, Contacted provider for patient needs       Jodelle Gross, RN, BSN, CCM Care Management Coordinator [place] Phone: [phone]/Fax: [phone]

## 2023-02-26 ENCOUNTER — Other Ambulatory Visit (HOSPITAL_COMMUNITY): Payer: Self-pay

## 2023-02-27 ENCOUNTER — Other Ambulatory Visit (HOSPITAL_COMMUNITY): Payer: Self-pay

## 2023-02-27 DIAGNOSIS — M109 Gout, unspecified: Secondary | ICD-10-CM | POA: Diagnosis not present

## 2023-02-27 DIAGNOSIS — M84452D Pathological fracture, left femur, subsequent encounter for fracture with routine healing: Secondary | ICD-10-CM | POA: Diagnosis not present

## 2023-02-27 DIAGNOSIS — M1990 Unspecified osteoarthritis, unspecified site: Secondary | ICD-10-CM | POA: Diagnosis not present

## 2023-02-27 DIAGNOSIS — Z7982 Long term (current) use of aspirin: Secondary | ICD-10-CM | POA: Diagnosis not present

## 2023-02-27 DIAGNOSIS — R12 Heartburn: Secondary | ICD-10-CM | POA: Diagnosis not present

## 2023-02-27 DIAGNOSIS — M549 Dorsalgia, unspecified: Secondary | ICD-10-CM | POA: Diagnosis not present

## 2023-02-27 DIAGNOSIS — M4722 Other spondylosis with radiculopathy, cervical region: Secondary | ICD-10-CM | POA: Diagnosis not present

## 2023-02-27 DIAGNOSIS — K219 Gastro-esophageal reflux disease without esophagitis: Secondary | ICD-10-CM | POA: Diagnosis not present

## 2023-02-27 DIAGNOSIS — E785 Hyperlipidemia, unspecified: Secondary | ICD-10-CM | POA: Diagnosis not present

## 2023-02-27 DIAGNOSIS — M2342 Loose body in knee, left knee: Secondary | ICD-10-CM | POA: Diagnosis not present

## 2023-02-27 DIAGNOSIS — G8929 Other chronic pain: Secondary | ICD-10-CM | POA: Diagnosis not present

## 2023-02-27 MED ORDER — PREDNISONE 10 MG PO TABS
ORAL_TABLET | ORAL | 0 refills | Status: AC
  Filled 2023-02-27: qty 48, 12d supply, fill #0

## 2023-02-27 MED ORDER — LIDOCAINE 4 % EX PTCH
1.0000 | MEDICATED_PATCH | Freq: Every day | CUTANEOUS | 2 refills | Status: AC | PRN
  Filled 2023-02-27: qty 30, 30d supply, fill #0

## 2023-02-27 MED ORDER — OXYCODONE HCL 5 MG PO TABS
5.0000 mg | ORAL_TABLET | ORAL | 0 refills | Status: DC
Start: 2023-02-27 — End: 2023-04-23
  Filled 2023-02-27: qty 30, 5d supply, fill #0

## 2023-03-02 DIAGNOSIS — M2342 Loose body in knee, left knee: Secondary | ICD-10-CM | POA: Diagnosis not present

## 2023-03-02 DIAGNOSIS — K219 Gastro-esophageal reflux disease without esophagitis: Secondary | ICD-10-CM | POA: Diagnosis not present

## 2023-03-02 DIAGNOSIS — M549 Dorsalgia, unspecified: Secondary | ICD-10-CM | POA: Diagnosis not present

## 2023-03-02 DIAGNOSIS — R12 Heartburn: Secondary | ICD-10-CM | POA: Diagnosis not present

## 2023-03-02 DIAGNOSIS — Z7982 Long term (current) use of aspirin: Secondary | ICD-10-CM | POA: Diagnosis not present

## 2023-03-02 DIAGNOSIS — E785 Hyperlipidemia, unspecified: Secondary | ICD-10-CM | POA: Diagnosis not present

## 2023-03-02 DIAGNOSIS — M4722 Other spondylosis with radiculopathy, cervical region: Secondary | ICD-10-CM | POA: Diagnosis not present

## 2023-03-02 DIAGNOSIS — M1990 Unspecified osteoarthritis, unspecified site: Secondary | ICD-10-CM | POA: Diagnosis not present

## 2023-03-02 DIAGNOSIS — M109 Gout, unspecified: Secondary | ICD-10-CM | POA: Diagnosis not present

## 2023-03-02 DIAGNOSIS — G8929 Other chronic pain: Secondary | ICD-10-CM | POA: Diagnosis not present

## 2023-03-02 DIAGNOSIS — M84452D Pathological fracture, left femur, subsequent encounter for fracture with routine healing: Secondary | ICD-10-CM | POA: Diagnosis not present

## 2023-03-05 ENCOUNTER — Other Ambulatory Visit: Payer: Self-pay

## 2023-03-05 ENCOUNTER — Other Ambulatory Visit (HOSPITAL_COMMUNITY): Payer: Self-pay

## 2023-03-05 DIAGNOSIS — Z7982 Long term (current) use of aspirin: Secondary | ICD-10-CM | POA: Diagnosis not present

## 2023-03-05 DIAGNOSIS — M1990 Unspecified osteoarthritis, unspecified site: Secondary | ICD-10-CM | POA: Diagnosis not present

## 2023-03-05 DIAGNOSIS — R12 Heartburn: Secondary | ICD-10-CM | POA: Diagnosis not present

## 2023-03-05 DIAGNOSIS — G8929 Other chronic pain: Secondary | ICD-10-CM | POA: Diagnosis not present

## 2023-03-05 DIAGNOSIS — M84452D Pathological fracture, left femur, subsequent encounter for fracture with routine healing: Secondary | ICD-10-CM | POA: Diagnosis not present

## 2023-03-05 DIAGNOSIS — M549 Dorsalgia, unspecified: Secondary | ICD-10-CM | POA: Diagnosis not present

## 2023-03-05 DIAGNOSIS — M4722 Other spondylosis with radiculopathy, cervical region: Secondary | ICD-10-CM | POA: Diagnosis not present

## 2023-03-05 DIAGNOSIS — E785 Hyperlipidemia, unspecified: Secondary | ICD-10-CM | POA: Diagnosis not present

## 2023-03-05 DIAGNOSIS — M109 Gout, unspecified: Secondary | ICD-10-CM | POA: Diagnosis not present

## 2023-03-05 DIAGNOSIS — K219 Gastro-esophageal reflux disease without esophagitis: Secondary | ICD-10-CM | POA: Diagnosis not present

## 2023-03-06 ENCOUNTER — Other Ambulatory Visit (HOSPITAL_COMMUNITY): Payer: Self-pay

## 2023-03-06 MED ORDER — OXYCODONE HCL 5 MG PO TABS
5.0000 mg | ORAL_TABLET | Freq: Four times a day (QID) | ORAL | 0 refills | Status: DC | PRN
Start: 2023-03-06 — End: 2023-04-23
  Filled 2023-03-06: qty 28, 7d supply, fill #0

## 2023-03-11 DIAGNOSIS — M4722 Other spondylosis with radiculopathy, cervical region: Secondary | ICD-10-CM | POA: Diagnosis not present

## 2023-03-11 DIAGNOSIS — E785 Hyperlipidemia, unspecified: Secondary | ICD-10-CM | POA: Diagnosis not present

## 2023-03-11 DIAGNOSIS — G8929 Other chronic pain: Secondary | ICD-10-CM | POA: Diagnosis not present

## 2023-03-11 DIAGNOSIS — M2342 Loose body in knee, left knee: Secondary | ICD-10-CM | POA: Diagnosis not present

## 2023-03-11 DIAGNOSIS — M549 Dorsalgia, unspecified: Secondary | ICD-10-CM | POA: Diagnosis not present

## 2023-03-11 DIAGNOSIS — K219 Gastro-esophageal reflux disease without esophagitis: Secondary | ICD-10-CM | POA: Diagnosis not present

## 2023-03-11 DIAGNOSIS — M109 Gout, unspecified: Secondary | ICD-10-CM | POA: Diagnosis not present

## 2023-03-11 DIAGNOSIS — M1990 Unspecified osteoarthritis, unspecified site: Secondary | ICD-10-CM | POA: Diagnosis not present

## 2023-03-11 DIAGNOSIS — R12 Heartburn: Secondary | ICD-10-CM | POA: Diagnosis not present

## 2023-03-11 DIAGNOSIS — M84452D Pathological fracture, left femur, subsequent encounter for fracture with routine healing: Secondary | ICD-10-CM | POA: Diagnosis not present

## 2023-03-11 DIAGNOSIS — Z7982 Long term (current) use of aspirin: Secondary | ICD-10-CM | POA: Diagnosis not present

## 2023-03-13 DIAGNOSIS — K219 Gastro-esophageal reflux disease without esophagitis: Secondary | ICD-10-CM | POA: Diagnosis not present

## 2023-03-13 DIAGNOSIS — Z7982 Long term (current) use of aspirin: Secondary | ICD-10-CM | POA: Diagnosis not present

## 2023-03-13 DIAGNOSIS — G8929 Other chronic pain: Secondary | ICD-10-CM | POA: Diagnosis not present

## 2023-03-13 DIAGNOSIS — M549 Dorsalgia, unspecified: Secondary | ICD-10-CM | POA: Diagnosis not present

## 2023-03-13 DIAGNOSIS — R12 Heartburn: Secondary | ICD-10-CM | POA: Diagnosis not present

## 2023-03-13 DIAGNOSIS — M84452D Pathological fracture, left femur, subsequent encounter for fracture with routine healing: Secondary | ICD-10-CM | POA: Diagnosis not present

## 2023-03-13 DIAGNOSIS — M1990 Unspecified osteoarthritis, unspecified site: Secondary | ICD-10-CM | POA: Diagnosis not present

## 2023-03-13 DIAGNOSIS — M4722 Other spondylosis with radiculopathy, cervical region: Secondary | ICD-10-CM | POA: Diagnosis not present

## 2023-03-13 DIAGNOSIS — M109 Gout, unspecified: Secondary | ICD-10-CM | POA: Diagnosis not present

## 2023-03-13 DIAGNOSIS — E785 Hyperlipidemia, unspecified: Secondary | ICD-10-CM | POA: Diagnosis not present

## 2023-03-16 DIAGNOSIS — E785 Hyperlipidemia, unspecified: Secondary | ICD-10-CM | POA: Diagnosis not present

## 2023-03-16 DIAGNOSIS — G8929 Other chronic pain: Secondary | ICD-10-CM | POA: Diagnosis not present

## 2023-03-16 DIAGNOSIS — R12 Heartburn: Secondary | ICD-10-CM | POA: Diagnosis not present

## 2023-03-16 DIAGNOSIS — M1990 Unspecified osteoarthritis, unspecified site: Secondary | ICD-10-CM | POA: Diagnosis not present

## 2023-03-16 DIAGNOSIS — M84452D Pathological fracture, left femur, subsequent encounter for fracture with routine healing: Secondary | ICD-10-CM | POA: Diagnosis not present

## 2023-03-16 DIAGNOSIS — M4722 Other spondylosis with radiculopathy, cervical region: Secondary | ICD-10-CM | POA: Diagnosis not present

## 2023-03-16 DIAGNOSIS — Z7982 Long term (current) use of aspirin: Secondary | ICD-10-CM | POA: Diagnosis not present

## 2023-03-16 DIAGNOSIS — M109 Gout, unspecified: Secondary | ICD-10-CM | POA: Diagnosis not present

## 2023-03-16 DIAGNOSIS — K219 Gastro-esophageal reflux disease without esophagitis: Secondary | ICD-10-CM | POA: Diagnosis not present

## 2023-03-16 DIAGNOSIS — M549 Dorsalgia, unspecified: Secondary | ICD-10-CM | POA: Diagnosis not present

## 2023-03-18 ENCOUNTER — Other Ambulatory Visit (HOSPITAL_COMMUNITY): Payer: Self-pay

## 2023-03-18 DIAGNOSIS — M549 Dorsalgia, unspecified: Secondary | ICD-10-CM | POA: Diagnosis not present

## 2023-03-18 DIAGNOSIS — Z7982 Long term (current) use of aspirin: Secondary | ICD-10-CM | POA: Diagnosis not present

## 2023-03-18 DIAGNOSIS — M1990 Unspecified osteoarthritis, unspecified site: Secondary | ICD-10-CM | POA: Diagnosis not present

## 2023-03-18 DIAGNOSIS — E785 Hyperlipidemia, unspecified: Secondary | ICD-10-CM | POA: Diagnosis not present

## 2023-03-18 DIAGNOSIS — R12 Heartburn: Secondary | ICD-10-CM | POA: Diagnosis not present

## 2023-03-18 DIAGNOSIS — M109 Gout, unspecified: Secondary | ICD-10-CM | POA: Diagnosis not present

## 2023-03-18 DIAGNOSIS — M4722 Other spondylosis with radiculopathy, cervical region: Secondary | ICD-10-CM | POA: Diagnosis not present

## 2023-03-18 DIAGNOSIS — K219 Gastro-esophageal reflux disease without esophagitis: Secondary | ICD-10-CM | POA: Diagnosis not present

## 2023-03-18 DIAGNOSIS — G8929 Other chronic pain: Secondary | ICD-10-CM | POA: Diagnosis not present

## 2023-03-18 DIAGNOSIS — M84452D Pathological fracture, left femur, subsequent encounter for fracture with routine healing: Secondary | ICD-10-CM | POA: Diagnosis not present

## 2023-03-19 ENCOUNTER — Encounter: Payer: Self-pay | Admitting: *Deleted

## 2023-03-20 ENCOUNTER — Other Ambulatory Visit (HOSPITAL_COMMUNITY): Payer: Self-pay

## 2023-03-20 MED ORDER — METHOCARBAMOL 750 MG PO TABS
750.0000 mg | ORAL_TABLET | Freq: Three times a day (TID) | ORAL | 0 refills | Status: DC | PRN
Start: 2023-03-20 — End: 2023-04-23
  Filled 2023-03-20: qty 21, 7d supply, fill #0

## 2023-03-20 MED ORDER — CELECOXIB 200 MG PO CAPS
200.0000 mg | ORAL_CAPSULE | Freq: Two times a day (BID) | ORAL | 0 refills | Status: DC | PRN
  Filled 2023-03-20: qty 60, 30d supply, fill #0

## 2023-03-20 MED ORDER — HYDROCODONE-ACETAMINOPHEN 5-325 MG PO TABS
1.0000 | ORAL_TABLET | Freq: Three times a day (TID) | ORAL | 0 refills | Status: DC | PRN
Start: 2023-03-20 — End: 2023-03-30
  Filled 2023-03-20: qty 21, 7d supply, fill #0

## 2023-03-23 DIAGNOSIS — M4722 Other spondylosis with radiculopathy, cervical region: Secondary | ICD-10-CM | POA: Diagnosis not present

## 2023-03-23 DIAGNOSIS — M549 Dorsalgia, unspecified: Secondary | ICD-10-CM | POA: Diagnosis not present

## 2023-03-23 DIAGNOSIS — K219 Gastro-esophageal reflux disease without esophagitis: Secondary | ICD-10-CM | POA: Diagnosis not present

## 2023-03-23 DIAGNOSIS — G8929 Other chronic pain: Secondary | ICD-10-CM | POA: Diagnosis not present

## 2023-03-23 DIAGNOSIS — E785 Hyperlipidemia, unspecified: Secondary | ICD-10-CM | POA: Diagnosis not present

## 2023-03-23 DIAGNOSIS — Z7982 Long term (current) use of aspirin: Secondary | ICD-10-CM | POA: Diagnosis not present

## 2023-03-23 DIAGNOSIS — M84452D Pathological fracture, left femur, subsequent encounter for fracture with routine healing: Secondary | ICD-10-CM | POA: Diagnosis not present

## 2023-03-23 DIAGNOSIS — R12 Heartburn: Secondary | ICD-10-CM | POA: Diagnosis not present

## 2023-03-23 DIAGNOSIS — M1990 Unspecified osteoarthritis, unspecified site: Secondary | ICD-10-CM | POA: Diagnosis not present

## 2023-03-23 DIAGNOSIS — M109 Gout, unspecified: Secondary | ICD-10-CM | POA: Diagnosis not present

## 2023-03-30 ENCOUNTER — Telehealth: Payer: Self-pay | Admitting: Family Medicine

## 2023-03-30 ENCOUNTER — Other Ambulatory Visit: Payer: Self-pay | Admitting: Family Medicine

## 2023-03-30 ENCOUNTER — Other Ambulatory Visit (HOSPITAL_COMMUNITY): Payer: Self-pay

## 2023-03-30 DIAGNOSIS — M84352D Stress fracture, left femur, subsequent encounter for fracture with routine healing: Secondary | ICD-10-CM | POA: Diagnosis not present

## 2023-03-30 MED ORDER — HYDROCODONE-ACETAMINOPHEN 10-325 MG PO TABS
1.0000 | ORAL_TABLET | ORAL | 0 refills | Status: DC
  Filled 2023-03-30: qty 75, 15d supply, fill #0

## 2023-03-30 NOTE — Telephone Encounter (Signed)
Nurses Patient has been under pain management with [place] Please let the patient know that his orthopedist did call us and released his pain management back to [place]  Previously we had him on hydrocodone 10 mg 1 every 4 hours as needed pain maximum 5/day  He has an appointment later in September Please talk with the patient-more than likely he needs a prescription for the hydrocodone 10 mg / 325 mg.  (It would be my advice for him to resume his previous pain management hydrocodone 10 mg / 325 mg.  Please talk with the patient gather more details regarding this and how he is doing then send me message regarding what you find out)

## 2023-03-30 NOTE — Telephone Encounter (Signed)
Patient states he will need a script sent to [facility].

## 2023-03-30 NOTE — Telephone Encounter (Signed)
I did have a discussion with his orthopedist-his pain management is back to his usual pain management  Nurses-please let the patient know that we sent in a 2-week supply for him, 2 weeks from now-he will need to give [place] an update  how he is doing then we can send an additional he has an appointment in mid September thank you

## 2023-03-30 NOTE — Telephone Encounter (Signed)
Patient states he has a follow up appointment for pain management 04/23/23 with Dr Lorin Picket

## 2023-03-31 DIAGNOSIS — R12 Heartburn: Secondary | ICD-10-CM | POA: Diagnosis not present

## 2023-03-31 DIAGNOSIS — M4722 Other spondylosis with radiculopathy, cervical region: Secondary | ICD-10-CM | POA: Diagnosis not present

## 2023-03-31 DIAGNOSIS — G8929 Other chronic pain: Secondary | ICD-10-CM | POA: Diagnosis not present

## 2023-03-31 DIAGNOSIS — M1990 Unspecified osteoarthritis, unspecified site: Secondary | ICD-10-CM | POA: Diagnosis not present

## 2023-03-31 DIAGNOSIS — M549 Dorsalgia, unspecified: Secondary | ICD-10-CM | POA: Diagnosis not present

## 2023-03-31 DIAGNOSIS — E785 Hyperlipidemia, unspecified: Secondary | ICD-10-CM | POA: Diagnosis not present

## 2023-03-31 DIAGNOSIS — M109 Gout, unspecified: Secondary | ICD-10-CM | POA: Diagnosis not present

## 2023-03-31 DIAGNOSIS — M84452D Pathological fracture, left femur, subsequent encounter for fracture with routine healing: Secondary | ICD-10-CM | POA: Diagnosis not present

## 2023-03-31 DIAGNOSIS — Z7982 Long term (current) use of aspirin: Secondary | ICD-10-CM | POA: Diagnosis not present

## 2023-03-31 DIAGNOSIS — K219 Gastro-esophageal reflux disease without esophagitis: Secondary | ICD-10-CM | POA: Diagnosis not present

## 2023-04-01 DIAGNOSIS — G8929 Other chronic pain: Secondary | ICD-10-CM | POA: Diagnosis not present

## 2023-04-01 DIAGNOSIS — M549 Dorsalgia, unspecified: Secondary | ICD-10-CM | POA: Diagnosis not present

## 2023-04-01 DIAGNOSIS — K219 Gastro-esophageal reflux disease without esophagitis: Secondary | ICD-10-CM | POA: Diagnosis not present

## 2023-04-01 DIAGNOSIS — E785 Hyperlipidemia, unspecified: Secondary | ICD-10-CM | POA: Diagnosis not present

## 2023-04-01 DIAGNOSIS — R12 Heartburn: Secondary | ICD-10-CM | POA: Diagnosis not present

## 2023-04-01 DIAGNOSIS — Z7982 Long term (current) use of aspirin: Secondary | ICD-10-CM | POA: Diagnosis not present

## 2023-04-01 DIAGNOSIS — M84452D Pathological fracture, left femur, subsequent encounter for fracture with routine healing: Secondary | ICD-10-CM | POA: Diagnosis not present

## 2023-04-01 DIAGNOSIS — M109 Gout, unspecified: Secondary | ICD-10-CM | POA: Diagnosis not present

## 2023-04-01 DIAGNOSIS — M1990 Unspecified osteoarthritis, unspecified site: Secondary | ICD-10-CM | POA: Diagnosis not present

## 2023-04-01 DIAGNOSIS — M4722 Other spondylosis with radiculopathy, cervical region: Secondary | ICD-10-CM | POA: Diagnosis not present

## 2023-04-02 ENCOUNTER — Other Ambulatory Visit: Payer: Self-pay | Admitting: Family Medicine

## 2023-04-02 ENCOUNTER — Other Ambulatory Visit (HOSPITAL_COMMUNITY): Payer: Self-pay

## 2023-04-04 ENCOUNTER — Other Ambulatory Visit (HOSPITAL_COMMUNITY): Payer: Self-pay

## 2023-04-07 ENCOUNTER — Other Ambulatory Visit (HOSPITAL_COMMUNITY): Payer: Self-pay

## 2023-04-07 MED ORDER — ATORVASTATIN CALCIUM 20 MG PO TABS
ORAL_TABLET | Freq: Every evening | ORAL | 1 refills | Status: DC
Start: 2023-04-07 — End: 2023-10-01
  Filled 2023-04-07: qty 30, 30d supply, fill #0
  Filled 2023-05-18: qty 30, 30d supply, fill #1
  Filled 2023-06-01 – 2023-06-11 (×2): qty 30, 30d supply, fill #2
  Filled 2023-07-20: qty 30, 30d supply, fill #3
  Filled 2023-08-11 – 2023-08-20 (×3): qty 30, 30d supply, fill #4
  Filled 2023-09-19: qty 30, 30d supply, fill #5

## 2023-04-07 MED ORDER — ALLOPURINOL 300 MG PO TABS
ORAL_TABLET | ORAL | 1 refills | Status: DC
Start: 2023-04-07 — End: 2023-10-01
  Filled 2023-04-07: qty 30, 30d supply, fill #0
  Filled 2023-05-18: qty 30, 30d supply, fill #1
  Filled 2023-06-01 – 2023-06-11 (×2): qty 30, 30d supply, fill #2
  Filled 2023-07-20: qty 30, 30d supply, fill #3
  Filled 2023-08-11 – 2023-08-20 (×3): qty 30, 30d supply, fill #4
  Filled 2023-09-19: qty 30, 30d supply, fill #5

## 2023-04-08 ENCOUNTER — Other Ambulatory Visit (HOSPITAL_COMMUNITY): Payer: Self-pay

## 2023-04-08 DIAGNOSIS — M109 Gout, unspecified: Secondary | ICD-10-CM | POA: Diagnosis not present

## 2023-04-08 DIAGNOSIS — M84452D Pathological fracture, left femur, subsequent encounter for fracture with routine healing: Secondary | ICD-10-CM | POA: Diagnosis not present

## 2023-04-08 DIAGNOSIS — K219 Gastro-esophageal reflux disease without esophagitis: Secondary | ICD-10-CM | POA: Diagnosis not present

## 2023-04-08 DIAGNOSIS — M1990 Unspecified osteoarthritis, unspecified site: Secondary | ICD-10-CM | POA: Diagnosis not present

## 2023-04-08 DIAGNOSIS — M4722 Other spondylosis with radiculopathy, cervical region: Secondary | ICD-10-CM | POA: Diagnosis not present

## 2023-04-08 DIAGNOSIS — G8929 Other chronic pain: Secondary | ICD-10-CM | POA: Diagnosis not present

## 2023-04-08 DIAGNOSIS — M549 Dorsalgia, unspecified: Secondary | ICD-10-CM | POA: Diagnosis not present

## 2023-04-08 DIAGNOSIS — E785 Hyperlipidemia, unspecified: Secondary | ICD-10-CM | POA: Diagnosis not present

## 2023-04-08 DIAGNOSIS — R12 Heartburn: Secondary | ICD-10-CM | POA: Diagnosis not present

## 2023-04-08 DIAGNOSIS — Z7982 Long term (current) use of aspirin: Secondary | ICD-10-CM | POA: Diagnosis not present

## 2023-04-08 MED ORDER — ONDANSETRON HCL 4 MG PO TABS
4.0000 mg | ORAL_TABLET | Freq: Four times a day (QID) | ORAL | 0 refills | Status: DC | PRN
Start: 2023-04-08 — End: 2023-04-24
  Filled 2023-04-08: qty 18, 5d supply, fill #0

## 2023-04-09 ENCOUNTER — Other Ambulatory Visit (HOSPITAL_COMMUNITY): Payer: Self-pay

## 2023-04-10 DIAGNOSIS — E785 Hyperlipidemia, unspecified: Secondary | ICD-10-CM | POA: Diagnosis not present

## 2023-04-10 DIAGNOSIS — Z7982 Long term (current) use of aspirin: Secondary | ICD-10-CM | POA: Diagnosis not present

## 2023-04-10 DIAGNOSIS — M4722 Other spondylosis with radiculopathy, cervical region: Secondary | ICD-10-CM | POA: Diagnosis not present

## 2023-04-10 DIAGNOSIS — G8929 Other chronic pain: Secondary | ICD-10-CM | POA: Diagnosis not present

## 2023-04-10 DIAGNOSIS — M84452D Pathological fracture, left femur, subsequent encounter for fracture with routine healing: Secondary | ICD-10-CM | POA: Diagnosis not present

## 2023-04-10 DIAGNOSIS — R12 Heartburn: Secondary | ICD-10-CM | POA: Diagnosis not present

## 2023-04-10 DIAGNOSIS — M109 Gout, unspecified: Secondary | ICD-10-CM | POA: Diagnosis not present

## 2023-04-10 DIAGNOSIS — K219 Gastro-esophageal reflux disease without esophagitis: Secondary | ICD-10-CM | POA: Diagnosis not present

## 2023-04-10 DIAGNOSIS — M549 Dorsalgia, unspecified: Secondary | ICD-10-CM | POA: Diagnosis not present

## 2023-04-10 DIAGNOSIS — M1990 Unspecified osteoarthritis, unspecified site: Secondary | ICD-10-CM | POA: Diagnosis not present

## 2023-04-14 ENCOUNTER — Other Ambulatory Visit (HOSPITAL_COMMUNITY): Payer: Self-pay

## 2023-04-16 ENCOUNTER — Other Ambulatory Visit (HOSPITAL_COMMUNITY): Payer: Self-pay

## 2023-04-17 ENCOUNTER — Other Ambulatory Visit (HOSPITAL_COMMUNITY): Payer: Self-pay

## 2023-04-20 ENCOUNTER — Other Ambulatory Visit (HOSPITAL_COMMUNITY): Payer: Self-pay

## 2023-04-23 ENCOUNTER — Encounter: Payer: Self-pay | Admitting: Family Medicine

## 2023-04-23 ENCOUNTER — Ambulatory Visit (INDEPENDENT_AMBULATORY_CARE_PROVIDER_SITE_OTHER): Payer: 59 | Admitting: Family Medicine

## 2023-04-23 ENCOUNTER — Other Ambulatory Visit (HOSPITAL_COMMUNITY): Payer: Self-pay

## 2023-04-23 VITALS — BP 138/84 | HR 90 | Wt 240.8 lb

## 2023-04-23 DIAGNOSIS — M5441 Lumbago with sciatica, right side: Secondary | ICD-10-CM

## 2023-04-23 DIAGNOSIS — M25562 Pain in left knee: Secondary | ICD-10-CM

## 2023-04-23 DIAGNOSIS — R1310 Dysphagia, unspecified: Secondary | ICD-10-CM | POA: Diagnosis not present

## 2023-04-23 DIAGNOSIS — G8929 Other chronic pain: Secondary | ICD-10-CM | POA: Diagnosis not present

## 2023-04-23 DIAGNOSIS — Z79891 Long term (current) use of opiate analgesic: Secondary | ICD-10-CM

## 2023-04-23 DIAGNOSIS — M5442 Lumbago with sciatica, left side: Secondary | ICD-10-CM

## 2023-04-23 DIAGNOSIS — R079 Chest pain, unspecified: Secondary | ICD-10-CM | POA: Diagnosis not present

## 2023-04-23 DIAGNOSIS — K2100 Gastro-esophageal reflux disease with esophagitis, without bleeding: Secondary | ICD-10-CM | POA: Diagnosis not present

## 2023-04-23 LAB — MED LIST ATTACHED SEPARATELY

## 2023-04-23 MED ORDER — HYDROCODONE-ACETAMINOPHEN 10-325 MG PO TABS
1.0000 | ORAL_TABLET | ORAL | 0 refills | Status: DC | PRN
Start: 2023-04-23 — End: 2023-04-28
  Filled 2023-04-23: qty 150, 30d supply, fill #0
  Filled 2023-04-23: qty 150, fill #0
  Filled 2023-04-23: qty 150, 30d supply, fill #0

## 2023-04-23 MED ORDER — PANTOPRAZOLE SODIUM 40 MG PO TBEC
40.0000 mg | DELAYED_RELEASE_TABLET | Freq: Two times a day (BID) | ORAL | 1 refills | Status: DC
Start: 2023-04-23 — End: 2023-07-08
  Filled 2023-04-23: qty 180, 90d supply, fill #0

## 2023-04-23 MED ORDER — SUCRALFATE 1 G PO TABS
1.0000 g | ORAL_TABLET | Freq: Three times a day (TID) | ORAL | 5 refills | Status: DC
Start: 2023-04-23 — End: 2023-05-21
  Filled 2023-04-23: qty 120, 30d supply, fill #0

## 2023-04-23 MED ORDER — HYDROCODONE-ACETAMINOPHEN 10-325 MG PO TABS
1.0000 | ORAL_TABLET | ORAL | 0 refills | Status: DC | PRN
Start: 2023-05-22 — End: 2023-04-28
  Filled 2023-04-23: qty 150, fill #0

## 2023-04-23 MED ORDER — HYDROCODONE-ACETAMINOPHEN 10-325 MG PO TABS
1.0000 | ORAL_TABLET | ORAL | 0 refills | Status: DC
Start: 2023-06-21 — End: 2023-04-29
  Filled 2023-04-23: qty 150, 25d supply, fill #0

## 2023-04-23 NOTE — Progress Notes (Signed)
Subjective:    Patient ID: Marcus Hayes, male    DOB: 03/21/1961, 62 y.o.   MRN: [id number]  HPI Very nice patient is going through some challenging times Recently had knee surgery Had severe pain with that was using oxycodone which was not helping enough He now states the pain is improving and he is back to his usual regiment He denies any setbacks other than feeling frustrated by his health  This patient was seen today for chronic pain  The medication list was reviewed and updated.   Location of Pain for which the patient has been treated with regarding narcotics: Lumbar pain with sciatica also left knee pain  Onset of this pain: Back pain been present for a long span of time knee pain more over the past several months   -Compliance with medication: Good compliance  - Number patient states they take daily: Typically 4 or 5/day  -Reason for ongoing use of opioids chronic pain and discomfort  What other measures have been tried outside of opioids Tylenol, surgery  In the ongoing specialists regarding this condition orthopedist  -when was the last dose patient took? This morning 0900  The patient was advised the importance of maintaining medication and not using illegal substances with these.  Here for refills and follow up  The patient was educated that we can provide 3 monthly scripts for their medication, it is their responsibility to follow the instructions.  Side effects or complications from medications: Denies any side effects  Patient is aware that pain medications are meant to minimize the severity of the pain to allow their pain levels to improve to allow for better function. They are aware of that pain medications cannot totally remove their pain.  Due for UDT ( at least once per year) (pain management contract is also completed at the time of the UDT): 03/31/2022-he will do 1 today  Scale of 1 to 10 ( 1 is least 10 is most) Your pain level without the  medicine: 10 Your pain level with medication 7   Scale 1 to 10 ( 1-helps very little, 10 helps very well) How well does your pain medication reduce your pain so you can function better through out the day?  7  Quality of the pain: Severe aching throbbing  Persistence of the pain: Present all the time  Modifying factors: Worse with activity  Gastroesophageal reflux disease with esophagitis without hemorrhage  Dysphagia, unspecified type  Chronic low back pain with bilateral sciatica, unspecified back pain laterality  Chronic pain of left knee  Chest pain, unspecified type - Plan: EKG 12-Lead  Encounter for long-term opiate analgesic use - Plan: ToxASSURE Select 13 (MW), Urine  Patient also complains of severe reflux related issues esophageal burning and discomfort he states that been going on for several weeks hurts to swallow when he drinks or eats it causes severe pain also at times it closes up makes it difficult for him to wean get liquid or food down other times it does not do that he has had some choking spells but no vomiting no breathing issues  Given the esophageal burning and pain discomfort we will do a EKG make sure we are not seeing ST segment elevation         Review of Systems     Objective:   Physical Exam General-in no acute distress Eyes-no discharge Lungs-respiratory rate normal, CTA CV-no murmurs,RRR Extremities skin warm dry no edema Neuro grossly normal Behavior normal, alert  Abdomen soft no guarding rebound or tenderness mild epigastric tenderness is noted       Assessment & Plan:  1. Gastroesophageal reflux disease with esophagitis without hemorrhage Recommend sucralfate 4 times daily Crush dissolved into liquid and drink Pantoprazole twice daily GI referral Warning signs when to go to ER discussed  2. Dysphagia, unspecified type Small bites chew food use with liquid  3. Chronic low back pain with bilateral sciatica, unspecified back  pain laterality Stretching exercises recommended  4. Chronic pain of left knee Follow through with orthopedics  5. Chest pain, unspecified type No true angina but EKG does not show any acute changes I doubt that this is coronary artery disease the clinical picture fits with esophagitis - EKG 12-Lead  6. Encounter for long-term opiate analgesic use The patient was seen in followup for chronic pain. A review over at their current pain status was discussed. Drug registry was checked. Prescriptions were given.  Regular follow-up recommended. Discussion was held regarding the importance of compliance with medication as well as pain medication contract.  Patient was informed that medication may cause drowsiness and should not be combined  with other medications/alcohol or street drugs. If the patient feels medication is causing altered alertness then do not drive or operate dangerous equipment.  Should be noted that the patient appears to be meeting appropriate use of opioids and response.  Evidenced by improved function and decent pain control without significant side effects and no evidence of overt aberrancy issues.  Upon discussion with the patient today they understand that opioid therapy is optional and they feel that the pain has been refractory to reasonable conservative measures and is significant and affecting quality of life enough to warrant ongoing therapy and wishes to continue opioids.  Refills were provided.  [facility] medical Board guidelines regarding the pain medicine has been reviewed.  CDC guidelines most updated 2022 has been reviewed by the prescriber.  PDMP is checked on a regular basis yearly urine drug screen and pain management contract  The patient states that he will manage with the current pain medicine.  He understands the need to follow-up if any ongoing troubles follow-up in 3 months - ToxASSURE Select 13 (MW), Urine

## 2023-04-24 ENCOUNTER — Other Ambulatory Visit (HOSPITAL_COMMUNITY): Payer: Self-pay

## 2023-04-24 ENCOUNTER — Other Ambulatory Visit: Payer: Self-pay | Admitting: Nurse Practitioner

## 2023-04-24 DIAGNOSIS — G8929 Other chronic pain: Secondary | ICD-10-CM | POA: Diagnosis not present

## 2023-04-24 DIAGNOSIS — K219 Gastro-esophageal reflux disease without esophagitis: Secondary | ICD-10-CM | POA: Diagnosis not present

## 2023-04-24 DIAGNOSIS — R12 Heartburn: Secondary | ICD-10-CM | POA: Diagnosis not present

## 2023-04-24 DIAGNOSIS — M1990 Unspecified osteoarthritis, unspecified site: Secondary | ICD-10-CM | POA: Diagnosis not present

## 2023-04-24 DIAGNOSIS — M4722 Other spondylosis with radiculopathy, cervical region: Secondary | ICD-10-CM | POA: Diagnosis not present

## 2023-04-24 DIAGNOSIS — M84452D Pathological fracture, left femur, subsequent encounter for fracture with routine healing: Secondary | ICD-10-CM | POA: Diagnosis not present

## 2023-04-24 DIAGNOSIS — M109 Gout, unspecified: Secondary | ICD-10-CM | POA: Diagnosis not present

## 2023-04-24 DIAGNOSIS — M549 Dorsalgia, unspecified: Secondary | ICD-10-CM | POA: Diagnosis not present

## 2023-04-24 DIAGNOSIS — Z7982 Long term (current) use of aspirin: Secondary | ICD-10-CM | POA: Diagnosis not present

## 2023-04-24 DIAGNOSIS — E785 Hyperlipidemia, unspecified: Secondary | ICD-10-CM | POA: Diagnosis not present

## 2023-04-24 NOTE — Addendum Note (Signed)
Addended by: Elizbeth Squires on: 04/24/2023 12:12 PM   Modules accepted: Orders

## 2023-04-26 ENCOUNTER — Other Ambulatory Visit (HOSPITAL_COMMUNITY): Payer: Self-pay

## 2023-04-26 MED ORDER — ONDANSETRON HCL 4 MG PO TABS
4.0000 mg | ORAL_TABLET | Freq: Four times a day (QID) | ORAL | 4 refills | Status: DC | PRN
  Filled 2023-04-26: qty 20, 23d supply, fill #0

## 2023-04-27 ENCOUNTER — Other Ambulatory Visit (HOSPITAL_COMMUNITY): Payer: Self-pay
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
## 2023-04-28 ENCOUNTER — Ambulatory Visit: Payer: 59 | Admitting: Gastroenterology

## 2023-04-28 ENCOUNTER — Other Ambulatory Visit (HOSPITAL_COMMUNITY): Payer: Self-pay

## 2023-04-28 ENCOUNTER — Encounter: Payer: Self-pay | Admitting: Gastroenterology

## 2023-04-28 ENCOUNTER — Ambulatory Visit (INDEPENDENT_AMBULATORY_CARE_PROVIDER_SITE_OTHER): Payer: 59 | Admitting: Gastroenterology

## 2023-04-28 VITALS — BP 120/82 | HR 90 | Temp 97.5°F | Ht 71.0 in | Wt 243.1 lb

## 2023-04-28 DIAGNOSIS — K219 Gastro-esophageal reflux disease without esophagitis: Secondary | ICD-10-CM

## 2023-04-28 DIAGNOSIS — R09A2 Foreign body sensation, throat: Secondary | ICD-10-CM

## 2023-04-28 DIAGNOSIS — R070 Pain in throat: Secondary | ICD-10-CM

## 2023-04-28 MED ORDER — LIDOCAINE VISCOUS HCL 2 % MT SOLN
15.0000 mL | OROMUCOSAL | 0 refills | Status: DC | PRN
  Filled 2023-04-28: qty 30, 2d supply, fill #0

## 2023-04-28 MED ORDER — ESOMEPRAZOLE MAGNESIUM 40 MG PO CPDR
40.0000 mg | DELAYED_RELEASE_CAPSULE | Freq: Two times a day (BID) | ORAL | 2 refills | Status: DC
  Filled 2023-04-28: qty 60, 30d supply, fill #0
  Filled 2023-06-05: qty 60, 30d supply, fill #1
  Filled 2023-07-09 – 2023-07-20 (×2): qty 60, 30d supply, fill #2

## 2023-04-28 NOTE — Patient Instructions (Addendum)
Happy belated birthday!  Stop pantoprazole and start Nexium 40 mg twice daily.  I have sent in viscous lidocaine to the pharmacy for you.  You will mix 15 mL of this with 15 mL of Maalox and take as needed to help with some immediate relief of the burning.  Follow a GERD diet:  Avoid fried, fatty, greasy, spicy, citrus foods. Avoid caffeine and carbonated beverages. Avoid chocolate. Try eating 4-6 small meals a day rather than 3 large meals. Do not eat within 3 hours of laying down. Prop head of bed up on wood or bricks to create a 6 inch incline.  We will call you to get you scheduled for your upper endoscopy.  See below for tea that can be soothing to the esophagus. Mixing honey in it is also anti-inflammatory and can help soothe your esophagus.  What Tea is Good for Acid Reflux? Drinking tea for acid reflux and GERD can prevent triggering your symptoms or help relieve the uncomfortable feelings associated with them.  If you're wondering what tea is best for acid reflux, many options are great for calming symptoms like heartburn and nausea. The best options for tea for acid reflux and GERD are caffeine-free herbal teas. Herbal teas are made from different spices, herbs, and plants and are known for their many health benefits. Below are eight of the best herbal teas for acid reflux and GERD. 1. Ginger Tea Ginger tea for acid reflux is extremely beneficial in small doses. It is naturally rich in compounds and antioxidants that can provide many health benefits, such as reducing inflammation and other symptoms. The compounds in ginger can decrease the likelihood of stomach acid traveling to the esophagus. These chemicals can ease gastric contractions and calm gastrointestinal irritation. Drinking ginger tea for acid reflux also relieves nausea.   2. Licorice Tea  Another great tea for acid reflux is licorice tea. Licorice root has been used as an herbal remedy since ancient times. Studies  reveal the benefits of using licorice root tea for acid reflux and GERD to treat symptoms like heartburn, inflammation, and upset stomach. The primary active compound in licorice root, glycyrrhizin, can increase mucus, protecting the esophagus and stomach from acid. However, there are risks associated with consuming too much glycyrrhizin. Because of this, some licorice products are processed to lower levels of glycyrrhizin, making it safer for consumption. 3. Chamomile Tea Chamomile tea is commonly used to fight symptoms of acid reflux and GERD. It's known for its anti-inflammatory properties and ability to reduce digestive symptoms such as upset stomach. Consuming chamomile tea can also reduce stress which is a key trigger for acid reflux and GERD symptoms. 4. Slippery Elm Tea The inner bark of the slippery elm tree is commonly ingested for medicinal purposes. When mixed with water, it produces a gel that has great coating properties. Drinking slippery elm tea for acid reflux coats the inner lining of the gastrointestinal tract, soothing and protecting it from further inflammation. It also can stimulate mucus production, which can further reduce irritation of the stomach and esophagus.  5. Marshmallow Root Tea Marshmallow root is an herb that has been used to treat digestive issues for thousands of years. Consuming marshmallow root tea for acid reflux soothes irritation and inflammation in the digestive tract by forming a protective layer of tissue. It also stimulates the cells responsible for tissue regeneration. 6. Turmeric Tea  Turmeric is a spice that has been used in alternative medication since ancient times. Consuming turmeric tea  for acid reflux and GERD is abundantly beneficial because of turmeric's anti-inflammatory and antioxidant properties. It is used often as a treatment for gastrointestinal issues such as inflammation, stomach ulcers, and heartburn. Curcumin, turmeric's primary active  ingredient, protects the digestive tract from damage. 7. Fennel Tea Fennel tea is brewed from the flowers and stems of the fennel herb. It is suggested to have the ability to support digestion and aid in relieving digestive issues such as upset stomach and excess gas. Its anti-inflammatory properties can nurture swelling in your digestive tract, making it an excellent tea for acid reflux and GERD.  Other Drinks for Acid Reflux Many other drinks can help reduce the symptoms of acid reflux and GERD. Here are some of the best options: Water Drinking water helps move food throughout your digestive tract, reducing acid reflux and GERD symptoms. Alkaline water, in particular, has a higher pH and can neutralize your stomach acid. Coconut Water Unsweetened coconut water provides a great source of electrolytes and promotes a healthy pH balance in your body which is essential when managing acid reflux and GERD. Juices and Smoothies Low-acidic juices and smoothies with neutral or alkaline pHs are less likely to trigger symptoms of acid reflux. Green vegetables like spinach and kale are great choices to increase your vitamin and mineral intake while fighting acid reflux. Some of the best ingredients for juices and smoothies are: Carrots Aloe vera juice Spinach Cabbage Pear Beets Watermelon Cucumber Papaya Plant-based Milk Drinking plant-based milk as an alternative to consuming dairy may reduce your symptoms. Many people have trouble digesting cow's milk, which leads to an increase in acid reflux symptoms. Plant-based milk has a lower fat content than cow's milk making it a better option for people who suffer from acid reflux and GERD. Great options for plant-based milk include: Coconut milk Almond milk Soy milk Oat milk Cashew milk Flax milk  Drinks to Avoid Some drinks have a reputation for aggravating symptoms of acid reflux and GERD. It's best to avoid these drinks as much as  possible. Acidic Juices Highly acidic juices irritate the esophagus. Some juices to avoid include: Tomato juice Grapefruit juice Tangerine juice Lime juice Lemon juice Orange juice   Alcohol Consuming alcohol is known to irritate symptoms of acid reflux. Heavy alcohol consumption can damage the mucous lining in the stomach and esophagus, leading to the development of GERD. Coffee and Other Caffeinated Drinks Caffeine, found in drinks like coffee and green tea, increases secretions of gastric acid, raising the chances of acid rising to your esophagus and aggravating acid reflux.

## 2023-04-29 ENCOUNTER — Other Ambulatory Visit (HOSPITAL_COMMUNITY): Payer: Self-pay

## 2023-04-29 ENCOUNTER — Other Ambulatory Visit: Payer: Self-pay | Admitting: Family Medicine

## 2023-04-29 ENCOUNTER — Telehealth: Payer: Self-pay

## 2023-04-29 DIAGNOSIS — Z79891 Long term (current) use of opiate analgesic: Secondary | ICD-10-CM

## 2023-04-29 MED ORDER — HYDROCODONE-ACETAMINOPHEN 10-325 MG PO TABS
1.0000 | ORAL_TABLET | ORAL | 0 refills | Status: DC
  Filled 2023-07-01: qty 150, 25d supply, fill #0

## 2023-04-29 MED ORDER — HYDROCODONE-ACETAMINOPHEN 10-325 MG PO TABS
1.0000 | ORAL_TABLET | ORAL | 0 refills | Status: DC | PRN
  Filled 2023-04-29: qty 150, fill #0
  Filled 2023-06-01: qty 150, 30d supply, fill #0

## 2023-04-29 MED ORDER — HYDROCODONE-ACETAMINOPHEN 10-325 MG PO TABS
1.0000 | ORAL_TABLET | ORAL | 0 refills | Status: DC | PRN
Start: 2023-04-29 — End: 2023-07-08
  Filled 2023-04-29: qty 150, 30d supply, fill #0

## 2023-04-29 NOTE — Telephone Encounter (Signed)
Call received from patient at the pharmacy expecting his pain medication to be called in - Norco 10/325 mg , please advise.

## 2023-04-29 NOTE — Telephone Encounter (Signed)
Prescriptions were taking care of today was sent to [facility]

## 2023-04-30 ENCOUNTER — Other Ambulatory Visit (HOSPITAL_COMMUNITY): Payer: Self-pay

## 2023-04-30 LAB — SPECIMEN STATUS REPORT

## 2023-04-30 LAB — TOXASSURE SELECT 13 (MW), URINE

## 2023-05-18 ENCOUNTER — Other Ambulatory Visit (HOSPITAL_COMMUNITY): Payer: Self-pay

## 2023-05-21 ENCOUNTER — Ambulatory Visit (HOSPITAL_COMMUNITY)
Admission: RE | Admit: 2023-05-21 | Discharge: 2023-05-21 | Disposition: A | Discharge status: Home / Self Care | Payer: 59 | Attending: Internal Medicine | Admitting: Internal Medicine

## 2023-05-21 ENCOUNTER — Encounter (HOSPITAL_COMMUNITY)
Admission: RE | Disposition: A | Discharge status: Home / Self Care | Payer: Self-pay | Source: Home / Self Care | Attending: Internal Medicine

## 2023-05-21 ENCOUNTER — Ambulatory Visit (HOSPITAL_BASED_OUTPATIENT_CLINIC_OR_DEPARTMENT_OTHER): Payer: 59 | Admitting: Anesthesiology

## 2023-05-21 ENCOUNTER — Ambulatory Visit (HOSPITAL_COMMUNITY): Payer: 59 | Admitting: Anesthesiology

## 2023-05-21 ENCOUNTER — Other Ambulatory Visit: Payer: Self-pay

## 2023-05-21 ENCOUNTER — Encounter (HOSPITAL_COMMUNITY): Payer: Self-pay | Admitting: Internal Medicine

## 2023-05-21 DIAGNOSIS — R1310 Dysphagia, unspecified: Secondary | ICD-10-CM | POA: Diagnosis not present

## 2023-05-21 DIAGNOSIS — F17210 Nicotine dependence, cigarettes, uncomplicated: Secondary | ICD-10-CM | POA: Insufficient documentation

## 2023-05-21 DIAGNOSIS — Z79899 Other long term (current) drug therapy: Secondary | ICD-10-CM | POA: Insufficient documentation

## 2023-05-21 DIAGNOSIS — K222 Esophageal obstruction: Secondary | ICD-10-CM

## 2023-05-21 DIAGNOSIS — K219 Gastro-esophageal reflux disease without esophagitis: Secondary | ICD-10-CM | POA: Diagnosis not present

## 2023-05-21 HISTORY — PX: MALONEY DILATION: SHX5535

## 2023-05-21 HISTORY — PX: ESOPHAGOGASTRODUODENOSCOPY (EGD) WITH PROPOFOL: SHX5813

## 2023-05-21 SURGERY — ESOPHAGOGASTRODUODENOSCOPY (EGD) WITH PROPOFOL
Anesthesia: General

## 2023-05-21 MED ORDER — LIDOCAINE HCL (CARDIAC) PF 100 MG/5ML IV SOSY
PREFILLED_SYRINGE | INTRAVENOUS | Status: DC | PRN
  Administered 2023-05-21: 60 mg via INTRAVENOUS

## 2023-05-21 MED ORDER — LACTATED RINGERS IV SOLN: INTRAVENOUS | Status: DC | PRN

## 2023-05-21 MED ORDER — PROPOFOL 10 MG/ML IV BOLUS
INTRAVENOUS | Status: DC | PRN
  Administered 2023-05-21: 100 mg via INTRAVENOUS
  Administered 2023-05-21: 40 mg via INTRAVENOUS

## 2023-05-21 MED ORDER — SODIUM CHLORIDE 0.9% FLUSH: 10.0000 mL | Freq: Two times a day (BID) | INTRAVENOUS | Status: DC

## 2023-05-21 MED ORDER — PROPOFOL 500 MG/50ML IV EMUL
INTRAVENOUS | Status: DC | PRN
  Administered 2023-05-21: 150 ug/kg/min via INTRAVENOUS

## 2023-05-21 NOTE — Anesthesia Preprocedure Evaluation (Signed)
Anesthesia Evaluation  Patient identified by MRN, date of birth, ID band Patient awake    Reviewed: Allergy & Precautions, H&P , NPO status , Patient's Chart, lab work & pertinent test results, reviewed documented beta blocker date and time   History of Anesthesia Complications (+) Family history of anesthesia reaction  Airway Mallampati: II  TM Distance: >3 FB Neck ROM: full    Dental no notable dental hx.    Pulmonary neg pulmonary ROS, Current Smoker and Patient abstained from smoking.   Pulmonary exam normal breath sounds clear to auscultation       Cardiovascular Exercise Tolerance: Good negative cardio ROS  Rhythm:regular Rate:Normal     Neuro/Psych  Neuromuscular disease negative neurological ROS  negative psych ROS   GI/Hepatic negative GI ROS, Neg liver ROS,GERD  ,,  Endo/Other  negative endocrine ROS    Renal/GU negative Renal ROS  negative genitourinary   Musculoskeletal   Abdominal   Peds  Hematology negative hematology ROS (+)   Anesthesia Other Findings   Reproductive/Obstetrics negative OB ROS                             Anesthesia Physical Anesthesia Plan  ASA: 2  Anesthesia Plan: General   Post-op Pain Management:    Induction:   PONV Risk Score and Plan: Propofol infusion  Airway Management Planned:   Additional Equipment:   Intra-op Plan:   Post-operative Plan:   Informed Consent: I have reviewed the patients History and Physical, chart, labs and discussed the procedure including the risks, benefits and alternatives for the proposed anesthesia with the patient or authorized representative who has indicated his/her understanding and acceptance.     Dental Advisory Given  Plan Discussed with: CRNA  Anesthesia Plan Comments:        Anesthesia Quick Evaluation

## 2023-05-21 NOTE — Transfer of Care (Signed)
Immediate Anesthesia Transfer of Care Note  Patient: Marcus Hayes  Procedure(s) Performed: ESOPHAGOGASTRODUODENOSCOPY (EGD) WITH PROPOFOL MALONEY DILATION  Patient Location: Endoscopy Unit  Anesthesia Type:General  Level of Consciousness: drowsy  Airway & Oxygen Therapy: Patient Spontanous Breathing  Post-op Assessment: Report given to RN and Post -op Vital signs reviewed and stable  Post vital signs: Reviewed and stable  Last Vitals:  Vitals Value Taken Time  BP 103/93 05/21/23 1320  Temp 36.5 C 05/21/23 1320  Pulse 76 05/21/23 1320  Resp 18 05/21/23 1320  SpO2 94 % 05/21/23 1320    Last Pain:  Vitals:   05/21/23 1320  TempSrc: Oral  PainSc:       Patients Stated Pain Goal: 8 (05/21/23 1042)  Complications: No notable events documented.

## 2023-05-21 NOTE — Discharge Instructions (Addendum)
EGD Discharge instructions Please read the instructions outlined below and refer to this sheet in the next few weeks. These discharge instructions provide you with general information on caring for yourself after you leave the hospital. Your doctor may also give you specific instructions. While your treatment has been planned according to the most current medical practices available, unavoidable complications occasionally occur. If you have any problems or questions after discharge, please call your doctor. ACTIVITY You may resume your regular activity but move at a slower pace for the next 24 hours.  Take frequent rest periods for the next 24 hours.  Walking will help expel (get rid of) the air and reduce the bloated feeling in your abdomen.  No driving for 24 hours (because of the anesthesia (medicine) used during the test).  You may shower.  Do not sign any important legal documents or operate any machinery for 24 hours (because of the anesthesia used during the test).  NUTRITION Drink plenty of fluids.  You may resume your normal diet.  Begin with a light meal and progress to your normal diet.  Avoid alcoholic beverages for 24 hours or as instructed by your caregiver.  MEDICATIONS You may resume your normal medications unless your caregiver tells you otherwise.  WHAT YOU CAN EXPECT TODAY You may experience abdominal discomfort such as a feeling of fullness or "gas" pains.  FOLLOW-UP Your doctor will discuss the results of your test with you.  SEEK IMMEDIATE MEDICAL ATTENTION IF ANY OF THE FOLLOWING OCCUR: Excessive nausea (feeling sick to your stomach) and/or vomiting.  Severe abdominal pain and distention (swelling).  Trouble swallowing.  Temperature over 101 F (37.8 C).  Rectal bleeding or vomiting of blood.      No new findings today.  Your ring was dilated.    Continue Nexium 40 mg twice daily as you are currently taking   call me in 1 month and let me know how this  medication is working for you as far as your reflux is concerned   office visit with Brooke Bonito in 2 months   at patient request, I called Jashad Depaula at [phone] -

## 2023-05-21 NOTE — Interval H&P Note (Signed)
History and Physical Interval Note:  05/21/2023 12:52 PM  Celesta Aver  has presented today for surgery, with the diagnosis of gerd, dysphagia/globus sensation.  The various methods of treatment have been discussed with the patient and family. After consideration of risks, benefits and other options for treatment, the patient has consented to  Procedure(s) with comments: ESOPHAGOGASTRODUODENOSCOPY (EGD) WITH PROPOFOL (N/A) - 11:30am;asa 2 MALONEY DILATION (N/A) - 11:30am;asa 2 as a surgical intervention.  The patient's history has been reviewed, patient examined, no change in status, stable for surgery.  I have reviewed the patient's chart and labs.  Questions were answered to the patient's satisfaction.     Korde Jeppsen   no change.  Just started Nexium twice daily.  EGD with esophageal dilation is feasible/appropriate today per plan.  The risks, benefits, limitations, alternatives and imponderables have been reviewed with the patient. Potential for esophageal dilation, biopsy, etc. have also been reviewed.  Questions have been answered. All parties agreeable.

## 2023-05-24 NOTE — Anesthesia Postprocedure Evaluation (Signed)
Anesthesia Post Note  Patient: Dell Santellan  Procedure(s) Performed: ESOPHAGOGASTRODUODENOSCOPY (EGD) WITH PROPOFOL MALONEY DILATION  Patient location during evaluation: Phase II Anesthesia Type: General Level of consciousness: awake Pain management: pain level controlled Vital Signs Assessment: post-procedure vital signs reviewed and stable Respiratory status: spontaneous breathing and respiratory function stable Cardiovascular status: blood pressure returned to baseline and stable Postop Assessment: no headache and no apparent nausea or vomiting Anesthetic complications: no Comments: Late entry   No notable events documented.   Last Vitals:  Vitals:   05/21/23 1042 05/21/23 1320  BP: 128/84 (!) 103/93  Pulse: 80 76  Resp: 16 18  Temp: 36.8 C 36.5 C  SpO2:  94%    Last Pain:  Vitals:   05/21/23 1323  TempSrc:   PainSc: 0-No pain                 Windell Norfolk

## 2023-05-24 NOTE — Op Note (Addendum)
[facility] Patient Name: Marcus Hayes Procedure Date: 05/21/2023 12:22 PM MRN: [id number] Date of Birth: Jun 14, 1961 Attending MD: Gennette Pac , MD, [id number] CSN: [id number] Age: 62 Admit Type: Outpatient Procedure:                Upper GI endoscopy Indications:              Dysphagia Providers:                Gennette Pac, MD, Nena Polio, RN, Lennice Sites Technician, Technician Referring MD:              Medicines:                Propofol per Anesthesia Complications:            No immediate complications. Estimated Blood Loss:     Estimated blood loss was minimal. Procedure:                Pre-Anesthesia Assessment:                           - Prior to the procedure, a History and Physical                            was performed, and patient medications and                            allergies were reviewed. The patient's tolerance of                            previous anesthesia was also reviewed. The risks                            and benefits of the procedure and the sedation                            options and risks were discussed with the patient.                            All questions were answered, and informed consent                            was obtained. Prior Anticoagulants: The patient has                            taken no anticoagulant or antiplatelet agents. ASA                            Grade Assessment: II - A patient with mild systemic                            disease. After reviewing the risks and benefits,  the patient was deemed in satisfactory condition to                            undergo the procedure.                           After obtaining informed consent, the endoscope was                            passed under direct vision. Throughout the                            procedure, the patient's blood pressure, pulse, and                            oxygen  saturations were monitored continuously. The                            [id number]-[id number] ([id number]) scope was introduced through the                            mouth, and advanced to the second part of duodenum.                            The upper GI endoscopy was accomplished without                            difficulty. The patient tolerated the procedure                            well.                           Technical system wide computer issues compromised                            report; images captured could not incorporated into                            note. Scope In: 1:04:57 PM Scope Out: 1:15:35 PM Total Procedure Duration: 0 hours 10 minutes 38 seconds  Findings:      A moderate Schatzki ring was found at the gastroesophageal junction. No       mass. No Barrett's esophagus. No esophagitis. Gastric cavity empty. The       scope was withdrawn. Dilation was performed with a Maloney dilator with       mild resistance at 56 Fr. Dilation was performed with a Maloney dilator       with mild resistance at 58 Fr. Dilation was attempted, but the lesion       was not amenable to treatment with a Maloney dilator because the dilator       could not be passed at 60 Fr. The dilation site was examined and showed       moderate mucosal disruption. Estimated blood loss was minimal.      The entire examined stomach was normal. Patent pylorus.  The duodenal bulb and second portion of the duodenum were normal. Impression:               - Moderate Schatzki ring. Dilated. As this                            described above.                           - Normal stomach.                           - Normal duodenal bulb and second portion of the                            duodenum.                           - No specimens collected. Moderate Sedation:      Moderate (conscious) sedation was personally administered by an       anesthesia professional. The following parameters were monitored: oxygen        saturation, heart rate, blood pressure, respiratory rate, EKG, adequacy       of pulmonary ventilation, and response to care. Recommendation:           - Patient has a contact number available for                            emergencies. The signs and symptoms of potential                            delayed complications were discussed with the                            patient. Return to normal activities tomorrow.                            Written discharge instructions were provided to the                            patient.                           - Advance diet as tolerated.                           - Continue present medications.                           -Continue Nexium 40 mg orally twice daily for the                            next month. Call me in 1 month let me know has                            medication is working for you. If it is  unsatisfactory, patient to call me in 1 month and                            let me know how Nexium is working. If inadequate                            response, will continue to consider switching him                            to P cab. Procedure Code(s):        --- Professional ---                           [phone], Esophagogastroduodenoscopy, flexible,                            transoral; diagnostic, including collection of                            specimen(s) by brushing or washing, when performed                            (separate procedure)                           43450, Dilation of esophagus, by unguided sound or                            bougie, single or multiple passes Diagnosis Code(s):        --- Professional ---                           K22.2, Esophageal obstruction                           R13.10, Dysphagia, unspecified CPT copyright 2022 [employer]. All rights reserved. The codes documented in this report are preliminary and upon coder review may  be revised to  meet current compliance requirements. Gerrit Friends. Cayci Mcnabb, MD Gennette Pac, MD 05/24/2023 11:56:48 AM This report has been signed electronically. Number of Addenda: 0

## 2023-05-25 DIAGNOSIS — M84352D Stress fracture, left femur, subsequent encounter for fracture with routine healing: Secondary | ICD-10-CM | POA: Diagnosis not present

## 2023-05-28 ENCOUNTER — Encounter (HOSPITAL_COMMUNITY): Payer: Self-pay | Admitting: Internal Medicine

## 2023-06-01 ENCOUNTER — Other Ambulatory Visit (HOSPITAL_COMMUNITY): Payer: Self-pay

## 2023-06-01 ENCOUNTER — Telehealth: Payer: Self-pay | Admitting: Gastroenterology

## 2023-06-01 NOTE — Telephone Encounter (Signed)
Patient's wife called to see if the acid reflux medication had been called in.

## 2023-06-02 NOTE — Telephone Encounter (Signed)
Dr. Jena Gauss requested that I send this to you to decide whether you wanted to change it or have the pt continue taking it a little while longer.

## 2023-06-02 NOTE — Telephone Encounter (Signed)
Pt's wife was seen by Dr. Jena Gauss today. Pt was in the room with her and stated to Dr. Jena Gauss that the esomeprazole 40 mg twice daily is helping but is not adequately treating his reflux. Pt is requesting something else to be called in if possible.

## 2023-06-03 ENCOUNTER — Encounter: Payer: Self-pay | Admitting: *Deleted

## 2023-06-04 NOTE — Telephone Encounter (Signed)
Sent pt a [employer] geologist. Pt read message.

## 2023-06-05 ENCOUNTER — Other Ambulatory Visit (HOSPITAL_COMMUNITY): Payer: Self-pay

## 2023-06-10 DIAGNOSIS — M5451 Vertebrogenic low back pain: Secondary | ICD-10-CM | POA: Diagnosis not present

## 2023-06-10 DIAGNOSIS — M5459 Other low back pain: Secondary | ICD-10-CM | POA: Diagnosis not present

## 2023-06-20 ENCOUNTER — Other Ambulatory Visit (HOSPITAL_COMMUNITY): Payer: Self-pay

## 2023-06-23 ENCOUNTER — Encounter: Payer: Self-pay | Admitting: Gastroenterology

## 2023-06-29 DIAGNOSIS — M5459 Other low back pain: Secondary | ICD-10-CM | POA: Diagnosis not present

## 2023-07-01 ENCOUNTER — Other Ambulatory Visit (HOSPITAL_COMMUNITY): Payer: Self-pay

## 2023-07-03 ENCOUNTER — Other Ambulatory Visit (HOSPITAL_COMMUNITY): Payer: Self-pay

## 2023-07-06 DIAGNOSIS — M25562 Pain in left knee: Secondary | ICD-10-CM | POA: Diagnosis not present

## 2023-07-08 ENCOUNTER — Ambulatory Visit: Payer: 59 | Admitting: Family Medicine

## 2023-07-08 ENCOUNTER — Other Ambulatory Visit (HOSPITAL_COMMUNITY): Payer: Self-pay

## 2023-07-08 ENCOUNTER — Encounter: Payer: Self-pay | Admitting: Family Medicine

## 2023-07-08 VITALS — BP 118/78 | HR 84 | Temp 97.7°F | Ht 71.0 in | Wt 251.6 lb

## 2023-07-08 DIAGNOSIS — M1000 Idiopathic gout, unspecified site: Secondary | ICD-10-CM | POA: Diagnosis not present

## 2023-07-08 DIAGNOSIS — D72829 Elevated white blood cell count, unspecified: Secondary | ICD-10-CM | POA: Diagnosis not present

## 2023-07-08 DIAGNOSIS — E7849 Other hyperlipidemia: Secondary | ICD-10-CM | POA: Diagnosis not present

## 2023-07-08 DIAGNOSIS — M5442 Lumbago with sciatica, left side: Secondary | ICD-10-CM | POA: Diagnosis not present

## 2023-07-08 DIAGNOSIS — Z125 Encounter for screening for malignant neoplasm of prostate: Secondary | ICD-10-CM | POA: Diagnosis not present

## 2023-07-08 DIAGNOSIS — Z79899 Other long term (current) drug therapy: Secondary | ICD-10-CM | POA: Diagnosis not present

## 2023-07-08 DIAGNOSIS — M5441 Lumbago with sciatica, right side: Secondary | ICD-10-CM

## 2023-07-08 DIAGNOSIS — G8929 Other chronic pain: Secondary | ICD-10-CM

## 2023-07-08 DIAGNOSIS — Z23 Encounter for immunization: Secondary | ICD-10-CM | POA: Diagnosis not present

## 2023-07-08 DIAGNOSIS — Z79891 Long term (current) use of opiate analgesic: Secondary | ICD-10-CM | POA: Diagnosis not present

## 2023-07-08 DIAGNOSIS — M25562 Pain in left knee: Secondary | ICD-10-CM | POA: Diagnosis not present

## 2023-07-08 MED ORDER — HYDROCODONE-ACETAMINOPHEN 10-325 MG PO TABS
1.0000 | ORAL_TABLET | ORAL | 0 refills | Status: DC | PRN
  Filled 2023-07-08 – 2023-08-28 (×4): qty 150, 25d supply, fill #0

## 2023-07-08 MED ORDER — HYDROCODONE-ACETAMINOPHEN 10-325 MG PO TABS
1.0000 | ORAL_TABLET | ORAL | 0 refills | Status: DC | PRN
  Filled 2023-07-08: qty 150, 25d supply, fill #0
  Filled 2023-07-30 – 2023-07-31 (×2): qty 150, 30d supply, fill #0

## 2023-07-08 MED ORDER — HYDROCODONE-ACETAMINOPHEN 10-325 MG PO TABS
1.0000 | ORAL_TABLET | ORAL | 0 refills | Status: DC
  Filled 2023-07-08 – 2023-08-28 (×4): qty 150, 25d supply, fill #0
  Filled 2023-10-01: qty 150, 30d supply, fill #0

## 2023-07-08 NOTE — Progress Notes (Signed)
Subjective:    Patient ID: Marcus Hayes, male    DOB: 01/05/1961, 63 y.o.   MRN: [id number]  Discussed the use of AI scribe software for clinical note transcription with the patient, who gave verbal consent to proceed.  History of Present Illness   The patient, with a history of chronic knee and back pain, reports persistent discomfort in these areas. He recently underwent an MRI for his back, but results are pending. He also had an MRI for his left knee, but has not yet received the results. The patient also reports ongoing neck pain.  The patient has been managing his pain with a regimen of five hydrocodone tablets per day, which he reports only takes the edge off the pain. He is careful with his movements to avoid exacerbating the pain.  The patient also reports difficulty swallowing, describing food as getting stuck in his throat and going down very slowly. This issue has persisted despite a recent procedure to stretch his esophagus. He is currently on Nexium, but reports no significant improvement in his symptoms.  The patient also mentions a recent flu shot and upcoming comprehensive blood work. He is currently managing hyperlipidemia with atorvastatin and has a history of gout. He is due for prostate screening and will have a uric acid level check for his gout.    This patient was seen today for chronic pain  The medication list was reviewed and updated.   Location of Pain for which the patient has been treated with regarding narcotics: Lumbar pain, left knee pain, cervical pain  Onset of this pain: Present for years   -Compliance with medication: Good compliance  - Number patient states they take daily: 5/day  -Reason for ongoing use of opioids pain discomfort from previous surgeries with failed resolution of his nerve impingement as well as chronic left knee pain since knee surgery  What other measures have been tried outside of opioids Tylenol NSAIDs injections  surgery  In the ongoing specialists regarding this condition orthopedic specialist has seen back specialist previously  -when was the last dose patient took?  Earlier today  The patient was advised the importance of maintaining medication and not using illegal substances with these.  Here for refills and follow up  The patient was educated that we can provide 3 monthly scripts for their medication, it is their responsibility to follow the instructions.  Side effects or complications from medications: Denies side effects  Patient is aware that pain medications are meant to minimize the severity of the pain to allow their pain levels to improve to allow for better function. They are aware of that pain medications cannot totally remove their pain.  Due for UDT ( at least once per year) (pain management contract is also completed at the time of the UDT): September 2024  Scale of 1 to 10 ( 1 is least 10 is most) Your pain level without the medicine: 9 Your pain level with medication 7  Scale 1 to 10 ( 1-helps very little, 10 helps very well) How well does your pain medication reduce your pain so you can function better through out the day?  Throbbing  Quality of the pain: Throbbing aching  Persistence of the pain: Present all the time  Modifying factors: Worse with activity          Review of Systems     Objective:    Physical Exam   VITALS: BP- 118/78 CHEST: Lungs clear to auscultation. CARDIOVASCULAR: Heart  sounds normal.     Decreased range of motion neck and back decreased range of motion left knee      Assessment & Plan:  Assessment and Plan    Esophageal Dysphagia Difficulty swallowing with food getting stuck. Recent esophageal dilation with limited improvement. Nexium trial with limited improvement. -Schedule follow-up with gastroenterologist to discuss persistent symptoms and potential need for repeat dilation.  Chronic Knee Pain Recent MRI, awaiting  results and consultation with orthopedic specialist. -Follow-up with orthopedic specialist for MRI results and treatment plan.  Chronic Low Back Pain Recent MRI, awaiting consultation with new specialist, Dr. Shon Baton. -Follow-up with Dr. Shon Baton for MRI results and treatment plan.  Chronic Pain Management Hydrocodone use up to 5 times per day for pain control. -Continue Hydrocodone as prescribed, no more than 5 per day. -Renew prescription for 3 months.  Hyperlipidemia Currently on Atorvastatin. -Continue Atorvastatin and maintain a healthy diet. -Check lipid profile.  Gout History No current flare reported. -Check uric acid level.  General Health Maintenance -Administer influenza vaccine today. -Order comprehensive blood work including PSA for prostate screening. -Schedule follow-up visit in early to mid-March 2025.     Labs ordered patient will do in the near future  The patient was seen in followup for chronic pain. A review over at their current pain status was discussed. Drug registry was checked. Prescriptions were given.  Regular follow-up recommended. Discussion was held regarding the importance of compliance with medication as well as pain medication contract.  Patient was informed that medication may cause drowsiness and should not be combined  with other medications/alcohol or street drugs. If the patient feels medication is causing altered alertness then do not drive or operate dangerous equipment.  Should be noted that the patient appears to be meeting appropriate use of opioids and response.  Evidenced by improved function and decent pain control without significant side effects and no evidence of overt aberrancy issues.  Upon discussion with the patient today they understand that opioid therapy is optional and they feel that the pain has been refractory to reasonable conservative measures and is significant and affecting quality of life enough to warrant ongoing  therapy and wishes to continue opioids.  Refills were provided.  [facility] medical Board guidelines regarding the pain medicine has been reviewed.  CDC guidelines most updated 2022 has been reviewed by the prescriber.  PDMP is checked on a regular basis yearly urine drug screen and pain management contract

## 2023-07-09 ENCOUNTER — Other Ambulatory Visit (HOSPITAL_COMMUNITY): Payer: Self-pay

## 2023-07-20 ENCOUNTER — Other Ambulatory Visit (HOSPITAL_COMMUNITY): Payer: Self-pay

## 2023-07-20 DIAGNOSIS — M25562 Pain in left knee: Secondary | ICD-10-CM | POA: Diagnosis not present

## 2023-07-21 ENCOUNTER — Other Ambulatory Visit (HOSPITAL_COMMUNITY): Payer: Self-pay

## 2023-07-21 DIAGNOSIS — M5451 Vertebrogenic low back pain: Secondary | ICD-10-CM | POA: Diagnosis not present

## 2023-07-27 ENCOUNTER — Other Ambulatory Visit (HOSPITAL_COMMUNITY): Payer: Self-pay

## 2023-07-30 ENCOUNTER — Other Ambulatory Visit (HOSPITAL_COMMUNITY): Payer: Self-pay

## 2023-07-31 ENCOUNTER — Other Ambulatory Visit (HOSPITAL_COMMUNITY): Payer: Self-pay

## 2023-08-11 ENCOUNTER — Other Ambulatory Visit (HOSPITAL_BASED_OUTPATIENT_CLINIC_OR_DEPARTMENT_OTHER): Payer: Self-pay

## 2023-08-11 ENCOUNTER — Other Ambulatory Visit (HOSPITAL_COMMUNITY): Payer: Self-pay

## 2023-08-11 DIAGNOSIS — M48061 Spinal stenosis, lumbar region without neurogenic claudication: Secondary | ICD-10-CM | POA: Diagnosis not present

## 2023-08-11 MED ORDER — CYCLOBENZAPRINE HCL 5 MG PO TABS
5.0000 mg | ORAL_TABLET | Freq: Two times a day (BID) | ORAL | 0 refills | Status: DC | PRN
  Filled 2023-09-05: qty 30, 15d supply, fill #0

## 2023-08-11 MED ORDER — CYCLOBENZAPRINE HCL 5 MG PO TABS
5.0000 mg | ORAL_TABLET | Freq: Two times a day (BID) | ORAL | 0 refills | Status: DC | PRN
  Filled 2023-08-11: qty 30, 15d supply, fill #0

## 2023-08-19 DIAGNOSIS — M1000 Idiopathic gout, unspecified site: Secondary | ICD-10-CM | POA: Diagnosis not present

## 2023-08-19 DIAGNOSIS — E7849 Other hyperlipidemia: Secondary | ICD-10-CM | POA: Diagnosis not present

## 2023-08-19 DIAGNOSIS — Z125 Encounter for screening for malignant neoplasm of prostate: Secondary | ICD-10-CM | POA: Diagnosis not present

## 2023-08-19 DIAGNOSIS — Z79899 Other long term (current) drug therapy: Secondary | ICD-10-CM | POA: Diagnosis not present

## 2023-08-19 DIAGNOSIS — D72829 Elevated white blood cell count, unspecified: Secondary | ICD-10-CM | POA: Diagnosis not present

## 2023-08-20 ENCOUNTER — Encounter: Payer: Self-pay | Admitting: Family Medicine

## 2023-08-20 ENCOUNTER — Other Ambulatory Visit: Payer: Self-pay | Admitting: Gastroenterology

## 2023-08-20 ENCOUNTER — Other Ambulatory Visit (HOSPITAL_COMMUNITY): Payer: Self-pay

## 2023-08-20 LAB — PSA: Prostate Specific Ag, Serum: 0.4 ng/mL (ref 0.0–4.0)

## 2023-08-20 LAB — CBC WITH DIFFERENTIAL/PLATELET
Basophils Absolute: 0 10*3/uL (ref 0.0–0.2)
Basos: 0 %
EOS (ABSOLUTE): 0.1 10*3/uL (ref 0.0–0.4)
Eos: 1 %
Hematocrit: 50.4 % (ref 37.5–51.0)
Hemoglobin: 16.4 g/dL (ref 13.0–17.7)
Immature Grans (Abs): 0 10*3/uL (ref 0.0–0.1)
Immature Granulocytes: 0 %
Lymphocytes Absolute: 2.1 10*3/uL (ref 0.7–3.1)
Lymphs: 23 %
MCH: 31.1 pg (ref 26.6–33.0)
MCHC: 32.5 g/dL (ref 31.5–35.7)
MCV: 96 fL (ref 79–97)
Monocytes Absolute: 0.7 10*3/uL (ref 0.1–0.9)
Monocytes: 8 %
Neutrophils Absolute: 6 10*3/uL (ref 1.4–7.0)
Neutrophils: 68 %
Platelets: 267 10*3/uL (ref 150–450)
RBC: 5.27 x10E6/uL (ref 4.14–5.80)
RDW: 13.1 % (ref 11.6–15.4)
WBC: 9 10*3/uL (ref 3.4–10.8)

## 2023-08-20 LAB — LIPID PANEL
Chol/HDL Ratio: 3.4 {ratio} (ref 0.0–5.0)
Cholesterol, Total: 191 mg/dL (ref 100–199)
HDL: 57 mg/dL (ref >39)
LDL Chol Calc (NIH): 111 mg/dL — ABNORMAL HIGH (ref 0–99)
Triglycerides: 131 mg/dL (ref 0–149)
VLDL Cholesterol Cal: 23 mg/dL (ref 5–40)

## 2023-08-20 LAB — BASIC METABOLIC PANEL
BUN/Creatinine Ratio: 13 (ref 10–24)
BUN: 15 mg/dL (ref 8–27)
CO2: 24 mmol/L (ref 20–29)
Calcium: 9.8 mg/dL (ref 8.6–10.2)
Chloride: 103 mmol/L (ref 96–106)
Creatinine, Ser: 1.17 mg/dL (ref 0.76–1.27)
Glucose: 92 mg/dL (ref 70–99)
Potassium: 4.9 mmol/L (ref 3.5–5.2)
Sodium: 143 mmol/L (ref 134–144)
eGFR: 71 mL/min/{1.73_m2} (ref >59)

## 2023-08-20 LAB — HEPATIC FUNCTION PANEL
ALT: 21 [IU]/L (ref 0–44)
AST: 17 [IU]/L (ref 0–40)
Albumin: 4.4 g/dL (ref 3.9–4.9)
Alkaline Phosphatase: 124 [IU]/L — ABNORMAL HIGH (ref 44–121)
Bilirubin Total: 0.3 mg/dL (ref 0.0–1.2)
Bilirubin, Direct: 0.11 mg/dL (ref 0.00–0.40)
Total Protein: 7 g/dL (ref 6.0–8.5)

## 2023-08-20 LAB — URIC ACID: Uric Acid: 5.2 mg/dL (ref 3.8–8.4)

## 2023-08-20 MED ORDER — ESOMEPRAZOLE MAGNESIUM 40 MG PO CPDR
40.0000 mg | DELAYED_RELEASE_CAPSULE | Freq: Two times a day (BID) | ORAL | 2 refills | Status: DC
  Filled 2023-08-20 – 2023-08-28 (×2): qty 60, 30d supply, fill #0
  Filled 2023-10-01: qty 60, 30d supply, fill #1
  Filled 2023-10-30: qty 60, 30d supply, fill #2

## 2023-08-20 MED ORDER — CYCLOBENZAPRINE HCL 5 MG PO TABS
5.0000 mg | ORAL_TABLET | Freq: Two times a day (BID) | ORAL | 0 refills | Status: DC | PRN
  Filled 2023-08-20 – 2023-08-26 (×2): qty 30, 15d supply, fill #0

## 2023-08-26 ENCOUNTER — Other Ambulatory Visit (HOSPITAL_COMMUNITY): Payer: Self-pay

## 2023-08-28 ENCOUNTER — Other Ambulatory Visit (HOSPITAL_COMMUNITY): Payer: Self-pay

## 2023-09-05 ENCOUNTER — Other Ambulatory Visit (HOSPITAL_COMMUNITY): Payer: Self-pay

## 2023-09-07 ENCOUNTER — Other Ambulatory Visit (HOSPITAL_COMMUNITY): Payer: Self-pay

## 2023-09-07 MED ORDER — AMOXICILLIN 250 MG PO CAPS
250.0000 mg | ORAL_CAPSULE | Freq: Three times a day (TID) | ORAL | 0 refills | Status: DC
  Filled 2023-09-07: qty 15, 5d supply, fill #0

## 2023-09-14 DIAGNOSIS — M1712 Unilateral primary osteoarthritis, left knee: Secondary | ICD-10-CM | POA: Diagnosis not present

## 2023-09-19 ENCOUNTER — Other Ambulatory Visit (HOSPITAL_COMMUNITY): Payer: Self-pay

## 2023-09-21 ENCOUNTER — Other Ambulatory Visit (HOSPITAL_COMMUNITY): Payer: Self-pay

## 2023-09-22 ENCOUNTER — Other Ambulatory Visit (HOSPITAL_COMMUNITY): Payer: Self-pay

## 2023-09-25 ENCOUNTER — Other Ambulatory Visit (HOSPITAL_COMMUNITY): Payer: Self-pay

## 2023-10-01 ENCOUNTER — Other Ambulatory Visit: Payer: Self-pay | Admitting: Family Medicine

## 2023-10-01 ENCOUNTER — Other Ambulatory Visit (HOSPITAL_COMMUNITY): Payer: Self-pay

## 2023-10-01 MED ORDER — ALLOPURINOL 300 MG PO TABS
300.0000 mg | ORAL_TABLET | Freq: Every day | ORAL | 1 refills | Status: DC
  Filled 2023-10-01: qty 90, fill #0
  Filled 2023-10-14: qty 30, 30d supply, fill #0
  Filled 2023-12-07: qty 30, 30d supply, fill #1
  Filled 2024-01-09: qty 30, 30d supply, fill #2
  Filled 2024-02-11: qty 30, 30d supply, fill #3
  Filled 2024-03-14: qty 30, 30d supply, fill #4
  Filled 2024-04-10: qty 30, 30d supply, fill #5

## 2023-10-01 MED ORDER — ATORVASTATIN CALCIUM 20 MG PO TABS
20.0000 mg | ORAL_TABLET | Freq: Every evening | ORAL | 1 refills | Status: DC
  Filled 2023-10-01: qty 90, fill #0

## 2023-10-02 ENCOUNTER — Other Ambulatory Visit (HOSPITAL_COMMUNITY): Payer: Self-pay

## 2023-10-13 ENCOUNTER — Other Ambulatory Visit: Payer: Self-pay

## 2023-10-13 ENCOUNTER — Ambulatory Visit (INDEPENDENT_AMBULATORY_CARE_PROVIDER_SITE_OTHER): Payer: 59 | Admitting: Family Medicine

## 2023-10-13 ENCOUNTER — Other Ambulatory Visit (HOSPITAL_COMMUNITY): Payer: Self-pay

## 2023-10-13 VITALS — BP 128/78 | Ht 71.0 in

## 2023-10-13 DIAGNOSIS — Z79891 Long term (current) use of opiate analgesic: Secondary | ICD-10-CM | POA: Diagnosis not present

## 2023-10-13 DIAGNOSIS — E7849 Other hyperlipidemia: Secondary | ICD-10-CM

## 2023-10-13 DIAGNOSIS — M1000 Idiopathic gout, unspecified site: Secondary | ICD-10-CM | POA: Diagnosis not present

## 2023-10-13 DIAGNOSIS — M5442 Lumbago with sciatica, left side: Secondary | ICD-10-CM

## 2023-10-13 DIAGNOSIS — M25562 Pain in left knee: Secondary | ICD-10-CM | POA: Diagnosis not present

## 2023-10-13 DIAGNOSIS — M4722 Other spondylosis with radiculopathy, cervical region: Secondary | ICD-10-CM | POA: Diagnosis not present

## 2023-10-13 DIAGNOSIS — G8929 Other chronic pain: Secondary | ICD-10-CM

## 2023-10-13 DIAGNOSIS — M5441 Lumbago with sciatica, right side: Secondary | ICD-10-CM

## 2023-10-13 MED ORDER — HYDROCODONE-ACETAMINOPHEN 10-325 MG PO TABS
1.0000 | ORAL_TABLET | ORAL | 0 refills | Status: DC | PRN
  Filled 2023-10-13: qty 150, 25d supply, fill #0
  Filled 2023-10-30: qty 150, 30d supply, fill #0

## 2023-10-13 MED ORDER — HYDROCODONE-ACETAMINOPHEN 10-325 MG PO TABS
1.0000 | ORAL_TABLET | ORAL | 0 refills | Status: DC | PRN
  Filled 2023-10-13: qty 150, 25d supply, fill #0
  Filled 2024-01-09: qty 150, 30d supply, fill #0

## 2023-10-13 MED ORDER — HYDROCODONE-ACETAMINOPHEN 10-325 MG PO TABS
1.0000 | ORAL_TABLET | ORAL | 0 refills | Status: DC
  Filled 2023-10-13 – 2023-10-30 (×2): qty 150, 25d supply, fill #0
  Filled 2023-12-07: qty 150, 30d supply, fill #0

## 2023-10-13 MED ORDER — ATORVASTATIN CALCIUM 40 MG PO TABS
40.0000 mg | ORAL_TABLET | Freq: Every evening | ORAL | 1 refills | Status: DC
Start: 2023-10-13 — End: 2024-05-12
  Filled 2023-10-13: qty 30, 30d supply, fill #0
  Filled 2023-12-07: qty 30, 30d supply, fill #1
  Filled 2024-01-09: qty 30, 30d supply, fill #2
  Filled 2024-02-11: qty 30, 30d supply, fill #3
  Filled 2024-03-14: qty 30, 30d supply, fill #4
  Filled 2024-04-10: qty 30, 30d supply, fill #5

## 2023-10-13 NOTE — Progress Notes (Signed)
 Subjective:    Patient ID: Marcus Hayes, male    DOB: 1961/04/18, 64 y.o.   MRN: [id number]  Discussed the use of AI scribe software for clinical note transcription with the patient, who gave verbal consent to proceed. This patient was seen today for chronic pain  The medication list was reviewed and updated.   Location of Pain for which the patient has been treated with regarding narcotics: Left knee, lumbar back, cervical neck  Onset of this pain: Present for years   -Compliance with medication: Good compliance  - Number patient states they take daily: Some days 4 tablets some days 5  -Reason for ongoing use of opioids unable to get adequate relief with Tylenol NSAIDs  What other measures have been tried outside of opioids surgery, physical therapy, Tylenol, NSAIDs  In the ongoing specialists regarding this condition periodically orthopedist and back specialist  -when was the last dose patient took?  Within the past 24 hours  The patient was advised the importance of maintaining medication and not using illegal substances with these.  Here for refills and follow up  The patient was educated that we can provide 3 monthly scripts for their medication, it is their responsibility to follow the instructions.  Side effects or complications from medications: No side effects  Patient is aware that pain medications are meant to minimize the severity of the pain to allow their pain levels to improve to allow for better function. They are aware of that pain medications cannot totally remove their pain.  Due for UDT ( at least once per year) (pain management contract is also completed at the time of the UDT): Up-to-date Patient brought in pill bottles it was counted count was legitimate Scale of 1 to 10 ( 1 is least 10 is most) Your pain level without the medicine: 8 Your pain level with medication 5  Scale 1 to 10 ( 1-helps very little, 10 helps very well) How well does your  pain medication reduce your pain so you can function better through out the day?  6  Quality of the pain: Throbbing aching  Persistence of the pain: Present all the time  Modifying factors: Worse with activity      History of Present Illness   The patient presents with knee and back pain management and follow-up on recent lab work.  He has ongoing knee pain, partially managed with injections that provide some relief, especially at night, aiding in sleep. However, the relief is incomplete, and he is considering further treatment options later in the year.  He experiences back pain, managed with hydrocodone, averaging five tablets a day, which helps alleviate his symptoms.  Recent lab work from January 15th showed a uric acid level of 5.2, below the target of 6 for managing gout. His PSA level was 0.4, liver enzymes were normal, and cholesterol levels showed slightly elevated LDL at 111, with HDL at 57.  He plans to start a dietary supplement regimen at the end of the month, which his daughter has been using successfully for weight management and reducing inflammation. The regimen involves a series of powder mixes taken throughout the day.  Socially, he is a caregiver and finds the role stressful. He engages in activities like fishing to manage stress and maintain personal time.         Review of Systems     Objective:    Physical Exam   VITALS: BP- 128/78 CHEST: Lungs clear to auscultation bilaterally. CARDIOVASCULAR:  Heart sounds normal.     General-in no acute distress Eyes-no discharge Lungs-respiratory rate normal, CTA CV-no murmurs,RRR Extremities skin warm dry no edema Neuro grossly normal Behavior normal, alert       Assessment & Plan:  Assessment and Plan    Knee Pain Chronic knee pain managed with injections, providing relief. He prefers to delay further treatment until late fall or winter. - Coordinate with specialists for potential knee treatment in late  fall or winter.  Chronic Pain Management Chronic pain managed with hydrocodone, averaging 5 tablets per day. Dosage within acceptable limits. - Continue current hydrocodone regimen. - Ensure pain medication is stored securely.  Gout Uric acid level controlled at 5.2, indicating effective management.  Hyperlipidemia Mildly elevated LDL cholesterol at 111 mg/dL. Adjusting medication recommended to lower LDL levels. - Adjust atorvastatin dosage to lower LDL cholesterol levels.  General Health Maintenance Overall lab work normal. Considering dietary supplement for weight management. - Encourage healthy eating with vegetables, fruits, and lean meats. - Minimize fast foods and junk food. - Support plan to try a dietary supplement for weight management.      1. Encounter for long-term opiate analgesic use The patient was seen in followup for chronic pain. A review over at their current pain status was discussed. Drug registry was checked. Prescriptions were given.  Regular follow-up recommended. Discussion was held regarding the importance of compliance with medication as well as pain medication contract.  Patient was informed that medication may cause drowsiness and should not be combined  with other medications/alcohol or street drugs. If the patient feels medication is causing altered alertness then do not drive or operate dangerous equipment.  Should be noted that the patient appears to be meeting appropriate use of opioids and response.  Evidenced by improved function and decent pain control without significant side effects and no evidence of overt aberrancy issues.  Upon discussion with the patient today they understand that opioid therapy is optional and they feel that the pain has been refractory to reasonable conservative measures and is significant and affecting quality of life enough to warrant ongoing therapy and wishes to continue opioids.  Refills were provided.  [facility]  medical Board guidelines regarding the pain medicine has been reviewed.  CDC guidelines most updated 2022 has been reviewed by the prescriber.  PDMP is checked on a regular basis yearly urine drug screen and pain management contract  Treatment plan for this patient includes #1-gentle stretching exercises as shown daily basis 2.  Mild strength exercises 3 times per week #3 continue pain medications #4 notify us if any digression  - HYDROcodone-acetaminophen (NORCO) 10-325 MG tablet; Take 1 tablet by mouth every 4 hours as needed for pain (maximum 5 tablets per day)  Dispense: 150 tablet; Refill: 0  2. Other hyperlipidemia (Primary) Continue statin healthy diet get LDL below 70 if possible statin adjusted  3. Chronic pain of left knee Gentle exercises, pain medicine when necessary, see specialist when necessary  4. Chronic low back pain with bilateral sciatica, unspecified back pain laterality Gentle stretching, use good body mechanics, continue medication  5. Idiopathic gout, unspecified chronicity, unspecified site Continue allopurinol no flareups lately  6. Cervical spondylosis with radiculopathy Gentle range of motion exercises, pain medicine when necessary  Follow-up in 3 months

## 2023-10-14 ENCOUNTER — Other Ambulatory Visit (HOSPITAL_COMMUNITY): Payer: Self-pay

## 2023-10-30 ENCOUNTER — Other Ambulatory Visit (HOSPITAL_COMMUNITY): Payer: Self-pay

## 2023-11-02 ENCOUNTER — Other Ambulatory Visit (HOSPITAL_COMMUNITY): Payer: Self-pay

## 2023-11-02 ENCOUNTER — Other Ambulatory Visit: Payer: Self-pay

## 2023-11-02 DIAGNOSIS — M1712 Unilateral primary osteoarthritis, left knee: Secondary | ICD-10-CM | POA: Diagnosis not present

## 2023-11-02 MED ORDER — CYCLOBENZAPRINE HCL 5 MG PO TABS
5.0000 mg | ORAL_TABLET | Freq: Every day | ORAL | 0 refills | Status: DC | PRN
  Filled 2023-11-02: qty 30, 30d supply, fill #0

## 2023-11-02 MED ORDER — LIDOCAINE 4 % EX PTCH
MEDICATED_PATCH | CUTANEOUS | 2 refills | Status: DC
  Filled 2023-11-02: qty 30, 30d supply, fill #0

## 2023-11-05 ENCOUNTER — Other Ambulatory Visit (HOSPITAL_COMMUNITY): Payer: Self-pay

## 2023-11-09 DIAGNOSIS — M47816 Spondylosis without myelopathy or radiculopathy, lumbar region: Secondary | ICD-10-CM | POA: Diagnosis not present

## 2023-11-09 DIAGNOSIS — M48061 Spinal stenosis, lumbar region without neurogenic claudication: Secondary | ICD-10-CM | POA: Diagnosis not present

## 2023-11-12 DIAGNOSIS — M47816 Spondylosis without myelopathy or radiculopathy, lumbar region: Secondary | ICD-10-CM | POA: Diagnosis not present

## 2023-12-07 ENCOUNTER — Other Ambulatory Visit (HOSPITAL_COMMUNITY): Payer: Self-pay

## 2023-12-31 ENCOUNTER — Other Ambulatory Visit (HOSPITAL_COMMUNITY): Payer: Self-pay

## 2023-12-31 DIAGNOSIS — M5416 Radiculopathy, lumbar region: Secondary | ICD-10-CM | POA: Diagnosis not present

## 2023-12-31 DIAGNOSIS — M47816 Spondylosis without myelopathy or radiculopathy, lumbar region: Secondary | ICD-10-CM | POA: Diagnosis not present

## 2023-12-31 DIAGNOSIS — M48061 Spinal stenosis, lumbar region without neurogenic claudication: Secondary | ICD-10-CM | POA: Diagnosis not present

## 2024-01-09 ENCOUNTER — Other Ambulatory Visit (HOSPITAL_COMMUNITY): Payer: Self-pay

## 2024-01-13 ENCOUNTER — Ambulatory Visit: Admitting: Family Medicine

## 2024-02-11 ENCOUNTER — Other Ambulatory Visit (HOSPITAL_COMMUNITY): Payer: Self-pay

## 2024-02-11 ENCOUNTER — Other Ambulatory Visit: Payer: Self-pay | Admitting: Family Medicine

## 2024-02-12 ENCOUNTER — Other Ambulatory Visit (HOSPITAL_COMMUNITY): Payer: Self-pay

## 2024-02-14 MED ORDER — HYDROCODONE-ACETAMINOPHEN 10-325 MG PO TABS
1.0000 | ORAL_TABLET | ORAL | 0 refills | Status: DC | PRN
  Filled 2024-02-14: qty 150, 30d supply, fill #0

## 2024-02-15 ENCOUNTER — Other Ambulatory Visit (HOSPITAL_COMMUNITY): Payer: Self-pay

## 2024-03-02 ENCOUNTER — Ambulatory Visit (INDEPENDENT_AMBULATORY_CARE_PROVIDER_SITE_OTHER): Admitting: Family Medicine

## 2024-03-02 ENCOUNTER — Other Ambulatory Visit (HOSPITAL_COMMUNITY): Payer: Self-pay

## 2024-03-02 ENCOUNTER — Encounter: Payer: Self-pay | Admitting: Family Medicine

## 2024-03-02 VITALS — BP 119/82 | HR 90 | Temp 97.9°F | Ht 71.0 in | Wt 247.0 lb

## 2024-03-02 DIAGNOSIS — E7849 Other hyperlipidemia: Secondary | ICD-10-CM

## 2024-03-02 DIAGNOSIS — Z79891 Long term (current) use of opiate analgesic: Secondary | ICD-10-CM

## 2024-03-02 DIAGNOSIS — M25551 Pain in right hip: Secondary | ICD-10-CM

## 2024-03-02 DIAGNOSIS — M25552 Pain in left hip: Secondary | ICD-10-CM | POA: Diagnosis not present

## 2024-03-02 MED ORDER — HYDROCODONE-ACETAMINOPHEN 10-325 MG PO TABS
1.0000 | ORAL_TABLET | ORAL | 0 refills | Status: DC | PRN
  Filled 2024-03-02: qty 150, 25d supply, fill #0
  Filled 2024-04-14: qty 150, 30d supply, fill #0

## 2024-03-02 MED ORDER — HYDROCODONE-ACETAMINOPHEN 10-325 MG PO TABS
1.0000 | ORAL_TABLET | ORAL | 0 refills | Status: DC | PRN
  Filled 2024-03-02: qty 150, 25d supply, fill #0
  Filled 2024-05-12: qty 150, 30d supply, fill #0

## 2024-03-02 MED ORDER — HYDROCODONE-ACETAMINOPHEN 10-325 MG PO TABS
1.0000 | ORAL_TABLET | ORAL | 0 refills | Status: DC
  Filled 2024-03-02: qty 150, 25d supply, fill #0
  Filled 2024-03-14: qty 150, 30d supply, fill #0

## 2024-03-02 MED ORDER — PREGABALIN 25 MG PO CAPS
25.0000 mg | ORAL_CAPSULE | Freq: Two times a day (BID) | ORAL | 5 refills | Status: DC
Start: 2024-03-02 — End: 2024-06-02
  Filled 2024-03-02 – 2024-03-24 (×3): qty 60, 30d supply, fill #0
  Filled 2024-05-13: qty 60, 30d supply, fill #1

## 2024-03-02 NOTE — Progress Notes (Signed)
 Subjective:    Patient ID: Marcus Hayes, male    DOB: 1960-08-12, 63 y.o.   MRN: [id number]  HPI This patient was seen today for chronic pain  The medication list was reviewed and updated.   Location of Pain for which the patient has been treated with regarding narcotics: neck, back, knee and hip  Onset of this pain: Present for years   -Compliance with medication: Good compliance  - Number patient states they take daily: 5  -Reason for ongoing use of opioids neck, back, knee and hip pain   What other measures have been tried outside of opioids surgery, anti inflammatory, injections, tylenol , PT, home stretches, chiropractor  In the ongoing specialists regarding this condition back specialist  -when was the last dose patient took? today  The patient was advised the importance of maintaining medication and not using illegal substances with these.  Here for refills and follow up  The patient was educated that we can provide 3 monthly scripts for their medication, it is their responsibility to follow the instructions.  Side effects or complications from medications: Denies side effects  Patient is aware that pain medications are meant to minimize the severity of the pain to allow their pain levels to improve to allow for better function. They are aware of that pain medications cannot totally remove their pain.  Due for UDT ( at least once per year) (pain management contract is also completed at the time of the UDT): 04/23/2023  Scale of 1 to 10 ( 1 is least 10 is most) Your pain level without the medicine: 8 Your pain level with medication 5  Scale 1 to 10 ( 1-helps very little, 10 helps very well) How well does your pain medication reduce your pain so you can function better through out the day? 8  Quality of the pain: Throbbing aching  Persistence of the pain: Present all time  Modifying factors: Worse with activity  Bilateral hip pain - Plan: XR HIP UNILAT W  OR W/O PELVIS 2-3 VIEWS RIGHT, XR HIP UNILAT W OR W/O PELVIS 2-3 VIEWS LEFT  Encounter for long-term opiate analgesic use - Plan: HYDROcodone -acetaminophen  (NORCO) 10-325 MG tablet  Other hyperlipidemia  Has not had any gout flareups lately Takes his medicine Keeps reflux under good control His main complaint is increased burning pain and discomfort as well as into the legs He describes his quality of life as being subpar because of the amount of pain he has He has been on same regimen for a span of time He does not recall what gabapentin  did or did not do in the past       Review of Systems     Objective:   Physical Exam General-in no acute distress Eyes-no discharge Lungs-respiratory rate normal, CTA CV-no murmurs,RRR Extremities skin warm dry no edema Neuro grossly normal Behavior normal, alert Subjective discomfort in the low back subjective discomfort bilateral buttocks and hips with flexion rotation Abdomen soft       Assessment & Plan:  1. Encounter for long-term opiate analgesic use The patient was seen in followup for chronic pain. A review over at their current pain status was discussed. Drug registry was checked. Prescriptions were given.  Regular follow-up recommended. Discussion was held regarding the importance of compliance with medication as well as pain medication contract.  Patient was informed that medication may cause drowsiness and should not be combined  with other medications/alcohol or street drugs. If the patient feels medication  is causing altered alertness then do not drive or operate dangerous equipment.  Should be noted that the patient appears to be meeting appropriate use of opioids and response.  Evidenced by improved function and decent pain control without significant side effects and no evidence of overt aberrancy issues.  Upon discussion with the patient today they understand that opioid therapy is optional and they feel that the pain  has been refractory to reasonable conservative measures and is significant and affecting quality of life enough to warrant ongoing therapy and wishes to continue opioids.  Refills were provided.  [place]  medical Board guidelines regarding the pain medicine has been reviewed.  CDC guidelines most updated 2022 has been reviewed by the prescriber.  PDMP is checked on a regular basis yearly urine drug screen and pain management contract  Treatment plan for this patient includes #1-gentle stretching exercises as shown daily basis 2.  Mild strength exercises 3 times per week #3 continue pain medications #4 notify us  if any digression  - HYDROcodone -acetaminophen  (NORCO) 10-325 MG tablet; Take 1 tablet by mouth every 4 hours as needed for pain (maximum 5 tablets per day)  Dispense: 150 tablet; Refill: 0  2. Bilateral hip pain (Primary) X-rays ordered await results - XR HIP UNILAT W OR W/O PELVIS 2-3 VIEWS RIGHT - XR HIP UNILAT W OR W/O PELVIS 2-3 VIEWS LEFT  3. Other hyperlipidemia Healthy diet Comprehensive lab work later this year Follow-up in 3 months 25 minutes spent with patient discussing his back pain buttock pain and discussing rationale behind trying Lyrica  patient agrees patient will give us  feedback on if that is helping or not helping He was also told if it causes drowsiness do not drive with the medicine And if it causes drowsiness stop the medicine Patient to give us  feedback within 2 weeks how the medicine is doing

## 2024-03-08 ENCOUNTER — Other Ambulatory Visit (HOSPITAL_COMMUNITY): Payer: Self-pay

## 2024-03-14 ENCOUNTER — Other Ambulatory Visit: Payer: Self-pay | Admitting: Gastroenterology

## 2024-03-14 ENCOUNTER — Other Ambulatory Visit (HOSPITAL_COMMUNITY): Payer: Self-pay

## 2024-03-15 ENCOUNTER — Other Ambulatory Visit (HOSPITAL_BASED_OUTPATIENT_CLINIC_OR_DEPARTMENT_OTHER): Payer: Self-pay

## 2024-03-15 ENCOUNTER — Other Ambulatory Visit (HOSPITAL_COMMUNITY): Payer: Self-pay

## 2024-03-15 MED ORDER — ESOMEPRAZOLE MAGNESIUM 40 MG PO CPDR
40.0000 mg | DELAYED_RELEASE_CAPSULE | Freq: Two times a day (BID) | ORAL | 1 refills | Status: DC
  Filled 2024-03-15 – 2024-05-12 (×2): qty 60, 30d supply, fill #0
  Filled 2024-06-14: qty 60, 30d supply, fill #1

## 2024-03-22 ENCOUNTER — Telehealth: Payer: Self-pay | Admitting: Family Medicine

## 2024-03-22 NOTE — Telephone Encounter (Signed)
 Left a message for a return call all additional information regarding medication names and other needed information.

## 2024-03-22 NOTE — Telephone Encounter (Signed)
     Copied from CRM [id number]. Topic: Clinical - Medication Question >> Mar 22, 2024 11:02 AM Willma R wrote: Reason for CRM: Patient is filling out disability forms and he needs to know when his medications were first prescribed.   Patient can be reached at [phone]

## 2024-03-22 NOTE — Telephone Encounter (Signed)
 Nurses-please touch base with patient.  Please have patient try to clarify regarding medications when they were started?  Is he asking when he started on pain medicine such as hydrocodone ?

## 2024-03-23 NOTE — Telephone Encounter (Signed)
Information given via mychart.

## 2024-03-24 ENCOUNTER — Other Ambulatory Visit (HOSPITAL_COMMUNITY): Payer: Self-pay

## 2024-03-24 ENCOUNTER — Telehealth (HOSPITAL_COMMUNITY): Payer: Self-pay

## 2024-03-24 NOTE — Telephone Encounter (Signed)
 Pharmacy Patient Advocate Encounter   Received notification from Pt Calls Messages that prior authorization for Pregabalin  25MG  capsules  is required/requested.   Insurance verification completed.   The patient is insured through [employer] .   Per test claim: PA required; PA submitted to above mentioned insurance via Latent Key/confirmation #/EOC [id number] Status is pending

## 2024-03-24 NOTE — Telephone Encounter (Signed)
 PA request has been Received. New Encounter has been or will be created for follow up. For additional info see Pharmacy Prior Auth telephone encounter from 03/24/24.

## 2024-03-25 ENCOUNTER — Other Ambulatory Visit (HOSPITAL_COMMUNITY): Payer: Self-pay

## 2024-03-25 NOTE — Telephone Encounter (Signed)
 Pharmacy Patient Advocate Encounter  Received notification from [employer] that Prior Authorization for  Pregabalin  25MG  capsules  has been APPROVED from 03/24/24 to 03/24/25. Ran test claim, Copay is $3. This test claim was processed through [facility]- copay amounts may vary at other pharmacies due to pharmacy/plan contracts, or as the patient moves through the different stages of their insurance plan.   PA #/Case ID/Reference #: [id number]

## 2024-03-31 ENCOUNTER — Other Ambulatory Visit (HOSPITAL_COMMUNITY): Payer: Self-pay

## 2024-04-07 ENCOUNTER — Ambulatory Visit (HOSPITAL_COMMUNITY)
Admission: RE | Admit: 2024-04-07 | Discharge: 2024-04-07 | Disposition: A | Discharge status: Home / Self Care | Payer: Self-pay | Source: Ambulatory Visit | Attending: Family Medicine | Admitting: Family Medicine

## 2024-04-07 ENCOUNTER — Ambulatory Visit: Payer: Self-pay

## 2024-04-07 ENCOUNTER — Other Ambulatory Visit: Payer: Self-pay | Admitting: Family Medicine

## 2024-04-07 DIAGNOSIS — M25551 Pain in right hip: Secondary | ICD-10-CM | POA: Insufficient documentation

## 2024-04-07 DIAGNOSIS — M25552 Pain in left hip: Secondary | ICD-10-CM | POA: Insufficient documentation

## 2024-04-10 ENCOUNTER — Other Ambulatory Visit (HOSPITAL_COMMUNITY): Payer: Self-pay

## 2024-04-14 ENCOUNTER — Other Ambulatory Visit (HOSPITAL_COMMUNITY): Payer: Self-pay

## 2024-04-18 ENCOUNTER — Ambulatory Visit: Payer: Self-pay | Admitting: Family Medicine

## 2024-05-12 ENCOUNTER — Other Ambulatory Visit: Payer: Self-pay | Admitting: Family Medicine

## 2024-05-12 ENCOUNTER — Other Ambulatory Visit (HOSPITAL_COMMUNITY): Payer: Self-pay

## 2024-05-12 MED ORDER — ATORVASTATIN CALCIUM 40 MG PO TABS
40.0000 mg | ORAL_TABLET | Freq: Every evening | ORAL | 1 refills | Status: DC
  Filled 2024-05-12: qty 90, 90d supply, fill #0

## 2024-05-12 MED ORDER — ALLOPURINOL 300 MG PO TABS
300.0000 mg | ORAL_TABLET | Freq: Every day | ORAL | 1 refills | Status: DC
  Filled 2024-05-12: qty 90, 90d supply, fill #0

## 2024-05-13 ENCOUNTER — Other Ambulatory Visit (HOSPITAL_COMMUNITY): Payer: Self-pay

## 2024-06-02 ENCOUNTER — Other Ambulatory Visit (HOSPITAL_COMMUNITY): Payer: Self-pay

## 2024-06-02 ENCOUNTER — Encounter: Payer: Self-pay | Admitting: Family Medicine

## 2024-06-02 ENCOUNTER — Ambulatory Visit: Payer: Self-pay | Admitting: Family Medicine

## 2024-06-02 VITALS — BP 122/84 | HR 88 | Temp 98.2°F | Ht 71.0 in | Wt 247.8 lb

## 2024-06-02 DIAGNOSIS — M1000 Idiopathic gout, unspecified site: Secondary | ICD-10-CM

## 2024-06-02 DIAGNOSIS — M25511 Pain in right shoulder: Secondary | ICD-10-CM

## 2024-06-02 DIAGNOSIS — D72829 Elevated white blood cell count, unspecified: Secondary | ICD-10-CM

## 2024-06-02 DIAGNOSIS — I10 Essential (primary) hypertension: Secondary | ICD-10-CM

## 2024-06-02 DIAGNOSIS — Z79899 Other long term (current) drug therapy: Secondary | ICD-10-CM

## 2024-06-02 DIAGNOSIS — Z79891 Long term (current) use of opiate analgesic: Secondary | ICD-10-CM

## 2024-06-02 DIAGNOSIS — E7849 Other hyperlipidemia: Secondary | ICD-10-CM

## 2024-06-02 DIAGNOSIS — Z125 Encounter for screening for malignant neoplasm of prostate: Secondary | ICD-10-CM

## 2024-06-02 MED ORDER — ATORVASTATIN CALCIUM 40 MG PO TABS
40.0000 mg | ORAL_TABLET | Freq: Every evening | ORAL | 1 refills | Status: DC
  Filled 2024-06-02 – 2024-08-10 (×2): qty 90, 90d supply, fill #0

## 2024-06-02 MED ORDER — ALLOPURINOL 300 MG PO TABS
300.0000 mg | ORAL_TABLET | Freq: Every day | ORAL | 1 refills | Status: DC
  Filled 2024-06-02 – 2024-08-10 (×2): qty 90, 90d supply, fill #0

## 2024-06-02 MED ORDER — HYDROCODONE-ACETAMINOPHEN 10-325 MG PO TABS
1.0000 | ORAL_TABLET | ORAL | 0 refills | Status: DC
  Filled 2024-06-02 – 2024-08-11 (×2): qty 150, 25d supply, fill #0

## 2024-06-02 MED ORDER — PREGABALIN 50 MG PO CAPS
50.0000 mg | ORAL_CAPSULE | Freq: Two times a day (BID) | ORAL | 5 refills | Status: DC
  Filled 2024-06-02 – 2024-07-11 (×2): qty 60, 30d supply, fill #0
  Filled 2024-08-10: qty 60, 30d supply, fill #1

## 2024-06-02 MED ORDER — HYDROCODONE-ACETAMINOPHEN 10-325 MG PO TABS
1.0000 | ORAL_TABLET | ORAL | 0 refills | Status: DC | PRN
  Filled 2024-06-02: qty 150, 25d supply, fill #0
  Filled 2024-06-14: qty 150, 30d supply, fill #0

## 2024-06-02 MED ORDER — HYDROCODONE-ACETAMINOPHEN 10-325 MG PO TABS
1.0000 | ORAL_TABLET | ORAL | 0 refills | Status: DC | PRN
  Filled 2024-06-02: qty 150, 25d supply, fill #0
  Filled 2024-07-14: qty 150, 30d supply, fill #0

## 2024-06-02 NOTE — Patient Instructions (Signed)

## 2024-06-02 NOTE — Progress Notes (Signed)
 Subjective:    Patient ID: Marcus Hayes, male    DOB: [birth year], 63 y.o.   MRN: [id number]  HPI Patient is in room 7.  Patient is here for a follow up on pain management. Please see pain management questionnaire Patient mentioned his glutes have been in extreme pain.  Patient also mentioned having right shoulder pain, towards to the back shoulder area.  This patient was seen today for chronic pain  The medication list was reviewed and updated.   Location of Pain for which the patient has been treated with regarding narcotics: Right shoulder pain lumbar pain and knee pain  Onset of this pain: Present for years   -Compliance with medication: Good compliance  - Number patient states they take daily: 4 to 5/day  -Reason for ongoing use of opioids does not get relief with surgery previous injections physical therapy medications such as Tylenol  NSAIDs  What other measures have been tried outside of opioids all the above  In the ongoing specialists regarding this condition orthopedic specialist previously back specialist  -when was the last dose patient took?  Past 24 hours  The patient was advised the importance of maintaining medication and not using illegal substances with these.  Here for refills and follow up  The patient was educated that we can provide 3 monthly scripts for their medication, it is their responsibility to follow the instructions.  Side effects or complications from medications: No side effects  Patient is aware that pain medications are meant to minimize the severity of the pain to allow their pain levels to improve to allow for better function. They are aware of that pain medications cannot totally remove their pain.  Due for UDT ( at least once per year) (pain management contract is also completed at the time of the UDT): Due currently  Scale of 1 to 10 ( 1 is least 10 is most) Your pain level without the medicine: 8-chronic Your pain level  with medication 5-7  Scale 1 to 10 ( 1-helps very little, 10 helps very well) How well does your pain medication reduce your pain so you can function better through out the day?  7  Quality of the pain: Throbbing aching  Persistence of the pain: Present all time  Modifying factors: Worse with activity       Patient mentioned not seeing a difference in the Pregablin. Discussed the use of AI scribe software for clinical note transcription with the patient, who gave verbal consent to proceed.  History of Present Illness   Marcus Hayes is a 63 year old male who presents with worsening buttock and leg pain following a back injection.  He experiences severe pain in his buttock and leg, radiating down his leg, following a back injection. The pain is described as almost 'putting him on the ground'. He is taking a nerve medication at a dose of 25 mg twice a day, which has not provided relief. No side effects from the medication have been noted. He has not consulted a specialist for this issue.  He also experiences shoulder pain, described as aching with occasional sharp pain, exacerbated by lifting his arm, such as when driving. He has a history of a broken collarbone in his late twenties or early thirties, which had healed without issues until now. He has not tried any range of motion exercises for his shoulder. He occasionally takes ibuprofen  and extra strength Tylenol  for pain relief.  He is currently taking medications  for cholesterol, gout, and an acid blocker, all of which he takes regularly.  He is planning to move in with his mother, who is experiencing significant vision loss and requires assistance. He will be sharing caregiving responsibilities with his sister. No recent falls have been reported. He has had to adjust his sleeping position due to discomfort.      Review of Systems     Objective:   Physical Exam Positive straight leg raise on both sides Right shoulder  pain discomfort subjective Lungs clear heart regular abdomen soft       Assessment & Plan:  Assessment and Plan    Chronic low back pain with radiculopathy Pain radiates down the leg, severe enough to almost cause falls. Current medication ineffective. - Increase nerve medication dosage to 50 mg twice a day. - Consider referral to a specialist if pain persists.  Right shoulder pain, likely rotator cuff tendinopathy Pain likely due to rotator cuff tendinopathy, worsened by arm lifting. Previous collarbone fracture and tendon repair. - Refer to orthopedist for further evaluation. - Provide range of motion exercises in after visit summary. - Consider x-ray and injections if ibuprofen  is not effective. - Continue ibuprofen  and extra strength Tylenol  for pain management.      1. Encounter for long-term opiate analgesic use The patient was seen in followup for chronic pain. A review over at their current pain status was discussed. Drug registry was checked. Prescriptions were given.  Regular follow-up recommended. Discussion was held regarding the importance of compliance with medication as well as pain medication contract.  Patient was informed that medication may cause drowsiness and should not be combined  with other medications/alcohol or street drugs. If the patient feels medication is causing altered alertness then do not drive or operate dangerous equipment.  Should be noted that the patient appears to be meeting appropriate use of opioids and response.  Evidenced by improved function and decent pain control without significant side effects and no evidence of overt aberrancy issues.  Upon discussion with the patient today they understand that opioid therapy is optional and they feel that the pain has been refractory to reasonable conservative measures and is significant and affecting quality of life enough to warrant ongoing therapy and wishes to continue opioids.  Refills were  provided.  [place]  medical Board guidelines regarding the pain medicine has been reviewed.  CDC guidelines most updated 2022 has been reviewed by the prescriber.  PDMP is checked on a regular basis yearly urine drug screen and pain management contract  Treatment plan for this patient includes #1-gentle stretching exercises as shown daily basis 2.  Mild strength exercises 3 times per week #3 continue pain medications #4 notify us  if any digression  - HYDROcodone -acetaminophen  (NORCO) 10-325 MG tablet; Take 1 tablet by mouth every 4 (four) hours as needed for pain. Maximum 5 tablets per day. Caution drowsiness  Dispense: 150 tablet; Refill: 0 - HYDROcodone -acetaminophen  (NORCO) 10-325 MG tablet; Take 1 tablet by mouth every 4 hours as needed for pain (maximum 5 tablets per day) 08/10/24  Dispense: 150 tablet; Refill: 0 - HYDROcodone -acetaminophen  (NORCO) 10-325 MG tablet; Take 1 tablet by mouth every 4 (four) hours as needed for pain. Maximum 5 tablets per day. Caution drowsiness. 07/11/24  Dispense: 150 tablet; Refill: 0 - Drug Screen, Urine  2. Acute pain of right shoulder (Primary) Referral to orthopedics Dr. Kary[facility]  3. Idiopathic gout, unspecified chronicity, unspecified site Continue current measures  4. Other hyperlipidemia Labs ordered  previously healthy diet  5. High risk medication use He states he does not have insurance currently but will do lab work later

## 2024-06-03 ENCOUNTER — Other Ambulatory Visit: Payer: Self-pay

## 2024-06-03 ENCOUNTER — Other Ambulatory Visit (HOSPITAL_COMMUNITY): Payer: Self-pay

## 2024-06-04 LAB — DRUG SCREEN, URINE
Amphetamines, Urine: NEGATIVE ng/mL
Barbiturate screen, urine: NEGATIVE ng/mL
Benzodiazepine Quant, Ur: NEGATIVE ng/mL
Cannabinoid Quant, Ur: NEGATIVE ng/mL
Cocaine (Metab.): NEGATIVE ng/mL
Opiate Quant, Ur: POSITIVE ng/mL — AB
PCP Quant, Ur: NEGATIVE ng/mL

## 2024-06-05 ENCOUNTER — Ambulatory Visit: Payer: Self-pay | Admitting: Family Medicine

## 2024-06-13 ENCOUNTER — Other Ambulatory Visit (HOSPITAL_COMMUNITY): Payer: Self-pay

## 2024-06-14 ENCOUNTER — Other Ambulatory Visit (HOSPITAL_COMMUNITY): Payer: Self-pay

## 2024-06-19 ENCOUNTER — Other Ambulatory Visit (HOSPITAL_COMMUNITY): Payer: Self-pay

## 2024-07-11 ENCOUNTER — Other Ambulatory Visit (HOSPITAL_COMMUNITY): Payer: Self-pay

## 2024-07-11 ENCOUNTER — Other Ambulatory Visit: Payer: Self-pay | Admitting: Family Medicine

## 2024-07-14 ENCOUNTER — Other Ambulatory Visit (HOSPITAL_BASED_OUTPATIENT_CLINIC_OR_DEPARTMENT_OTHER): Payer: Self-pay

## 2024-07-14 ENCOUNTER — Other Ambulatory Visit (HOSPITAL_COMMUNITY): Payer: Self-pay

## 2024-07-14 ENCOUNTER — Other Ambulatory Visit: Payer: Self-pay | Admitting: Family Medicine

## 2024-07-14 MED ORDER — ESOMEPRAZOLE MAGNESIUM 40 MG PO CPDR
40.0000 mg | DELAYED_RELEASE_CAPSULE | Freq: Two times a day (BID) | ORAL | 1 refills | Status: AC
  Filled 2024-07-14: qty 60, 30d supply, fill #0

## 2024-08-10 ENCOUNTER — Other Ambulatory Visit (HOSPITAL_COMMUNITY): Payer: Self-pay

## 2024-08-11 ENCOUNTER — Other Ambulatory Visit (HOSPITAL_COMMUNITY): Payer: Self-pay

## 2024-08-16 ENCOUNTER — Other Ambulatory Visit (HOSPITAL_COMMUNITY): Payer: Self-pay

## 2024-09-02 LAB — LIPID PANEL
Chol/HDL Ratio: 2.7 ratio (ref 0.0–5.0)
Cholesterol, Total: 158 mg/dL (ref 100–199)
HDL: 58 mg/dL
LDL Chol Calc (NIH): 75 mg/dL (ref 0–99)
Triglycerides: 143 mg/dL (ref 0–149)
VLDL Cholesterol Cal: 25 mg/dL (ref 5–40)

## 2024-09-02 LAB — CBC WITH DIFFERENTIAL/PLATELET
Basophils Absolute: 0.1 10*3/uL (ref 0.0–0.2)
Basos: 1 %
EOS (ABSOLUTE): 0.1 10*3/uL (ref 0.0–0.4)
Eos: 1 %
Hematocrit: 45.3 % (ref 37.5–51.0)
Hemoglobin: 15.3 g/dL (ref 13.0–17.7)
Immature Grans (Abs): 0 10*3/uL (ref 0.0–0.1)
Immature Granulocytes: 0 %
Lymphocytes Absolute: 2.5 10*3/uL (ref 0.7–3.1)
Lymphs: 29 %
MCH: 31.5 pg (ref 26.6–33.0)
MCHC: 33.8 g/dL (ref 31.5–35.7)
MCV: 93 fL (ref 79–97)
Monocytes Absolute: 0.8 10*3/uL (ref 0.1–0.9)
Monocytes: 9 %
Neutrophils Absolute: 5.2 10*3/uL (ref 1.4–7.0)
Neutrophils: 60 %
Platelets: 224 10*3/uL (ref 150–450)
RBC: 4.86 x10E6/uL (ref 4.14–5.80)
RDW: 13.1 % (ref 11.6–15.4)
WBC: 8.6 10*3/uL (ref 3.4–10.8)

## 2024-09-02 LAB — BASIC METABOLIC PANEL WITH GFR
BUN/Creatinine Ratio: 13 (ref 10–24)
BUN: 16 mg/dL (ref 8–27)
CO2: 23 mmol/L (ref 20–29)
Calcium: 9.8 mg/dL (ref 8.6–10.2)
Chloride: 103 mmol/L (ref 96–106)
Creatinine, Ser: 1.28 mg/dL — ABNORMAL HIGH (ref 0.76–1.27)
Glucose: 87 mg/dL (ref 70–99)
Potassium: 4.4 mmol/L (ref 3.5–5.2)
Sodium: 141 mmol/L (ref 134–144)
eGFR: 63 mL/min/{1.73_m2}

## 2024-09-02 LAB — PSA: Prostate Specific Ag, Serum: 0.4 ng/mL (ref 0.0–4.0)

## 2024-09-02 LAB — URIC ACID: Uric Acid: 5.5 mg/dL (ref 3.8–8.4)

## 2024-09-07 ENCOUNTER — Ambulatory Visit: Payer: Self-pay | Admitting: Family Medicine

## 2024-09-07 ENCOUNTER — Other Ambulatory Visit (HOSPITAL_COMMUNITY): Payer: Self-pay

## 2024-09-07 VITALS — BP 111/80 | HR 95 | Temp 98.1°F | Ht 71.0 in | Wt 255.4 lb

## 2024-09-07 DIAGNOSIS — M1000 Idiopathic gout, unspecified site: Secondary | ICD-10-CM

## 2024-09-07 DIAGNOSIS — E7849 Other hyperlipidemia: Secondary | ICD-10-CM | POA: Diagnosis not present

## 2024-09-07 DIAGNOSIS — M5441 Lumbago with sciatica, right side: Secondary | ICD-10-CM | POA: Diagnosis not present

## 2024-09-07 DIAGNOSIS — Z79891 Long term (current) use of opiate analgesic: Secondary | ICD-10-CM

## 2024-09-07 DIAGNOSIS — G8929 Other chronic pain: Secondary | ICD-10-CM

## 2024-09-07 DIAGNOSIS — M5442 Lumbago with sciatica, left side: Secondary | ICD-10-CM | POA: Diagnosis not present

## 2024-09-07 DIAGNOSIS — Z79899 Other long term (current) drug therapy: Secondary | ICD-10-CM

## 2024-09-07 DIAGNOSIS — I10 Essential (primary) hypertension: Secondary | ICD-10-CM

## 2024-09-07 MED ORDER — ALLOPURINOL 300 MG PO TABS
300.0000 mg | ORAL_TABLET | Freq: Every day | ORAL | 1 refills | Status: AC
  Filled 2024-09-07: qty 90, 90d supply, fill #0

## 2024-09-07 MED ORDER — HYDROCODONE-ACETAMINOPHEN 10-325 MG PO TABS
1.0000 | ORAL_TABLET | ORAL | 0 refills | Status: AC | PRN
  Filled 2024-09-07: qty 150, 25d supply, fill #0

## 2024-09-07 MED ORDER — HYDROCODONE-ACETAMINOPHEN 10-325 MG PO TABS
1.0000 | ORAL_TABLET | ORAL | 0 refills | Status: AC
  Filled 2024-09-07: qty 150, 25d supply, fill #0
  Filled ????-??-??: fill #0

## 2024-09-07 MED ORDER — PREGABALIN 100 MG PO CAPS
100.0000 mg | ORAL_CAPSULE | Freq: Two times a day (BID) | ORAL | 1 refills | Status: AC
  Filled 2024-09-07: qty 180, 90d supply, fill #0

## 2024-09-07 MED ORDER — ATORVASTATIN CALCIUM 40 MG PO TABS
40.0000 mg | ORAL_TABLET | Freq: Every evening | ORAL | 1 refills | Status: AC
  Filled 2024-09-07: qty 90, 90d supply, fill #0

## 2024-09-07 NOTE — Progress Notes (Signed)
 "  Subjective:    Patient ID: Marcus Hayes, male    DOB: [birth year], 64 y.o.   MRN: [id number]  HPI  Room 6  Pt is here for pain management in left knee and back This patient was seen today for chronic pain  The medication list was reviewed and updated.   Location of Pain for which the patient has been treated with regarding narcotics: Back pain knee pain sciatica pain  Onset of this pain: Present for years   -Compliance with medication: Good compliance  - Number patient states they take daily: 4 or 5 daily  -Reason for ongoing use of opioids has had surgery and has had injections unfortunately has chronic pain discomfort  What other measures have been tried outside of opioids surgery, physical therapy, injections, Lyrica   In the ongoing specialists regarding this condition orthopedic, back surgeon  -when was the last dose patient took?  Past 24 hours  The patient was advised the importance of maintaining medication and not using illegal substances with these.  Here for refills and follow up  The patient was educated that we can provide 3 monthly scripts for their medication, it is their responsibility to follow the instructions.  Side effects or complications from medications: Denies side effects  Patient is aware that pain medications are meant to minimize the severity of the pain to allow their pain levels to improve to allow for better function. They are aware of that pain medications cannot totally remove their pain.  Due for UDT ( at least once per year) (pain management contract is also completed at the time of the UDT): Fall 2025  Scale of 1 to 10 ( 1 is least 10 is most) Your pain level without the medicine: 8-9 Your pain level with medication 6-7  Scale 1 to 10 ( 1-helps very little, 10 helps very well) How well does your pain medication reduce your pain so you can function better through out the day?  7-8  Quality of the pain: Throbbing  aching  Persistence of the pain: All the time  Modifying factors: Worse with activity  Results for orders placed or performed in visit on 06/02/24  Drug Screen, Urine   Collection Time: 06/02/24  4:38 PM  Result Value Ref Range   Amphetamines, Urine Negative Cutoff=1000 ng/mL   Barbiturate screen, urine Negative Cutoff=200 ng/mL   Benzodiazepine Quant, Ur Negative Cutoff=300 ng/mL   Cannabinoid Quant, Ur Negative Cutoff=50 ng/mL   Cocaine (Metab.) Negative Cutoff=300 ng/mL   Opiate Quant, Ur Positive (A) Cutoff=300 ng/mL   PCP Quant, Ur Negative Cutoff=25 ng/mL   Drug Screen Comment: Comment   PSA   Collection Time: 08/31/24 11:40 AM  Result Value Ref Range   Prostate Specific Ag, Serum 0.4 0.0 - 4.0 ng/mL  Lipid Profile   Collection Time: 08/31/24 11:40 AM  Result Value Ref Range   Cholesterol, Total 158 100 - 199 mg/dL   Triglycerides 856 0 - 149 mg/dL   HDL 58 >60 mg/dL   VLDL Cholesterol Cal 25 5 - 40 mg/dL   LDL Chol Calc (NIH) 75 0 - 99 mg/dL   Chol/HDL Ratio 2.7 0.0 - 5.0 ratio  Basic Metabolic Panel (BMET)   Collection Time: 08/31/24 11:40 AM  Result Value Ref Range   Glucose 87 70 - 99 mg/dL   BUN 16 8 - 27 mg/dL   Creatinine, Ser 8.71 (H) 0.76 - 1.27 mg/dL   eGFR 63 >40 fO/fpw/8.26   BUN/Creatinine Ratio 13  10 - 24   Sodium 141 134 - 144 mmol/L   Potassium 4.4 3.5 - 5.2 mmol/L   Chloride 103 96 - 106 mmol/L   CO2 23 20 - 29 mmol/L   Calcium  9.8 8.6 - 10.2 mg/dL  Uric acid   Collection Time: 08/31/24 11:40 AM  Result Value Ref Range   Uric Acid 5.5 3.8 - 8.4 mg/dL  CBC with Differential   Collection Time: 08/31/24 11:40 AM  Result Value Ref Range   WBC 8.6 3.4 - 10.8 x10E3/uL   RBC 4.86 4.14 - 5.80 x10E6/uL   Hemoglobin 15.3 13.0 - 17.7 g/dL   Hematocrit 54.6 62.4 - 51.0 %   MCV 93 79 - 97 fL   MCH 31.5 26.6 - 33.0 pg   MCHC 33.8 31.5 - 35.7 g/dL   RDW 86.8 88.3 - 84.5 %   Platelets 224 150 - 450 x10E3/uL   Neutrophils 60 Not Estab. %   Lymphs 29  Not Estab. %   Monocytes 9 Not Estab. %   Eos 1 Not Estab. %   Basos 1 Not Estab. %   Neutrophils Absolute 5.2 1.4 - 7.0 x10E3/uL   Lymphocytes Absolute 2.5 0.7 - 3.1 x10E3/uL   Monocytes Absolute 0.8 0.1 - 0.9 x10E3/uL   EOS (ABSOLUTE) 0.1 0.0 - 0.4 x10E3/uL   Basophils Absolute 0.1 0.0 - 0.2 x10E3/uL   Immature Granulocytes 0 Not Estab. %   Immature Grans (Abs) 0.0 0.0 - 0.1 x10E3/uL   Labs were reviewed in detail with patient  Discussed the use of AI scribe software for clinical note transcription with the patient, who gave verbal consent to proceed.  History of Present Illness   Marcus Hayes is a 64 year old male who presents with chronic pain management.  He experiences persistent pain primarily in the buttocks, radiating down his legs. The pain is constant and unrelenting, described as 'it hurts all the time' and 'it don't stop.' He is currently on pregabalin  (Lyrica ) 50 mg twice a day, which provides slight relief but is not sufficient for effective pain management. He has about 20 to 30 pills of the current dosage at home. He recalls a previous nerve injection procedure around October or November of the previous year, which resulted in significant discomfort and increased pain in his buttocks and legs. He takes pain medication approximately four to five times a day, providing minimal relief. He last saw a specialist for his back issues around October or November, where he received a shot that provided temporary relief.  He manages other health conditions, including cholesterol, gout, and heartburn. He takes his cholesterol and gout medications regularly and reports no recent gout flare-ups. His heartburn is controlled with medication, which he takes as needed.  He is the primary caregiver for his wife, who requires assistance with catheterization and has significant health needs. He reports feeling stressed and overwhelmed by the constant demands of caregiving for his wife,  and notes that he does not receive additional support.  He has been unemployed for over three years and is in the process of applying for disability benefits, with a hearing scheduled for the 17th of this month.         Pt states pain is about an 8/10   Review of Systems     Objective:   Physical Exam General-in no acute distress Eyes-no discharge Lungs-respiratory rate normal, CTA CV-no murmurs,RRR Extremities skin warm dry no edema Neuro grossly normal Behavior normal, alert  I talked at length with the patient on how he is doing he denies being depressed he states he is not suicidal he is not thinking about hurting himself      Assessment & Plan:  Assessment and Plan    Chronic pain syndrome Persistent buttock pain radiating to legs. Minimal relief with current pregabalin  dose. Goal: manage pain, not eliminate. - Increase pregabalin  to 50 mg three times daily for one week, then 100 mg twice daily if tolerated. - Monitor for adverse effects and efficacy. - Continue current pain medication regimen.  Hyperlipidemia Cholesterol levels improved. LDL near 75 mg/dL, HDL 58 mg/dL. - Continue current cholesterol medication regimen.  Gout No recent flare-ups. - Continue current gout medication regimen.  Gastroesophageal reflux disease Heartburn well-controlled. - Continue current heartburn medication regimen.  Elevated kidney function not enough to call this chronic kidney disease Slightly elevated kidney function tests, likely due to inadequate fluid intake. - Increase fluid intake to ensure urine is light yellow. - Re-evaluate kidney function with blood and urine tests in three months.  Obesity-associated with his blood pressure and his cholesterol Interest in weight loss medications. Current options not covered by insurance for non-diabetics. Discussed potential future Medicare coverage. - Investigate insurance coverage for weight loss medications. - Encouraged  dietary modifications to reduce portion sizes. "

## 2024-09-09 ENCOUNTER — Other Ambulatory Visit (HOSPITAL_COMMUNITY): Payer: Self-pay

## 2024-12-05 ENCOUNTER — Ambulatory Visit: Admitting: Family Medicine

## 2025-03-09 ENCOUNTER — Ambulatory Visit: Admitting: Family Medicine
